# Patient Record
Sex: Female | Born: 1950 | Race: White | Hispanic: No | Marital: Married | Smoking: Never smoker
Health system: Southern US, Community
[De-identification: ages and names within clinical notes are randomized; demographics above are authoritative.]

## PROBLEM LIST (undated history)

## (undated) DIAGNOSIS — H269 Unspecified cataract: Secondary | ICD-10-CM

## (undated) DIAGNOSIS — N8110 Cystocele, unspecified: Secondary | ICD-10-CM

## (undated) DIAGNOSIS — E785 Hyperlipidemia, unspecified: Secondary | ICD-10-CM

## (undated) DIAGNOSIS — I10 Essential (primary) hypertension: Secondary | ICD-10-CM

## (undated) DIAGNOSIS — M1990 Unspecified osteoarthritis, unspecified site: Secondary | ICD-10-CM

## (undated) DIAGNOSIS — Z8489 Family history of other specified conditions: Secondary | ICD-10-CM

## (undated) DIAGNOSIS — H35039 Hypertensive retinopathy, unspecified eye: Secondary | ICD-10-CM

## (undated) DIAGNOSIS — H35341 Macular cyst, hole, or pseudohole, right eye: Secondary | ICD-10-CM

## (undated) HISTORY — DX: Macular cyst, hole, or pseudohole, right eye: H35.341

## (undated) HISTORY — DX: Hypertensive retinopathy, unspecified eye: H35.039

## (undated) HISTORY — DX: Unspecified cataract: H26.9

## (undated) HISTORY — DX: Unspecified osteoarthritis, unspecified site: M19.90

## (undated) HISTORY — PX: CATARACT EXTRACTION: SUR2

## (undated) HISTORY — PX: CHOLECYSTECTOMY: SHX55

## (undated) HISTORY — PX: TUBAL LIGATION: SHX77

## (undated) HISTORY — PX: GALLBLADDER SURGERY: SHX652

## (undated) HISTORY — DX: Cystocele, unspecified: N81.10

## (undated) HISTORY — DX: Hyperlipidemia, unspecified: E78.5

## (undated) HISTORY — DX: Essential (primary) hypertension: I10

---

## 2003-08-29 ENCOUNTER — Ambulatory Visit (HOSPITAL_COMMUNITY): Admission: RE | Admit: 2003-08-29 | Discharge: 2003-08-29 | Payer: Self-pay | Admitting: Family Medicine

## 2004-01-30 ENCOUNTER — Ambulatory Visit: Payer: Self-pay | Admitting: Family Medicine

## 2004-10-07 ENCOUNTER — Other Ambulatory Visit: Admission: RE | Admit: 2004-10-07 | Discharge: 2004-10-07 | Payer: Self-pay | Admitting: Dermatology

## 2004-11-05 ENCOUNTER — Ambulatory Visit: Payer: Self-pay | Admitting: Family Medicine

## 2004-11-05 ENCOUNTER — Other Ambulatory Visit: Admission: RE | Admit: 2004-11-05 | Discharge: 2004-11-05 | Payer: Self-pay | Admitting: Dermatology

## 2004-12-31 ENCOUNTER — Other Ambulatory Visit: Admission: RE | Admit: 2004-12-31 | Discharge: 2004-12-31 | Payer: Self-pay | Admitting: Dermatology

## 2005-03-17 ENCOUNTER — Ambulatory Visit: Payer: Self-pay | Admitting: Family Medicine

## 2005-07-22 ENCOUNTER — Ambulatory Visit: Payer: Self-pay | Admitting: Family Medicine

## 2005-12-01 ENCOUNTER — Ambulatory Visit: Payer: Self-pay | Admitting: Family Medicine

## 2005-12-17 ENCOUNTER — Ambulatory Visit (HOSPITAL_COMMUNITY): Admission: RE | Admit: 2005-12-17 | Discharge: 2005-12-17 | Payer: Self-pay | Admitting: Family Medicine

## 2006-01-06 ENCOUNTER — Ambulatory Visit (HOSPITAL_COMMUNITY): Admission: RE | Admit: 2006-01-06 | Discharge: 2006-01-06 | Payer: Self-pay | Admitting: Family Medicine

## 2006-04-05 ENCOUNTER — Ambulatory Visit: Payer: Self-pay | Admitting: Family Medicine

## 2006-04-23 ENCOUNTER — Ambulatory Visit: Payer: Self-pay | Admitting: Family Medicine

## 2007-01-20 ENCOUNTER — Ambulatory Visit (HOSPITAL_COMMUNITY): Admission: RE | Admit: 2007-01-20 | Discharge: 2007-01-20 | Payer: Self-pay | Admitting: Family Medicine

## 2009-02-04 ENCOUNTER — Encounter: Payer: Self-pay | Admitting: Family Medicine

## 2009-02-04 ENCOUNTER — Ambulatory Visit (HOSPITAL_COMMUNITY): Admission: RE | Admit: 2009-02-04 | Discharge: 2009-02-04 | Payer: Self-pay | Admitting: Family Medicine

## 2010-03-02 ENCOUNTER — Encounter: Payer: Self-pay | Admitting: Family Medicine

## 2010-12-09 ENCOUNTER — Other Ambulatory Visit (HOSPITAL_COMMUNITY): Payer: Self-pay | Admitting: Family Medicine

## 2010-12-09 DIAGNOSIS — Z139 Encounter for screening, unspecified: Secondary | ICD-10-CM

## 2010-12-16 ENCOUNTER — Ambulatory Visit (HOSPITAL_COMMUNITY)
Admission: RE | Admit: 2010-12-16 | Discharge: 2010-12-16 | Disposition: A | Discharge status: Home / Self Care | Payer: BC Managed Care – PPO | Source: Ambulatory Visit | Attending: Family Medicine | Admitting: Family Medicine

## 2010-12-16 DIAGNOSIS — Z1231 Encounter for screening mammogram for malignant neoplasm of breast: Secondary | ICD-10-CM | POA: Insufficient documentation

## 2010-12-16 DIAGNOSIS — Z139 Encounter for screening, unspecified: Secondary | ICD-10-CM

## 2012-06-09 DEATH — deceased

## 2013-08-25 ENCOUNTER — Other Ambulatory Visit (HOSPITAL_COMMUNITY): Payer: Self-pay | Admitting: Family Medicine

## 2013-08-25 DIAGNOSIS — Z1231 Encounter for screening mammogram for malignant neoplasm of breast: Secondary | ICD-10-CM

## 2013-08-29 ENCOUNTER — Other Ambulatory Visit (HOSPITAL_COMMUNITY): Payer: Self-pay | Admitting: Family Medicine

## 2013-08-29 ENCOUNTER — Ambulatory Visit (HOSPITAL_COMMUNITY): Payer: BC Managed Care – PPO

## 2013-08-29 DIAGNOSIS — Z1231 Encounter for screening mammogram for malignant neoplasm of breast: Secondary | ICD-10-CM

## 2013-08-30 ENCOUNTER — Ambulatory Visit (HOSPITAL_COMMUNITY)
Admission: RE | Admit: 2013-08-30 | Discharge: 2013-08-30 | Disposition: A | Discharge status: Home / Self Care | Payer: BC Managed Care – PPO | Source: Ambulatory Visit | Attending: Family Medicine | Admitting: Family Medicine

## 2013-08-30 DIAGNOSIS — Z1231 Encounter for screening mammogram for malignant neoplasm of breast: Secondary | ICD-10-CM | POA: Insufficient documentation

## 2014-01-23 ENCOUNTER — Ambulatory Visit (INDEPENDENT_AMBULATORY_CARE_PROVIDER_SITE_OTHER): Payer: Self-pay | Admitting: Family Medicine

## 2014-01-23 DIAGNOSIS — Z139 Encounter for screening, unspecified: Secondary | ICD-10-CM

## 2014-01-23 DIAGNOSIS — Z024 Encounter for examination for driving license: Secondary | ICD-10-CM

## 2014-01-23 LAB — POCT URINALYSIS DIPSTICK
Bilirubin, UA: NEGATIVE
Blood, UA: NEGATIVE
Glucose, UA: NEGATIVE
Ketones, UA: NEGATIVE
Leukocytes, UA: NEGATIVE
Nitrite, UA: NEGATIVE
Protein, UA: NEGATIVE
Spec Grav, UA: 1.02
Urobilinogen, UA: NEGATIVE
pH, UA: 5

## 2014-01-23 NOTE — Progress Notes (Signed)
   Subjective:    Patient ID: Catherine Carney, female    DOB: 1950-05-01, 63 y.o.   MRN: [id number]  HPI Patient is here for private DOT PE.  She has hx of hypertension  Review of Systems  Constitutional: Negative for fever.  HENT: Negative for ear pain.   Eyes: Negative for discharge.  Respiratory: Negative for cough.   Cardiovascular: Negative for chest pain.  Gastrointestinal: Negative for abdominal distention.  Endocrine: Negative for polyuria.  Genitourinary: Negative for difficulty urinating.  Musculoskeletal: Negative for gait problem and neck pain.  Skin: Negative for color change and rash.  Neurological: Negative for speech difficulty and headaches.  Psychiatric/Behavioral: Negative for agitation.       Objective:    There were no vitals taken for this visit. Physical Exam  Constitutional: She is oriented to person, place, and time. She appears well-developed and well-nourished.  HENT:  Head: Normocephalic and atraumatic.  Mouth/Throat: Oropharynx is clear and moist.  Eyes: Pupils are equal, round, and reactive to light.  Neck: Normal range of motion. Neck supple.  Cardiovascular: Normal rate and regular rhythm.   No murmur heard. Pulmonary/Chest: Effort normal and breath sounds normal.  Abdominal: Soft. Bowel sounds are normal. There is no tenderness.  Neurological: She is alert and oriented to person, place, and time.  Skin: Skin is warm and dry.  Psychiatric: She has a normal mood and affect.          Assessment & Plan:     ICD-9-CM ICD-10-CM   1. Screening V82.9 Z13.9 POCT urinalysis dipstick   3 month temporary card given until bp tx'd and bp under 140/90  No Follow-up on file.  Lysbeth Penner FNP

## 2014-04-16 ENCOUNTER — Encounter: Payer: Self-pay | Admitting: Nurse Practitioner

## 2014-04-16 ENCOUNTER — Ambulatory Visit: Payer: Self-pay | Admitting: Nurse Practitioner

## 2014-04-16 VITALS — BP 139/83 | HR 98 | Ht 66.0 in | Wt 204.0 lb

## 2014-04-16 DIAGNOSIS — Z024 Encounter for examination for driving license: Secondary | ICD-10-CM

## 2014-04-16 NOTE — Progress Notes (Signed)
DOT physical- see scanned in report   [place], FNP

## 2014-07-23 ENCOUNTER — Other Ambulatory Visit (HOSPITAL_COMMUNITY): Payer: Self-pay | Admitting: Family Medicine

## 2014-07-23 DIAGNOSIS — Z1231 Encounter for screening mammogram for malignant neoplasm of breast: Secondary | ICD-10-CM

## 2014-09-03 ENCOUNTER — Ambulatory Visit (HOSPITAL_COMMUNITY)
Admission: RE | Admit: 2014-09-03 | Discharge: 2014-09-03 | Disposition: A | Discharge status: Home / Self Care | Payer: BLUE CROSS/BLUE SHIELD | Source: Ambulatory Visit | Attending: Family Medicine | Admitting: Family Medicine

## 2014-09-03 DIAGNOSIS — Z1231 Encounter for screening mammogram for malignant neoplasm of breast: Secondary | ICD-10-CM

## 2014-09-20 ENCOUNTER — Other Ambulatory Visit (HOSPITAL_COMMUNITY): Payer: Self-pay | Admitting: Family Medicine

## 2014-09-20 DIAGNOSIS — M810 Age-related osteoporosis without current pathological fracture: Secondary | ICD-10-CM

## 2014-09-24 ENCOUNTER — Other Ambulatory Visit (HOSPITAL_COMMUNITY): Payer: BLUE CROSS/BLUE SHIELD

## 2014-10-11 ENCOUNTER — Ambulatory Visit (HOSPITAL_COMMUNITY)
Admission: RE | Admit: 2014-10-11 | Discharge: 2014-10-11 | Disposition: A | Discharge status: Home / Self Care | Payer: BLUE CROSS/BLUE SHIELD | Source: Ambulatory Visit | Attending: Family Medicine | Admitting: Family Medicine

## 2014-10-11 DIAGNOSIS — Z780 Asymptomatic menopausal state: Secondary | ICD-10-CM | POA: Diagnosis not present

## 2014-10-11 DIAGNOSIS — M8580 Other specified disorders of bone density and structure, unspecified site: Secondary | ICD-10-CM | POA: Diagnosis present

## 2014-10-11 DIAGNOSIS — M810 Age-related osteoporosis without current pathological fracture: Secondary | ICD-10-CM

## 2014-10-11 DIAGNOSIS — M8588 Other specified disorders of bone density and structure, other site: Secondary | ICD-10-CM | POA: Insufficient documentation

## 2014-11-06 ENCOUNTER — Other Ambulatory Visit (HOSPITAL_COMMUNITY): Payer: Self-pay | Admitting: Physician Assistant

## 2014-11-06 DIAGNOSIS — Z1231 Encounter for screening mammogram for malignant neoplasm of breast: Secondary | ICD-10-CM

## 2014-11-15 ENCOUNTER — Ambulatory Visit (HOSPITAL_COMMUNITY): Payer: BLUE CROSS/BLUE SHIELD

## 2015-04-09 ENCOUNTER — Encounter: Payer: Self-pay | Admitting: Family Medicine

## 2015-04-09 ENCOUNTER — Ambulatory Visit: Payer: Self-pay | Admitting: Family Medicine

## 2015-04-09 VITALS — BP 136/73 | HR 86 | Temp 97.8°F | Ht 66.0 in | Wt 193.4 lb

## 2015-04-09 DIAGNOSIS — Z139 Encounter for screening, unspecified: Secondary | ICD-10-CM

## 2015-04-09 DIAGNOSIS — Z024 Encounter for examination for driving license: Secondary | ICD-10-CM

## 2015-04-09 LAB — POCT URINALYSIS DIPSTICK
Bilirubin, UA: NEGATIVE
Blood, UA: NEGATIVE
GLUCOSE UA: NEGATIVE
KETONES UA: NEGATIVE
LEUKOCYTES UA: NEGATIVE
Nitrite, UA: NEGATIVE
PROTEIN UA: NEGATIVE
Spec Grav, UA: <=1.005
Urobilinogen, UA: NEGATIVE
pH, UA: 5

## 2015-04-09 NOTE — Progress Notes (Signed)
Subjective:  Patient ID: Catherine Carney, female    DOB: 02/01/1951  Age: 65 y.o. MRN: [id number]  CC: DOT PE   HPI Catherine Carney presents for DOT exam   History Adam has no past medical history on file.   She has no past surgical history on file.   Her family history is not on file.She reports that she has never smoked. She does not have any smokeless tobacco history on file. Her alcohol and drug histories are not on file.    ROS Review of Systems  Constitutional: Negative for fever, chills, diaphoresis, appetite change, fatigue and unexpected weight change.  HENT: Negative for congestion, ear pain, hearing loss, postnasal drip, rhinorrhea, sneezing, sore throat and trouble swallowing.   Eyes: Negative for pain.  Respiratory: Negative for cough, chest tightness and shortness of breath.   Cardiovascular: Negative for chest pain and palpitations.  Gastrointestinal: Negative for nausea, vomiting, abdominal pain, diarrhea and constipation.  Endocrine: Negative for cold intolerance, heat intolerance, polydipsia, polyphagia and polyuria.  Genitourinary: Negative for dysuria, frequency and menstrual problem.  Musculoskeletal: Negative for joint swelling and arthralgias.  Skin: Negative for rash.  Allergic/Immunologic: Negative for environmental allergies.  Neurological: Negative for dizziness, weakness, numbness and headaches.  Psychiatric/Behavioral: Negative for dysphoric mood and agitation.    Objective:  BP 136/73 mmHg  Pulse 86  Temp(Src) 97.8 F (36.6 C) (Oral)  Ht 5\' 6"  (1.676 m)  Wt 193 lb 6.4 oz (87.726 kg)  BMI 31.23 kg/m2  SpO2 99%  BP Readings from Last 3 Encounters:  04/09/15 136/73  04/16/14 139/83    Wt Readings from Last 3 Encounters:  04/09/15 193 lb 6.4 oz (87.726 kg)  04/16/14 204 lb (92.534 kg)     Physical Exam  Constitutional: She is oriented to person, place, and time. She appears well-developed and well-nourished. No distress.  HENT:    Head: Normocephalic and atraumatic.  Right Ear: External ear normal.  Left Ear: External ear normal.  Nose: Nose normal.  Mouth/Throat: Oropharynx is clear and moist.  Eyes: Conjunctivae and EOM are normal. Pupils are equal, round, and reactive to light.  Neck: Normal range of motion. Neck supple. No thyromegaly present.  Cardiovascular: Normal rate, regular rhythm and normal heart sounds.   No murmur heard. Pulmonary/Chest: Effort normal and breath sounds normal. No respiratory distress. She has no wheezes. She has no rales.  Abdominal: Soft. Bowel sounds are normal. She exhibits no distension. There is no tenderness.  Lymphadenopathy:    She has no cervical adenopathy.  Neurological: She is alert and oriented to person, place, and time. She has normal reflexes.  Skin: Skin is warm and dry.  Psychiatric: She has a normal mood and affect. Her behavior is normal. Judgment and thought content normal.     No results found for: WBC, HGB, HCT, PLT, GLUCOSE, CHOL, TRIG, HDL, LDLDIRECT, LDLCALC, ALT, AST, NA, K, CL, CREATININE, BUN, CO2, TSH, PSA, INR, GLUF, HGBA1C, MICROALBUR  Dg Bone Density  10/11/2014  EXAM: DUAL X-RAY ABSORPTIOMETRY (DXA) FOR BONE MINERAL DENSITY IMPRESSION: Ordering Physician:  Dr. Dione Housekeeper, Your patient Catherine Carney completed a BMD test on 10/11/2014 using the [place] (software version: 14.10) manufactured by [employer]. The following summarizes the results of our evaluation. PATIENT BIOGRAPHICAL: Name: Catherine Carney, Catherine Carney Patient ID: [id number] Birth Date: 1950/12/01 Height: 65.5 in. Gender: Female Exam Date: 10/11/2014 Weight: 197.0 lbs. Indications: Caucasian, Follow up Osteopenia, Height Loss, History of Fracture (Adult), Parent  Hip Fracture, Post Menopausal Fractures: Wrist Treatments: Vitamin D, Calcium DENSITOMETRY RESULTS: Site      Region    Measured Date Measured Age WHO Classification Young Adult T-score BMD         %Change vs. Previous  Significant Change (*) AP Spine L1-L2 10/11/2014 66.4 Normal -0.6 1.098 g/cm2 20.7% Yes AP Spine L1-L2 02/04/2009 60.7 Osteopenia -2.1 0.910 g/cm2 -8.6% Yes AP Spine L1-L2 08/29/2003 55.3 Osteopenia -1.4 0.996 g/cm2 - - DualFemur Neck Left 10/11/2014 66.4 Osteopenia -1.8 0.785 g/cm2 -1.6% - DualFemur Neck Left 02/04/2009 60.7 Osteopenia -1.7 0.798 g/cm2 1.1% - DualFemur Neck Left 08/29/2003 55.3 Osteopenia -1.8 0.789 g/cm2 - - ASSESSMENT: BMD as determined from Femur Neck Left is 0.785 g/cm2 with a T-Score of -1.8. This patient is considered osteopenic according to [facility]) criteria. Compared with the prior study on 02/04/2009, the BMD of the lumbar spine shows a statistically significant increase. Compared with the prior study on 02/04/2009, the BMD of the left femoral neck shows no statistically significant change. (L-3-4 was excluded due to advanced degenerative changes.) (Patient does not meet criteria for FRAX assessment.) World Health Organization [facility]) criteria for post-menopausal, Caucasian Women: Normal:       T-score at or above -1 SD Osteopenia:   T-score between -1 and -2.5 SD Osteoporosis: T-score at or below -2.5 SD RECOMMENDATIONS: [place] recommends that FDA-approved medial therapies be considered in postmenopausal women and men age 74 or older with a: 1. Hip or vertebral (clinical or morphometric) fracture. 2. T-Score of < -2.5 at the spine or hip. 3. Ten-year fracture probability by FRAX of 3% or greater for hip fracture or 20% or greater for major osteoporotic fracture. All treatment decisions require clinical judgment and consideration of individual patient factors, including patient preferences, co-morbidities, previous drug use, risk factors not captured in the FRAX model (e.g. falls, vitamin D deficiency, increased bone turnover, interval significant decline in bone density) and possible under-or over-estimation of fracture risk by FRAX. All  patients should ensure an adequate intake of dietary calcium (1200 mg/d) and vitamin D (800 IU daily) unless contraindicated. FOLLOW-UP: People with diagnosed cases of osteoporosis or osteopenia should be regularly tested for bone mineral density. For patients eligible for Medicare, routine testing is allowed once every 2 years. Testing frequency can be increased for patients who have rapidly progressing disease, or for those who are receiving medical therapy to restore bone mass. I have reviewed this report, and agree with the above findings. [facility] Electronically Signed   By: David  Martinique M.D.   On: 10/11/2014 10:38    Assessment & Plan:   Dariona was seen today for dot pe.  Diagnoses and all orders for this visit:  Encounter for commercial driver medical examination (CDME)  Screening -     POCT urinalysis dipstick      I have discontinued Ms. Kienle's alendronate and PRENAT-FECBN-FEBISG-FA-FISHOIL PO. I am also having her maintain her amLODipine, aspirin, Calcium-Magnesium-Vitamin D, lisinopril, THERA, and Pitavastatin Calcium.  No orders of the defined types were placed in this encounter.     Follow-up: No Follow-up on file.  Claretta Fraise, M.D.

## 2016-01-23 ENCOUNTER — Other Ambulatory Visit (HOSPITAL_COMMUNITY): Payer: Self-pay | Admitting: Family Medicine

## 2016-01-23 DIAGNOSIS — Z1231 Encounter for screening mammogram for malignant neoplasm of breast: Secondary | ICD-10-CM

## 2016-01-30 ENCOUNTER — Ambulatory Visit (HOSPITAL_COMMUNITY)
Admission: RE | Admit: 2016-01-30 | Discharge: 2016-01-30 | Disposition: A | Discharge status: Home / Self Care | Payer: BLUE CROSS/BLUE SHIELD | Source: Ambulatory Visit | Attending: Family Medicine | Admitting: Family Medicine

## 2016-01-30 DIAGNOSIS — Z1231 Encounter for screening mammogram for malignant neoplasm of breast: Secondary | ICD-10-CM | POA: Insufficient documentation

## 2016-04-02 ENCOUNTER — Encounter: Payer: Self-pay | Admitting: Family Medicine

## 2016-04-02 ENCOUNTER — Ambulatory Visit (INDEPENDENT_AMBULATORY_CARE_PROVIDER_SITE_OTHER): Payer: Self-pay | Admitting: Family Medicine

## 2016-04-02 VITALS — BP 137/73 | HR 96 | Ht 65.0 in | Wt 191.0 lb

## 2016-04-02 DIAGNOSIS — Z024 Encounter for examination for driving license: Secondary | ICD-10-CM

## 2016-04-02 NOTE — Progress Notes (Signed)
Subjective:  Patient ID: Catherine Carney, female    DOB: Jun 24, 1950  Age: 66 y.o. 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   She has no past surgical history on file.   Her family history is not on file.She reports that she has never smoked. She does not have any smokeless tobacco history on file. Her alcohol and drug histories are not on file.    ROS Review of Systems  Constitutional: Negative for activity change, appetite change and fever.  HENT: Negative for congestion, rhinorrhea and sore throat.   Eyes: Negative for visual disturbance.  Respiratory: Negative for cough and shortness of breath.   Cardiovascular: Negative for chest pain and palpitations.  Gastrointestinal: Negative for abdominal pain, diarrhea and nausea.  Genitourinary: Negative for dysuria.  Musculoskeletal: Negative for arthralgias and myalgias.    Objective:  BP 137/73   Pulse 96   Ht 5\' 5"  (1.651 m)   Wt 191 lb (86.6 kg)   BMI 31.78 kg/m   BP Readings from Last 3 Encounters:  04/02/16 137/73  04/09/15 136/73  04/16/14 139/83    Wt Readings from Last 3 Encounters:  04/02/16 191 lb (86.6 kg)  04/09/15 193 lb 6.4 oz (87.7 kg)  04/16/14 204 lb (92.5 kg)     Physical Exam  Constitutional: She is oriented to person, place, and time. She appears well-developed and well-nourished. No distress.  HENT:  Head: Normocephalic and atraumatic.  Eyes: Conjunctivae are normal. Pupils are equal, round, and reactive to light.  Neck: Normal range of motion. Neck supple. No thyromegaly present.  Cardiovascular: Normal rate, regular rhythm and normal heart sounds.   No murmur heard. Pulmonary/Chest: Effort normal and breath sounds normal. No respiratory distress. She has no wheezes. She has no rales.  Abdominal: Soft. Bowel sounds are normal. She exhibits no distension. There is no  tenderness.  Musculoskeletal: Normal range of motion.  Lymphadenopathy:    She has no cervical adenopathy.  Neurological: She is alert and oriented to person, place, and time.  Skin: Skin is warm and dry.  Psychiatric: She has a normal mood and affect. Her behavior is normal. Judgment and thought content normal.    Mm Screening Breast Tomo Bilateral  Result Date: 01/30/2016 CLINICAL DATA:  Screening. EXAM: 2D DIGITAL SCREENING BILATERAL MAMMOGRAM WITH CAD AND ADJUNCT TOMO COMPARISON:  Previous exam(s). ACR Breast Density Category c: The breast tissue is heterogeneously dense, which may obscure small masses. FINDINGS: There are no findings suspicious for malignancy. Images were processed with CAD. IMPRESSION: No mammographic evidence of malignancy. A result letter of this screening mammogram will be mailed directly to the patient. RECOMMENDATION: Screening mammogram in one year. (Code:SM-B-01Y) BI-RADS CATEGORY  1: Negative. Electronically Signed   By: Nolon Nations M.D.   On: 02/05/2016 08:45    Assessment & Plan:   Catherine Carney was seen today for private dot physical.  Diagnoses and all orders for this visit:  Encounter for commercial driver medical examination (CDME)      I am having Catherine Carney maintain her amLODipine, aspirin, Calcium-Magnesium-Vitamin D, lisinopril, THERA, and Pitavastatin Calcium.  Allergies as of 04/02/2016   No Known Allergies     Medication List       Accurate as of 04/02/16  6:04 PM. Always use your most recent med list.  amLODipine 5 MG tablet Commonly known as:  NORVASC TAKE 1 TABLET DAILY   aspirin 81 MG chewable tablet Chew 81 mg by mouth.   Calcium-Magnesium-Vitamin D 600-40-500 MG-MG-UNIT Tb24 Take by mouth.   lisinopril 10 MG tablet Commonly known as:  PRINIVIL,ZESTRIL Take 10 mg by mouth.   LIVALO 2 MG Tabs Generic drug:  Pitavastatin Calcium Take 1 tablet by mouth.   THERA Tabs Take 1 tablet by mouth.         Follow-up: Return in about 1 year (around 04/02/2017).  Catherine Carney, M.D.

## 2016-10-02 ENCOUNTER — Encounter: Payer: Self-pay | Admitting: *Deleted

## 2016-10-06 ENCOUNTER — Encounter (INDEPENDENT_AMBULATORY_CARE_PROVIDER_SITE_OTHER): Payer: Self-pay

## 2016-10-06 ENCOUNTER — Encounter: Payer: Self-pay | Admitting: Obstetrics & Gynecology

## 2016-10-06 ENCOUNTER — Ambulatory Visit (INDEPENDENT_AMBULATORY_CARE_PROVIDER_SITE_OTHER): Payer: Medicare HMO | Admitting: Obstetrics & Gynecology

## 2016-10-06 VITALS — BP 130/70 | HR 84 | Ht 66.0 in | Wt 192.5 lb

## 2016-10-06 DIAGNOSIS — N813 Complete uterovaginal prolapse: Secondary | ICD-10-CM

## 2016-10-06 NOTE — Progress Notes (Signed)
Chief Complaint  Patient presents with  . feels like bladder has dropped    Blood pressure 130/70, pulse 84, height 5\' 6"  (1.676 m), weight 192 lb 8 oz (87.3 kg).  66 y.o. G3P3003 No LMP recorded. Patient is postmenopausal. The current method of family planning is post menopausal status.  Outpatient Encounter Prescriptions as of 10/06/2016  Medication Sig Note  . amLODipine (NORVASC) 5 MG tablet Take 10 mg by mouth daily. TAKE 1 TABLET DAILY 01/23/2014: Received from: [facility]  . aspirin 81 MG chewable tablet Chew 81 mg by mouth daily.  01/23/2014: Received from: [place]  . CALCIUM PO Take by mouth daily.   Marland Kitchen lisinopril (PRINIVIL,ZESTRIL) 10 MG tablet Take 20 mg by mouth daily.  01/23/2014: Received from: [facility]  . Multiple Vitamin (THERA) TABS Take 1 tablet by mouth daily.  01/23/2014: Received from: [facility]  . Omega-3 Fatty Acids (FISH OIL PO) Take 1,000 mg by mouth daily.   . Pitavastatin Calcium (LIVALO) 2 MG TABS Take 1 tablet by mouth daily.  01/23/2014: Received from: [place]  . vitamin B-12 (CYANOCOBALAMIN) 100 MCG tablet Take 100 mcg by mouth daily.   . [DISCONTINUED] Calcium-Magnesium-Vitamin D 614-43-154 MG-MG-UNIT TB24 Take by mouth. 01/23/2014: Received from: [place]   No facility-administered encounter medications on file as of 10/06/2016.     Subjective Catherine Carney comes in stating feels like her bladder has dropped This is been going on for some time weeks to months but she is basically been ignoring it is best she can It certainly is worse with movement and straining She has no urinary incontinence She denies any bleeding Nothing seems to make it worse or better   Objective Blood pressure 130/70, pulse 84, height 5\' 6"  (1.676 m), weight 192 lb 8 oz (87.3 kg).  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 Omega-3 Fatty Acids (FISH OIL PO)    Sig: Take 1,000 mg by mouth daily.  Marland Kitchen CALCIUM PO    Sig: Take by mouth daily.    Labs or Scans Ordered: No orders of the defined types were placed in this encounter.   Management:: Milex ring with support #5  Follow up Return in about 9 days (around 10/15/2016) for Follow up, with Dr Elonda Husky.      All questions were answered.  Past Medical History:  Diagnosis Date  . Female bladder prolapse   . Hyperlipidemia   . Hypertension   . Osteoarthritis     Past Surgical History:  Procedure Laterality Date  . GALLBLADDER SURGERY    . TUBAL LIGATION      OB History    Gravida Para Term Preterm AB Living   3 3 3     3    SAB  TAB Ectopic Multiple Live Births           3      No Known Allergies  Social History   Social History  . Marital status: Married    Spouse name: N/A  . Number of children: N/A  . Years of education: N/A   Social History Main Topics  . Smoking status: Never Smoker  . Smokeless tobacco: Never Used  . Alcohol use No  . Drug use: No  . Sexual activity: Not Currently    Birth control/ protection: Surgical     Comment: tubal   Other Topics Concern  . None   Social History Narrative  . None    Family History  Problem Relation Age of Onset  . Heart attack Maternal Grandmother   . Cancer Maternal Grandfather   . Cancer Father         lung  . Other Mother        irregular heart beat  . Hypertension Mother   . Diabetes Brother   . Hypertension Brother   . High Cholesterol Brother   . Atrial fibrillation Brother   . Hypertension Sister   . High Cholesterol Sister   . Colon cancer Sister   . Rheum arthritis Sister   . Other Sister        irregular heart beat  . Hypertension Son   . Cancer Other        maternal side  . Stroke Other        maternal side

## 2016-10-15 ENCOUNTER — Encounter: Payer: Self-pay | Admitting: Obstetrics & Gynecology

## 2016-10-15 ENCOUNTER — Ambulatory Visit (INDEPENDENT_AMBULATORY_CARE_PROVIDER_SITE_OTHER): Payer: Medicare HMO | Admitting: Obstetrics & Gynecology

## 2016-10-15 VITALS — BP 100/60 | HR 76 | Wt 192.0 lb

## 2016-10-15 DIAGNOSIS — N813 Complete uterovaginal prolapse: Secondary | ICD-10-CM | POA: Diagnosis not present

## 2016-10-15 NOTE — Progress Notes (Signed)
Chief Complaint  Patient presents with  . Follow-up    Blood pressure 100/60, pulse 76, weight 192 lb (87.1 kg).  66 y.o. G3P3003 No LMP recorded. Patient is postmenopausal. The current method of family planning is post menopausal status.  Outpatient Encounter Prescriptions as of 10/15/2016  Medication Sig Note  . amLODipine (NORVASC) 5 MG tablet Take 10 mg by mouth daily. TAKE 1 TABLET DAILY 01/23/2014: Received from: [facility]  . aspirin 81 MG chewable tablet Chew 81 mg by mouth daily.  01/23/2014: Received from: [place]  . CALCIUM PO Take by mouth daily.   Marland Kitchen lisinopril (PRINIVIL,ZESTRIL) 10 MG tablet Take 20 mg by mouth daily.  01/23/2014: Received from: [place]  . Multiple Vitamin (THERA) TABS Take 1 tablet by mouth daily.  01/23/2014: Received from: [place]  . Omega-3 Fatty Acids (FISH OIL PO) Take 1,000 mg by mouth daily.   . Pitavastatin Calcium (LIVALO) 2 MG TABS Take 1 tablet by mouth daily.  01/23/2014: Received from: [place]  . vitamin B-12 (CYANOCOBALAMIN) 100 MCG tablet Take 100 mcg by mouth daily.    No facility-administered encounter medications on file as of 10/15/2016.     Subjective GEN is in today for her pessary Saw her last week and fit her for a Milex ring with support #5 for complete grade 4 uterine procidentia Interestingly she has good rectal support and I think her bladder is only being pulled down by her uterus primarily, if I support her uterus her bladder stays well supported Objective  complete uterine procidentia  Pertinent ROS No burning with urination, frequency or urgency No nausea, vomiting or diarrhea Nor fever chills or other constitutional symptoms   Labs or studies No new    Impression Diagnoses this Encounter::   ICD-10-CM   1. 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   Labs or  Scans Ordered: No orders of the defined types were placed in this encounter.   Management:: Milex ring support #5 placed day I will see patient back in 1 month for follow-up or as needed  Follow up Return in about 1 month (around 11/14/2016) for Follow up, with Dr Elonda Husky.        Face to face time:  10 minutes  Greater than 50% of the visit time was spent in counseling and coordination of care with the patient.  The summary and outline of the counseling and care coordination is summarized in the note above.   All questions were answered.  Past Medical History:  Diagnosis Date  . Female bladder prolapse   . Hyperlipidemia   . Hypertension   . Osteoarthritis     Past Surgical History:  Procedure Laterality Date  . GALLBLADDER SURGERY    . TUBAL LIGATION      OB History    Gravida Para Term Preterm AB Living   3 3 3     3    SAB TAB Ectopic Multiple Live Births           3      No Known Allergies  Social History   Social History  . Marital status: Married    Spouse name: N/A  . Number of children: N/A  . Years of education: N/A   Social History Main Topics  . Smoking status: Never Smoker  . Smokeless tobacco: Never Used  . Alcohol use  No  . Drug use: No  . Sexual activity: Not Currently    Birth control/ protection: Surgical     Comment: tubal   Other Topics Concern  . None   Social History Narrative  . None    Family History  Problem Relation Age of Onset  . Heart attack Maternal Grandmother   . Cancer Maternal Grandfather   . Cancer Father        lung  . Other Mother        irregular heart beat  . Hypertension Mother   . Diabetes Brother   . Hypertension Brother   . High Cholesterol Brother   . Atrial fibrillation Brother   . Hypertension Sister   . High Cholesterol Sister   . Colon cancer Sister   . Rheum arthritis Sister   . Other Sister        irregular heart beat  . Hypertension Son   . Cancer Other        maternal side  .  Stroke Other        maternal side

## 2016-11-13 ENCOUNTER — Ambulatory Visit (INDEPENDENT_AMBULATORY_CARE_PROVIDER_SITE_OTHER): Payer: Medicare HMO | Admitting: Obstetrics & Gynecology

## 2016-11-13 ENCOUNTER — Encounter: Payer: Self-pay | Admitting: Obstetrics & Gynecology

## 2016-11-13 VITALS — BP 120/72 | HR 53 | Ht 66.0 in | Wt 192.0 lb

## 2016-11-13 DIAGNOSIS — Z4689 Encounter for fitting and adjustment of other specified devices: Secondary | ICD-10-CM | POA: Diagnosis not present

## 2016-11-13 DIAGNOSIS — N813 Complete uterovaginal prolapse: Secondary | ICD-10-CM

## 2016-11-13 NOTE — Progress Notes (Signed)
Patient ID: Catherine Carney, female   DOB: [birth year], 66 y.o.   MRN: [id number]     Chief Complaint  Patient presents with  . Follow-up    pessary    Blood pressure 120/72, pulse (!) 53, height 5\' 6"  (1.676 m), weight 192 lb (87.1 kg).   Catherine Carney is in for her follow-up visit today She is a 66 year old G19P3003 who I saw originally in August 2018 and made the diagnosis of complete uterine procidentia I fit her for a Milex ring with support #5 While this is not the ideal pessary certainly preferred if it works over the [employer] Over the past month [name] states it is doing great she is doing much better and much more comfortable than she has been As I would expect occasionally she will feel it sort of moved down probably related to lifting her bowel movements or voiding and I instructed her just to push it back up and diet should be fine I think she probably could handle a 6 if this becomes a bigger problem but I would prefer to stay with the 5 at the present She has had no problems with vaginal discharge or bleeding  On exam today it is seated well in the vagina It is removed and cleaned The vaginal mucosa is intact with no evidence of undue pressure and there is no discharge or bleeding appreciated with removal After the cleaning the pessary is replaced without difficulty and the patient is given sort of a shortened service and had a when sure it stays in place  She'll be seen back in 3 months for ongoing pessary maintenance earlier if needed should problems arise  Return in about 3 months (around 02/13/2017) for Pessary maintainence, with Dr Elonda Husky.

## 2017-01-12 ENCOUNTER — Encounter: Payer: Self-pay | Admitting: Obstetrics & Gynecology

## 2017-01-12 ENCOUNTER — Ambulatory Visit: Payer: Medicare HMO | Admitting: Obstetrics & Gynecology

## 2017-01-12 ENCOUNTER — Other Ambulatory Visit: Payer: Self-pay

## 2017-01-12 VITALS — BP 142/70 | HR 77 | Ht 66.0 in | Wt 187.0 lb

## 2017-01-12 DIAGNOSIS — B9689 Other specified bacterial agents as the cause of diseases classified elsewhere: Secondary | ICD-10-CM

## 2017-01-12 DIAGNOSIS — N813 Complete uterovaginal prolapse: Secondary | ICD-10-CM | POA: Diagnosis not present

## 2017-01-12 DIAGNOSIS — N760 Acute vaginitis: Secondary | ICD-10-CM

## 2017-01-12 DIAGNOSIS — Z4689 Encounter for fitting and adjustment of other specified devices: Secondary | ICD-10-CM | POA: Diagnosis not present

## 2017-01-12 NOTE — Progress Notes (Signed)
Catherine Carney comes in today for a concern over some vaginal spotting and discharge She states the pessary is doing fine as we have stated before it does have a little extra motion and it because it is not the perfect pessary for her Additionally she will be considering whether or not to have the surgery in the future but at this point because of life issues will delay it She does not report any vaginal odor and no heavy bleeding and really no significant pain or discomfort On occasion when it wiggle she does have to sort of push it back up which I had told her would probably happen  Past Medical History:  Diagnosis Date  . Female bladder prolapse   . Hyperlipidemia   . Hypertension   . Osteoarthritis     Past Surgical History:  Procedure Laterality Date  . GALLBLADDER SURGERY    . TUBAL LIGATION      OB History    Gravida Para Term Preterm AB Living   3 3 3     3    SAB TAB Ectopic Multiple Live Births           3      No Known Allergies  Social History   Socioeconomic History  . Marital status: Married    Spouse name: None  . Number of children: None  . Years of education: None  . Highest education level: None  Social Needs  . Financial resource strain: None  . Food insecurity - worry: None  . Food insecurity - inability: None  . Transportation needs - medical: None  . Transportation needs - non-medical: None  Occupational History  . None  Tobacco Use  . Smoking status: Never Smoker  . Smokeless tobacco: Never Used  Substance and Sexual Activity  . Alcohol use: No    Alcohol/week: 0.0 oz  . Drug use: No  . Sexual activity: Not Currently    Birth control/protection: Surgical    Comment: tubal  Other Topics Concern  . None  Social History Narrative  . None    Family History  Problem Relation Age of Onset  . Heart attack Maternal Grandmother   . Cancer Maternal Grandfather   . Cancer Father        lung  . Other Mother        irregular heart beat  .  Hypertension Mother   . Diabetes Brother   . Hypertension Brother   . High Cholesterol Brother   . Atrial fibrillation Brother   . Hypertension Sister   . High Cholesterol Sister   . Colon cancer Sister   . Rheum arthritis Sister   . Other Sister        irregular heart beat  . Hypertension Son   . Cancer Other        maternal side  . Stroke Other        maternal side   ROS No burning with urination, frequency or urgency No nausea, vomiting or diarrhea Nor fever chills or other constitutional symptoms  Blood pressure (!) 142/70, pulse 77, height 5\' 6"  (1.676 m), weight 187 lb (84.8 kg).  On exam today she has normal external genitalia there is no blood evident and no evidence of mucosal abnormality The pessary is removed without difficulty and has minimal discharge on it and no blood is seen The pessary is cleaned with warm water and soap On vaginal exam there is no undue vaginal mucosal breakdown and no blood  that is evident as a result of that  My impression is this represents some vaginal mucosal irritation and probably a mild bacterial vaginosis  The pessary was replaced in the vagina without difficulty with good seating and is comfortable Imp  BV with pessary in place  Plan Patient will be treated with MetroGel vaginal cream every every other night for 5 applications over the next 10 nights Otherwise I will see her back for routine pessary maintenance in 3 months or earlier if need be

## 2017-01-13 ENCOUNTER — Telehealth: Payer: Self-pay | Admitting: *Deleted

## 2017-01-13 ENCOUNTER — Telehealth: Payer: Self-pay | Admitting: Obstetrics & Gynecology

## 2017-01-13 MED ORDER — METRONIDAZOLE 0.75 % VA GEL: VAGINAL | 0 refills | Status: DC

## 2017-01-14 NOTE — Telephone Encounter (Signed)
Meds ordered this encounter  Medications  . metroNIDAZOLE (METROGEL VAGINAL) 0.75 % vaginal gel    Sig: Nightly x 5 nights    Dispense:  70 g    Refill:  0    

## 2017-01-14 NOTE — Telephone Encounter (Signed)
Informed patient prescription was sent to pharmacy. Stated she picked up last night.

## 2017-02-11 ENCOUNTER — Other Ambulatory Visit (HOSPITAL_COMMUNITY): Payer: Self-pay | Admitting: Family Medicine

## 2017-02-11 DIAGNOSIS — Z1231 Encounter for screening mammogram for malignant neoplasm of breast: Secondary | ICD-10-CM

## 2017-02-11 DIAGNOSIS — M816 Localized osteoporosis [Lequesne]: Secondary | ICD-10-CM

## 2017-02-15 ENCOUNTER — Ambulatory Visit: Payer: Medicare HMO | Admitting: Obstetrics & Gynecology

## 2017-02-17 ENCOUNTER — Ambulatory Visit (HOSPITAL_COMMUNITY)
Admission: RE | Admit: 2017-02-17 | Discharge: 2017-02-17 | Disposition: A | Discharge status: Home / Self Care | Payer: Medicare HMO | Source: Ambulatory Visit | Attending: Family Medicine | Admitting: Family Medicine

## 2017-02-17 DIAGNOSIS — Z780 Asymptomatic menopausal state: Secondary | ICD-10-CM | POA: Insufficient documentation

## 2017-02-17 DIAGNOSIS — M816 Localized osteoporosis [Lequesne]: Secondary | ICD-10-CM

## 2017-02-17 DIAGNOSIS — Z1231 Encounter for screening mammogram for malignant neoplasm of breast: Secondary | ICD-10-CM | POA: Insufficient documentation

## 2017-02-17 DIAGNOSIS — M85852 Other specified disorders of bone density and structure, left thigh: Secondary | ICD-10-CM | POA: Insufficient documentation

## 2017-03-26 ENCOUNTER — Encounter: Payer: Self-pay | Admitting: Family Medicine

## 2017-03-26 ENCOUNTER — Ambulatory Visit: Payer: Self-pay | Admitting: Family Medicine

## 2017-03-26 VITALS — BP 125/72 | HR 90 | Ht 66.0 in | Wt 190.0 lb

## 2017-03-26 DIAGNOSIS — Z024 Encounter for examination for driving license: Secondary | ICD-10-CM

## 2017-03-26 NOTE — Progress Notes (Signed)
Subjective:  Patient ID: Catherine Carney, female    DOB: 03/01/1950  Age: 67 y.o. MRN: [id number]  CC: Private DOT Physical   HPI MERIAN WROE presents for DOT exam  Depression screen [facility] 2/9 03/26/2017 10/06/2016 04/09/2015  Decreased Interest 0 0 0  Down, Depressed, Hopeless 0 0 0  PHQ - 2 Score 0 0 0    History Evola has a past medical history of Female bladder prolapse, Hyperlipidemia, Hypertension, and Osteoarthritis.   She has a past surgical history that includes Tubal ligation and Gallbladder surgery.   Her family history includes Atrial fibrillation in her brother; Cancer in her father, maternal grandfather, and other; Colon cancer in her sister; Diabetes in her brother; Heart attack in her maternal grandmother; High Cholesterol in her brother and sister; Hypertension in her brother, mother, sister, and son; Other in her mother and sister; Rheum arthritis in her sister; Stroke in her other.She reports that  has never smoked. she has never used smokeless tobacco. She reports that she does not drink alcohol or use drugs.    ROS Review of Systems  Constitutional: Negative for activity change, appetite change and fever.  HENT: Negative for congestion, rhinorrhea and sore throat.   Eyes: Negative for visual disturbance.  Respiratory: Negative for cough and shortness of breath.   Cardiovascular: Negative for chest pain and palpitations.  Gastrointestinal: Negative for abdominal pain, diarrhea and nausea.  Genitourinary: Negative for dysuria.  Musculoskeletal: Negative for arthralgias and myalgias.    Objective:  BP 125/72   Pulse 90   Ht 5\' 6"  (1.676 m)   Wt 190 lb (86.2 kg)   BMI 30.67 kg/m   BP Readings from Last 3 Encounters:  03/26/17 125/72  01/12/17 (!) 142/70  11/13/16 120/72    Wt Readings from Last 3 Encounters:  03/26/17 190 lb (86.2 kg)  01/12/17 187 lb (84.8 kg)  11/13/16 192 lb (87.1 kg)     Physical Exam  Constitutional: She is oriented to  person, place, and time. She appears well-developed and well-nourished. No distress.  HENT:  Head: Normocephalic and atraumatic.  Right Ear: External ear normal.  Left Ear: External ear normal.  Nose: Nose normal.  Mouth/Throat: Oropharynx is clear and moist.  Eyes: Conjunctivae and EOM are normal. Pupils are equal, round, and reactive to light.  Neck: Normal range of motion. Neck supple. No thyromegaly present.  Cardiovascular: Normal rate, regular rhythm and normal heart sounds.  No murmur heard. Pulmonary/Chest: Effort normal and breath sounds normal. No respiratory distress. She has no wheezes. She has no rales.  Abdominal: Soft. Bowel sounds are normal. She exhibits no distension. There is no tenderness.  Lymphadenopathy:    She has no cervical adenopathy.  Neurological: She is alert and oriented to person, place, and time. She has normal reflexes.  Skin: Skin is warm and dry.  Psychiatric: She has a normal mood and affect. Her behavior is normal. Judgment and thought content normal.      Assessment & Plan:   Harini was seen today for private dot physical.  Diagnoses and all orders for this visit:  Encounter for commercial driver medical examination (CDME)       I have discontinued Mikey Kirschner. Roe's metroNIDAZOLE. I am also having her maintain her amLODipine, aspirin, lisinopril, THERA, Pitavastatin Calcium, vitamin B-12, Omega-3 Fatty Acids (FISH OIL PO), and CALCIUM PO.  Allergies as of 03/26/2017   No Known Allergies     Medication List  Accurate as of 03/26/17  3:02 PM. Always use your most recent med list.          amLODipine 5 MG tablet Commonly known as:  NORVASC Take 10 mg by mouth daily. TAKE 1 TABLET DAILY   aspirin 81 MG chewable tablet Chew 81 mg by mouth daily.   CALCIUM PO Take by mouth daily.   FISH OIL PO Take 1,000 mg by mouth daily.   lisinopril 10 MG tablet Commonly known as:  PRINIVIL,ZESTRIL Take 20 mg by mouth daily.     LIVALO 2 MG Tabs Generic drug:  Pitavastatin Calcium Take 1 tablet by mouth daily.   THERA Tabs Take 1 tablet by mouth daily.   vitamin B-12 100 MCG tablet Commonly known as:  CYANOCOBALAMIN Take 100 mcg by mouth daily.        Follow-up: Return in about 1 year (around 03/26/2018).  Claretta Fraise, M.D.

## 2017-04-12 ENCOUNTER — Encounter: Payer: Self-pay | Admitting: Obstetrics & Gynecology

## 2017-04-12 ENCOUNTER — Ambulatory Visit: Payer: Medicare HMO | Admitting: Obstetrics & Gynecology

## 2017-04-12 VITALS — BP 100/60 | HR 96 | Ht 66.0 in | Wt 186.0 lb

## 2017-04-12 DIAGNOSIS — N813 Complete uterovaginal prolapse: Secondary | ICD-10-CM

## 2017-04-12 DIAGNOSIS — Z4689 Encounter for fitting and adjustment of other specified devices: Secondary | ICD-10-CM

## 2017-04-12 NOTE — Progress Notes (Signed)
   Patient ID: [name], female   DOB: 1950/05/19, 67 y.o.   MRN: [id number]  Catherine Carney is in today for follow-up related to her pessary  She is fit with a Milex ring with support #5 She has complete uterine procidentia which means a Gellhorn would be the perfect pessary but I am uncomfortable using those in an active person She is doing well with a Milex ring with support #5 however without any complaints  She currently denies any bleeding or significant discharge or odor  Blood pressure 100/60, pulse 96, height 5\' 6"  (1.676 m), weight 186 lb (84.4 kg).   On exam today the pessary is well seated in the vagina and there is no mucosal breakdown or abnormalities The pessary is removed and cleaned with warm water and soap Is replaced in the vagina without difficulty and seated well  I will see [name] back in 4 months for follow-up for routine pessary maintenance or prior to that if needed  Return in about 4 months (around 08/12/2017) for Follow up, with Dr Elonda Husky.

## 2017-08-13 ENCOUNTER — Encounter: Payer: Self-pay | Admitting: Obstetrics & Gynecology

## 2017-08-13 ENCOUNTER — Ambulatory Visit: Payer: Medicare HMO | Admitting: Obstetrics & Gynecology

## 2017-08-13 ENCOUNTER — Other Ambulatory Visit: Payer: Self-pay

## 2017-08-13 VITALS — Ht 66.0 in | Wt 184.0 lb

## 2017-08-13 DIAGNOSIS — N813 Complete uterovaginal prolapse: Secondary | ICD-10-CM

## 2017-08-13 DIAGNOSIS — Z4689 Encounter for fitting and adjustment of other specified devices: Secondary | ICD-10-CM | POA: Diagnosis not present

## 2017-08-13 NOTE — Progress Notes (Signed)
Chief Complaint  Patient presents with  . Pessary cleaning    Height 5\' 6"  (1.676 m), weight 184 lb (83.5 kg).  Catherine Carney presents today for routine follow up related to her pessary.   She uses a Milex ring with support #5 She reports no vaginal discharge or vaginal bleeding.  Exam reveals no undue vaginal mucosal pressure of breakdown, no discharge and no vaginal bleeding.  The pessary is removed, cleaned and replaced without difficulty.    Catherine Carney will be sen back in 4 months for continued follow up.  Florian Buff, MD  08/13/2017 11:27 AM

## 2017-12-14 ENCOUNTER — Ambulatory Visit: Payer: Medicare HMO | Admitting: Obstetrics & Gynecology

## 2017-12-14 ENCOUNTER — Encounter: Payer: Self-pay | Admitting: Obstetrics & Gynecology

## 2017-12-14 VITALS — BP 116/68 | HR 69 | Ht 66.0 in | Wt 191.0 lb

## 2017-12-14 DIAGNOSIS — B3731 Acute candidiasis of vulva and vagina: Secondary | ICD-10-CM

## 2017-12-14 DIAGNOSIS — Z4689 Encounter for fitting and adjustment of other specified devices: Secondary | ICD-10-CM | POA: Diagnosis not present

## 2017-12-14 DIAGNOSIS — B373 Candidiasis of vulva and vagina: Secondary | ICD-10-CM

## 2017-12-14 DIAGNOSIS — R739 Hyperglycemia, unspecified: Secondary | ICD-10-CM

## 2017-12-14 MED ORDER — FLUCONAZOLE 100 MG PO TABS: 100.0000 mg | ORAL_TABLET | Freq: Every day | ORAL | 0 refills | Status: DC

## 2017-12-14 NOTE — Progress Notes (Signed)
Chief Complaint  Patient presents with  . Pessary Check      67 y.o. G3P3003 No LMP recorded. Patient is postmenopausal. The current method of family planning is tubal ligation and post menopausal status.  Outpatient Encounter Medications as of 12/14/2017  Medication Sig Note  . amLODipine (NORVASC) 5 MG tablet Take 10 mg by mouth daily. TAKE 1 TABLET DAILY 01/23/2014: Received from: [facility]  . aspirin 81 MG chewable tablet Chew 81 mg by mouth daily.  01/23/2014: Received from: [facility]  . CALCIUM PO Take by mouth daily.   Marland Kitchen lisinopril (PRINIVIL,ZESTRIL) 10 MG tablet Take 20 mg by mouth daily.  01/23/2014: Received from: [facility]  . Multiple Vitamin (THERA) TABS Take 1 tablet by mouth daily.  01/23/2014: Received from: [facility]  . Omega-3 Fatty Acids (FISH OIL PO) Take 1,000 mg by mouth daily.   . rosuvastatin (CRESTOR) 40 MG tablet Take by mouth daily.    . vitamin B-12 (CYANOCOBALAMIN) 100 MCG tablet Take 100 mcg by mouth daily.   . fluconazole (DIFLUCAN) 100 MG tablet Take 1 tablet (100 mg total) by mouth daily.    No facility-administered encounter medications on file as of 12/14/2017.     Subjective Having external itching for 1-2 months Not associated with antibiotics, worse after warm water exposure Was treated with 1 diflcuan without success 1 month ago No odor no blood seen No complaints from pessary  Chief Complaint  Patient presents with  . Pessary Check    Blood pressure 116/68, pulse 69, height 5\' 6"  (1.676 m), weight 191 lb (86.6 kg).  Catherine Carney presents today for routine follow up related to her pessary.   She uses a milex ring with support #5 She reports no vaginal discharge or vaginal bleeding.  Exam reveals no undue vaginal mucosal pressure of breakdown, no discharge and no vaginal bleeding.  The pessary is removed, cleaned and replaced without difficulty.    Catherine Carney will be sen back in 4 months for continued  follow up.      Past Medical History:  Diagnosis Date  . Female bladder prolapse   . Hyperlipidemia   . Hypertension   . Osteoarthritis     Past Surgical History:  Procedure Laterality Date  . GALLBLADDER SURGERY    . TUBAL LIGATION      OB History    Gravida  3   Para  3   Term  3   Preterm      AB      Living  3     SAB      TAB      Ectopic      Multiple      Live Births  3           No Known Allergies  Social History   Socioeconomic History  . Marital status: Married    Spouse name: Not on file  . Number of children: Not on file  . Years of education: Not on file  . Highest education level: Not on file  Occupational History  . Not on file  Social Needs  . Financial resource strain: Not on file  . Food insecurity:    Worry: Not on file    Inability: Not on file  . Transportation needs:    Medical: Not on file    Non-medical: Not on file  Tobacco Use  . Smoking status: Never Smoker  . Smokeless tobacco:  Never Used  Substance and Sexual Activity  . Alcohol use: No    Alcohol/week: 0.0 standard drinks  . Drug use: No  . Sexual activity: Not Currently    Birth control/protection: Surgical    Comment: tubal  Lifestyle  . Physical activity:    Days per week: Not on file    Minutes per session: Not on file  . Stress: Not on file  Relationships  . Social connections:    Talks on phone: Not on file    Gets together: Not on file    Attends religious service: Not on file    Active member of club or organization: Not on file    Attends meetings of clubs or organizations: Not on file    Relationship status: Not on file  Other Topics Concern  . Not on file  Social History Narrative  . Not on file    Family History  Problem Relation Age of Onset  . Heart attack Maternal Grandmother   . Cancer Maternal Grandfather   . Cancer Father        lung  . Other Mother        irregular heart beat  . Hypertension Mother   . Diabetes  Brother   . Hypertension Brother   . High Cholesterol Brother   . Atrial fibrillation Brother   . Hypertension Sister   . High Cholesterol Sister   . Colon cancer Sister   . Rheum arthritis Sister   . Other Sister        irregular heart beat  . Hypertension Son   . Cancer Other        maternal side  . Stroke Other        maternal side    Medications:       Current Outpatient Medications:  .  amLODipine (NORVASC) 5 MG tablet, Take 10 mg by mouth daily. TAKE 1 TABLET DAILY, Disp: , Rfl:  .  aspirin 81 MG chewable tablet, Chew 81 mg by mouth daily. , Disp: , Rfl:  .  CALCIUM PO, Take by mouth daily., Disp: , Rfl:  .  lisinopril (PRINIVIL,ZESTRIL) 10 MG tablet, Take 20 mg by mouth daily. , Disp: , Rfl:  .  Multiple Vitamin (THERA) TABS, Take 1 tablet by mouth daily. , Disp: , Rfl:  .  Omega-3 Fatty Acids (FISH OIL PO), Take 1,000 mg by mouth daily., Disp: , Rfl:  .  rosuvastatin (CRESTOR) 40 MG tablet, Take by mouth daily. , Disp: , Rfl:  .  vitamin B-12 (CYANOCOBALAMIN) 100 MCG tablet, Take 100 mcg by mouth daily., Disp: , Rfl:  .  fluconazole (DIFLUCAN) 100 MG tablet, Take 1 tablet (100 mg total) by mouth daily., Disp: 7 tablet, Rfl: 0  Objective Blood pressure 116/68, pulse 69, height 5\' 6"  (1.676 m), weight 191 lb (86.6 kg).  General WDWN female NAD Vulva:  Erythema pattern consistent with yeast vulvitis Vagina:  normal mucosa, no discharge Cervix:   Uterus:   Adnexa: ovaries:,     Pertinent ROS No burning with urination, frequency or urgency No nausea, vomiting or diarrhea Nor fever chills or other constitutional symptoms   Labs or studies Pattern is concerning for hyperglycemia, will check A1C    Impression Diagnoses this Encounter::   ICD-10-CM   1. Vulvovaginitis due to yeast B37.3 Hemoglobin A1C  2. Pessary maintenance Z46.89   3. Hyperglycemia R73.9 Hemoglobin A1C    Established relevant diagnosis(es):   Plan/Recommendations: Meds ordered this  encounter  Medications  . fluconazole (DIFLUCAN) 100 MG tablet    Sig: Take 1 tablet (100 mg total) by mouth daily.    Dispense:  7 tablet    Refill:  0    Labs or Scans Ordered: Orders Placed This Encounter  Procedures  . Hemoglobin A1C    Management:: >painted with gentian violet and diflucan orally for 1 week >Check Hemoglobin A1C >pessary maintenance performed  Follow up Return in about 4 months (around 04/14/2018) for Follow up, LROB.       All questions were answered.

## 2017-12-15 LAB — HEMOGLOBIN A1C
ESTIMATED AVERAGE GLUCOSE: 114 mg/dL
Hgb A1c MFr Bld: 5.6 % (ref 4.8–5.6)

## 2018-04-14 ENCOUNTER — Ambulatory Visit: Payer: Medicare HMO | Admitting: Obstetrics & Gynecology

## 2018-04-14 ENCOUNTER — Other Ambulatory Visit: Payer: Self-pay

## 2018-04-14 ENCOUNTER — Encounter: Payer: Self-pay | Admitting: Obstetrics & Gynecology

## 2018-04-14 VITALS — BP 116/66 | HR 74 | Ht 66.0 in | Wt 198.0 lb

## 2018-04-14 DIAGNOSIS — Z4689 Encounter for fitting and adjustment of other specified devices: Secondary | ICD-10-CM | POA: Diagnosis not present

## 2018-04-14 DIAGNOSIS — N813 Complete uterovaginal prolapse: Secondary | ICD-10-CM

## 2018-04-14 NOTE — Progress Notes (Signed)
Chief Complaint  Patient presents with  . pessary maintenance    Blood pressure 116/66, pulse 74, height 5\' 6"  (1.676 m), weight 198 lb (89.8 kg).  Catherine Carney presents today for routine follow up related to her pessary.   She uses a milex ring with support #5 She reports no vaginal discharge or vaginal bleeding.  Exam reveals no undue vaginal mucosal pressure of breakdown, no discharge and no vaginal bleeding.  The pessary is removed, cleaned and replaced without difficulty.    Catherine Carney will be sen back in 4 months for continued follow up.  Florian Buff, MD  04/14/2018 10:49 AM

## 2018-08-02 ENCOUNTER — Ambulatory Visit: Payer: Medicare HMO | Admitting: Obstetrics & Gynecology

## 2018-08-08 ENCOUNTER — Telehealth: Payer: Self-pay | Admitting: *Deleted

## 2018-08-08 NOTE — Telephone Encounter (Signed)

## 2018-08-09 ENCOUNTER — Other Ambulatory Visit: Payer: Self-pay

## 2018-08-09 ENCOUNTER — Ambulatory Visit: Payer: Medicare HMO | Admitting: Obstetrics & Gynecology

## 2018-08-09 ENCOUNTER — Encounter: Payer: Self-pay | Admitting: Obstetrics & Gynecology

## 2018-08-09 VITALS — BP 132/76 | HR 81 | Ht 66.0 in | Wt 199.0 lb

## 2018-08-09 DIAGNOSIS — Z4689 Encounter for fitting and adjustment of other specified devices: Secondary | ICD-10-CM | POA: Diagnosis not present

## 2018-08-09 DIAGNOSIS — N813 Complete uterovaginal prolapse: Secondary | ICD-10-CM

## 2018-08-09 NOTE — Progress Notes (Signed)
Chief Complaint  Patient presents with  . Pessary Check    Blood pressure 132/76, pulse 81, height 5\' 6"  (1.676 m), weight 199 lb (90.3 kg).  Catherine Carney presents today for routine follow up related to her pessary.   She uses a Milex ring with support #6 She reports no vaginal discharge or vaginal bleeding.  Exam reveals no undue vaginal mucosal pressure of breakdown, no discharge and no vaginal bleeding.  The pessary is removed, cleaned and replaced without difficulty.    Catherine Carney will be sen back in 4 months for continued follow up.  Florian Buff, MD  08/09/2018 10:51 AM

## 2018-08-15 ENCOUNTER — Ambulatory Visit: Payer: Medicare HMO | Admitting: Obstetrics & Gynecology

## 2018-12-09 ENCOUNTER — Ambulatory Visit: Payer: Medicare HMO | Admitting: Obstetrics & Gynecology

## 2018-12-09 ENCOUNTER — Other Ambulatory Visit: Payer: Self-pay

## 2018-12-09 ENCOUNTER — Encounter: Payer: Self-pay | Admitting: Obstetrics & Gynecology

## 2018-12-09 VITALS — BP 130/79 | HR 76 | Ht 66.0 in | Wt 197.0 lb

## 2018-12-09 DIAGNOSIS — Z4689 Encounter for fitting and adjustment of other specified devices: Secondary | ICD-10-CM | POA: Diagnosis not present

## 2018-12-09 NOTE — Progress Notes (Signed)
Chief Complaint  Patient presents with  . Pessary Check    Blood pressure 130/79, pulse 76, height 5\' 6"  (1.676 m), weight 197 lb (89.4 kg).  Catherine Carney presents today for routine follow up related to her pessary.   She uses a Milex ring with support #6 She reports no vaginal discharge or vaginal bleeding.  Exam reveals no undue vaginal mucosal pressure of breakdown, no discharge and no vaginal bleeding.  The pessary is removed, cleaned and replaced without difficulty.    Catherine Carney will be sen back in 4 months for continued follow up.  Florian Buff, MD  12/09/2018 10:51 AM

## 2019-03-31 NOTE — Progress Notes (Signed)
[facility] Note  04/03/2019     CHIEF COMPLAINT Patient presents for Retina Evaluation   HISTORY OF PRESENT ILLNESS: Catherine Carney is a 69 y.o. female who presents to the clinic today for:   HPI    Retina Evaluation    In both eyes.  This started 1 month ago.  Associated Symptoms Distortion.  Negative for Flashes, Pain, Trauma, Fever, Weight Loss, Scalp Tenderness, Redness, Floaters, Photophobia, Jaw Claudication, Fatigue, Shoulder/Hip pain, Glare and Blind Spot.  Context:  distance vision, mid-range vision and near vision.  Treatments tried include no treatments.  I, the attending physician,  performed the HPI with the patient and updated documentation appropriately.          Comments    Referral of Dr. Radford Pax for retinal eval. Patient states in the past month she has noticed her vision becoming  blurry and distorted mostly in her OD. Denies flashes, floaters and ocular pain. Denies gtt's        Last edited by Bernarda Caffey, MD on 04/03/2019 10:05 AM. (History)    pt saw Dr. Radford Pax in November and states that since then her vision has been getting worse, she states one day she was reading and covered her left eye and discovered she has a blurry spot in her right eye that she doesn't have in her left eye, pt has not had cataract sx in either eye  Referring physician: Particia Nearing, [place],  [place] [address]  HISTORICAL INFORMATION:   Selected notes from the MEDICAL RECORD NUMBER Referred by Dr Radford Pax for retina eval   CURRENT MEDICATIONS: No current outpatient medications on file. (Ophthalmic Drugs)   No current facility-administered medications for this visit. (Ophthalmic Drugs)   Current Outpatient Medications (Other)  Medication Sig  . amLODipine (NORVASC) 10 MG tablet Take 10 mg by mouth daily. TAKE 1 TABLET DAILY  . aspirin 81 MG chewable tablet Chew 81 mg by mouth daily.   Marland Kitchen CALCIUM PO Take by mouth daily.  Marland Kitchen  lisinopril (PRINIVIL,ZESTRIL) 10 MG tablet Take 20 mg by mouth daily.   . Multiple Vitamin (THERA) TABS Take 1 tablet by mouth daily.   . Omega-3 Fatty Acids (FISH OIL PO) Take 1,000 mg by mouth daily.  . rosuvastatin (CRESTOR) 40 MG tablet Take by mouth daily.   . vitamin B-12 (CYANOCOBALAMIN) 100 MCG tablet Take 100 mcg by mouth daily.   No current facility-administered medications for this visit. (Other)      REVIEW OF SYSTEMS: ROS    Positive for: Eyes   Negative for: Constitutional, Gastrointestinal, Neurological, Skin, Genitourinary, Musculoskeletal, HENT, Endocrine, Cardiovascular, Respiratory, Psychiatric, Allergic/Imm, Heme/Lymph   Last edited by Zenovia Jordan, LPN on [id number]  075-GRM AM. (History)       ALLERGIES No Known Allergies  PAST MEDICAL HISTORY Past Medical History:  Diagnosis Date  . Female bladder prolapse   . Hyperlipidemia   . Hypertension   . Osteoarthritis    Past Surgical History:  Procedure Laterality Date  . GALLBLADDER SURGERY    . TUBAL LIGATION      FAMILY HISTORY Family History  Problem Relation Age of Onset  . Heart attack Maternal Grandmother   . Cancer Maternal Grandfather   . Cancer Father        lung  . Other Mother        irregular heart beat  . Hypertension Mother   . Diabetes Brother   .  Hypertension Brother   . High Cholesterol Brother   . Atrial fibrillation Brother   . Hypertension Sister   . High Cholesterol Sister   . Colon cancer Sister   . Rheum arthritis Sister   . Other Sister        irregular heart beat  . Hypertension Son   . Cancer Other        maternal side  . Stroke Other        maternal side    SOCIAL HISTORY Social History   Tobacco Use  . Smoking status: Never Smoker  . Smokeless tobacco: Never Used  Substance Use Topics  . Alcohol use: No    Alcohol/week: 0.0 standard drinks  . Drug use: No         OPHTHALMIC EXAM:  Base Eye Exam    Visual Acuity (Snellen - Linear)      Right  Left   Dist cc 20/50 +2 20/25 +1   Dist ph cc NI NI   Correction: Glasses       Tonometry (Tonopen, 9:40 AM)      Right Left   Pressure 15 12       Pupils      Dark Light Shape React APD   Right 3 2 Round Brisk None   Left 3 2 Round Brisk None       Visual Fields (Counting fingers)      Left Right    Full Full       Extraocular Movement      Right Left    Full, Ortho Full, Ortho       Neuro/Psych    Oriented x3: Yes       Dilation    Both eyes: 1.0% Mydriacyl, 2.5% Phenylephrine @ 9:34 AM        Slit Lamp and Fundus Exam    Slit Lamp Exam      Right Left   Lids/Lashes Dermatochalasis - upper lid, Meibomian gland dysfunction Dermatochalasis - upper lid, Meibomian gland dysfunction   Conjunctiva/Sclera White and quiet White and quiet   Cornea Trace Punctate epithelial erosions, Debris in tear film Trace Punctate epithelial erosions   Anterior Chamber Deep and quiet Deep and quiet   Iris Round and dilated Round and dilated   Lens 1-2+ Nuclear sclerosis, 2+ Cortical cataract 2+ Nuclear sclerosis, 2+ Cortical cataract   Vitreous Vitreous syneresis Vitreous syneresis       Fundus Exam      Right Left   Disc Pink and Sharp, mild temporal PPA/PPP Pink and Sharp   C/D Ratio 0.5 0.2   Macula Blunted foveal reflex, small FTMH, mild cystic changes, no heme Flat, Good foveal reflex, No heme or edema   Vessels Vascular attenuation, mild Tortuousity Vascular attenuation, mild Tortuousity   Periphery Attached, no heme  Attached, no heme          IMAGING AND PROCEDURES  Imaging and Procedures for @TODAY @  OCT, Retina - OU - Both Eyes       Right Eye Quality was good. Central Foveal Thickness: 328. Progression has no prior data. Findings include normal foveal contour, macular hole, intraretinal fluid, no SRF (Partial PVD).   Left Eye Quality was good. Central Foveal Thickness: 281. Progression has no prior data. Findings include normal foveal contour, no IRF, no  SRF (VMT with trace macular cyst).   Notes *Images captured and stored on drive  Diagnosis / Impression:  OD: FTMH; +IRF OS: NFP, no IRF/SRF;  VMT with trace macular cyst  Clinical management:  See below  Abbreviations: NFP - Normal foveal profile. CME - cystoid macular edema. PED - pigment epithelial detachment. IRF - intraretinal fluid. SRF - subretinal fluid. EZ - ellipsoid zone. ERM - epiretinal membrane. ORA - outer retinal atrophy. ORT - outer retinal tubulation. SRHM - subretinal hyper-reflective material                 ASSESSMENT/PLAN:    ICD-10-CM   1. Macular hole of right eye  H35.341   2. Retinal edema  H35.81 OCT, Retina - OU - Both Eyes  3. Essential hypertension  I10   4. Hypertensive retinopathy of both eyes  H35.033   5. Combined forms of age-related cataract of both eyes  H25.813     1,2. Full thickness macular hole OD  - The natural history, anatomy, potential for loss of vision, and treatment options including vitrectomy techniques and the complications of endophthalmitis, retinal detachment, vitreous hemorrhage, cataract progression and permanent vision loss discussed with the patient.  - recommend 25g PPV/MP and gas OD under general anesthesia  - RBA of procedure discussed, questions answered  - informed consent obtained and signed  - will schedule surgery for April 20, 2019 11:30AM, [facility] OR 8  - will need pre op COVID testing and medical clearance from PCP  3,4. Hypertensive retinopathy OU  - discussed importance of tight BP control  - monitor  5. Mixed form age related cataract OU  - The symptoms of cataract, surgical options, and treatments and risks were discussed with patient.  - discussed diagnosis and progression  - specifically discussed likelihood of cataract progression post-vitrectomy  - monitor   Ophthalmic Meds Ordered this visit:  No orders of the defined types were placed in this encounter.      Return in about 18 days  (around 04/21/2019) for POV.  There are no Patient Instructions on file for this visit.   Explained the diagnoses, plan, and follow up with the patient and they expressed understanding.  Patient expressed understanding of the importance of proper follow up care.   This document serves as a record of services personally performed by Gardiner Sleeper, MD, PhD. It was created on their behalf by Leeann Must, [place], a certified ophthalmic assistant. The creation of this record is the provider's dictation and/or activities during the visit.    Electronically signed by: Leeann Must, COA @TODAY @ 10:13 PM   This document serves as a record of services personally performed by Gardiner Sleeper, MD, PhD. It was created on their behalf by Ernest Mallick, OA, an ophthalmic assistant. The creation of this record is the provider's dictation and/or activities during the visit.    Electronically signed by: Ernest Mallick, OA 02.22.2021 10:13 PM   Gardiner Sleeper, M.D., Ph.D. Diseases & Surgery of the Retina and Vitreous [facility]  I have reviewed the above documentation for accuracy and completeness, and I agree with the above. Gardiner Sleeper, M.D., Ph.D. 04/03/19 10:13 PM    Abbreviations: M myopia (nearsighted); A astigmatism; H hyperopia (farsighted); P presbyopia; Mrx spectacle prescription;  CTL contact lenses; OD right eye; OS left eye; OU both eyes  XT exotropia; ET esotropia; PEK punctate epithelial keratitis; PEE punctate epithelial erosions; DES dry eye syndrome; MGD meibomian gland dysfunction; ATs artificial tears; PFAT's preservative free artificial tears; [place] nuclear sclerotic cataract; PSC posterior subcapsular cataract; ERM epi-retinal membrane; PVD posterior vitreous detachment; RD retinal detachment;  DM diabetes mellitus; DR diabetic retinopathy; NPDR non-proliferative diabetic retinopathy; PDR proliferative diabetic retinopathy; CSME clinically significant macular  edema; DME diabetic macular edema; dbh dot blot hemorrhages; CWS cotton wool spot; POAG primary open angle glaucoma; C/D cup-to-disc ratio; HVF humphrey visual field; GVF goldmann visual field; OCT optical coherence tomography; IOP intraocular pressure; BRVO Branch retinal vein occlusion; CRVO central retinal vein occlusion; CRAO central retinal artery occlusion; BRAO branch retinal artery occlusion; RT retinal tear; SB scleral buckle; PPV pars plana vitrectomy; VH Vitreous hemorrhage; PRP panretinal laser photocoagulation; IVK intravitreal kenalog; VMT vitreomacular traction; MH Macular hole;  NVD neovascularization of the disc; NVE neovascularization elsewhere; AREDS age related eye disease study; ARMD age related macular degeneration; POAG primary open angle glaucoma; EBMD epithelial/anterior basement membrane dystrophy; ACIOL anterior chamber intraocular lens; IOL intraocular lens; PCIOL posterior chamber intraocular lens; Phaco/IOL phacoemulsification with intraocular lens placement; [place] photorefractive keratectomy; LASIK laser assisted in situ keratomileusis; HTN hypertension; DM diabetes mellitus; COPD chronic obstructive pulmonary disease

## 2019-04-03 ENCOUNTER — Ambulatory Visit (INDEPENDENT_AMBULATORY_CARE_PROVIDER_SITE_OTHER): Payer: Medicare HMO | Admitting: Ophthalmology

## 2019-04-03 ENCOUNTER — Other Ambulatory Visit: Payer: Self-pay

## 2019-04-03 ENCOUNTER — Encounter (INDEPENDENT_AMBULATORY_CARE_PROVIDER_SITE_OTHER): Payer: Self-pay | Admitting: Ophthalmology

## 2019-04-03 DIAGNOSIS — H25813 Combined forms of age-related cataract, bilateral: Secondary | ICD-10-CM

## 2019-04-03 DIAGNOSIS — H35033 Hypertensive retinopathy, bilateral: Secondary | ICD-10-CM

## 2019-04-03 DIAGNOSIS — I10 Essential (primary) hypertension: Secondary | ICD-10-CM

## 2019-04-03 DIAGNOSIS — H35341 Macular cyst, hole, or pseudohole, right eye: Secondary | ICD-10-CM

## 2019-04-03 DIAGNOSIS — H3581 Retinal edema: Secondary | ICD-10-CM

## 2019-04-06 ENCOUNTER — Encounter: Payer: Self-pay | Admitting: Obstetrics and Gynecology

## 2019-04-06 ENCOUNTER — Other Ambulatory Visit: Payer: Self-pay

## 2019-04-06 ENCOUNTER — Ambulatory Visit: Payer: Medicare HMO | Admitting: Obstetrics and Gynecology

## 2019-04-06 VITALS — BP 141/78 | HR 82 | Ht 66.0 in | Wt 197.8 lb

## 2019-04-06 DIAGNOSIS — Z4689 Encounter for fitting and adjustment of other specified devices: Secondary | ICD-10-CM

## 2019-04-06 DIAGNOSIS — B373 Candidiasis of vulva and vagina: Secondary | ICD-10-CM | POA: Diagnosis not present

## 2019-04-06 MED ORDER — FLUCONAZOLE 150 MG PO TABS: 150.0000 mg | ORAL_TABLET | ORAL | 1 refills | Status: DC

## 2019-04-06 MED ORDER — NYSTATIN-TRIAMCINOLONE 100000-0.1 UNIT/GM-% EX OINT: 1.0000 "application " | TOPICAL_OINTMENT | Freq: Two times a day (BID) | CUTANEOUS | 99 refills | Status: DC

## 2019-04-06 NOTE — Progress Notes (Signed)
Patient ID: Catherine Carney, female   DOB: [birth year], 69 y.o.   MRN: [id number]  GYNECOLOGY CLINIC PROGRESS NOTE  The patient presented today for a pessary check. She reports no vaginal bleeding or discharge. She denies pelvic discomfort and difficulty urinating or moving her bowels. ?The patient's #5 ring pessary with diaphragm  was removed, cleaned and replaced without complications. Speculum examination revealed slight vaginal irritation at introitus and yeast infection.The patient should return in 3 months for a pessary check and continue to use vaginal estrogen cream weekly as prescribed.  A: 1. Pessary check  2. Yeast infection  P:  1. Monistat cream 2. Premarin vaginal cream  By signing my name below, I, Samul Dada, attest that this documentation has been prepared under the direction and in the presence of Jonnie Kind, MD. Electronically Signed: [place]. 04/06/19. 9:45 AM.  I personally performed the services described in this documentation, which was SCRIBED in my presence. The recorded information has been reviewed and considered accurate. It has been edited as necessary during review. Jonnie Kind, MD

## 2019-04-07 ENCOUNTER — Ambulatory Visit: Payer: Medicare HMO | Admitting: Obstetrics and Gynecology

## 2019-04-17 ENCOUNTER — Other Ambulatory Visit (HOSPITAL_COMMUNITY)
Admission: RE | Admit: 2019-04-17 | Discharge: 2019-04-17 | Disposition: A | Discharge status: Home / Self Care | Payer: Medicare HMO | Source: Ambulatory Visit | Attending: Ophthalmology | Admitting: Ophthalmology

## 2019-04-17 ENCOUNTER — Other Ambulatory Visit: Payer: Self-pay

## 2019-04-17 DIAGNOSIS — Z01812 Encounter for preprocedural laboratory examination: Secondary | ICD-10-CM | POA: Diagnosis present

## 2019-04-17 DIAGNOSIS — Z20822 Contact with and (suspected) exposure to covid-19: Secondary | ICD-10-CM | POA: Insufficient documentation

## 2019-04-17 NOTE — H&P (Signed)
Catherine Carney is an 69 y.o. female.    Chief Complaint: macular hole, right eye  HPI: Pt presented with a 1 month history of decreased / distorted vision OD. On dilated exam, was found to have a macular hole OD. After discussion of the diagnosis, prognosis, and treatment options with risks, benefits and alternatives, the patient elected to proceed with surgery for repair of macular hole OD -- 25g PPV w/ MP and gas OD under general anesthesia.  Past Medical History:  Diagnosis Date  . Female bladder prolapse   . Hyperlipidemia   . Hypertension   . Macular hole of right eye   . Osteoarthritis     Past Surgical History:  Procedure Laterality Date  . GALLBLADDER SURGERY    . TUBAL LIGATION      Family History  Problem Relation Age of Onset  . Heart attack Maternal Grandmother   . Cancer Maternal Grandfather   . Cancer Father        lung  . Other Mother        irregular heart beat  . Hypertension Mother   . Diabetes Brother   . Hypertension Brother   . High Cholesterol Brother   . Atrial fibrillation Brother   . Melanoma Brother        right ear  . Hypertension Sister   . High Cholesterol Sister   . Colon cancer Sister   . Rheum arthritis Sister   . Other Sister        irregular heart beat  . Hypertension Son   . Cancer Other        maternal side  . Stroke Other        maternal side   Social History:  reports that she has never smoked. She has never used smokeless tobacco. She reports that she does not drink alcohol or use drugs.  Allergies: No Known Allergies  No medications prior to admission.    Review of systems otherwise negative  There were no vitals taken for this visit.  Physical exam: Mental status: oriented x3. Eyes: See eye exam associated with this date of surgery Ears, Nose, Throat: within normal limits Neck: Within Normal limits General: within normal limits Chest: Within normal limits Breast: deferred Heart: Within normal limits Abdomen:  Within normal limits GU: deferred Extremities: within normal limits Skin: within normal limits  Assessment/Plan 1. Macular hole OD  Plan: To [name] for 25g PPV w/ MP and gas, OD, under general anesthesia - case scheduled for 3.11.2021, 1130 am, [facility] OR 08  Gardiner Sleeper, M.D., Ph.D. Vitreoretinal Surgeon [facility]

## 2019-04-18 ENCOUNTER — Encounter (HOSPITAL_COMMUNITY): Payer: Self-pay | Admitting: Ophthalmology

## 2019-04-18 ENCOUNTER — Other Ambulatory Visit: Payer: Self-pay

## 2019-04-18 LAB — SARS CORONAVIRUS 2 (TAT 6-24 HRS): SARS Coronavirus 2: NEGATIVE

## 2019-04-18 NOTE — Progress Notes (Signed)
PRE-OP PHONE CALL COMPLETE   PCP - Dr. Skeet Simmer Cardiologist - denies  PPM/ICD - N/A Device Orders -N/A  Rep Notified - N/A  Chest x-ray - N/A EKG - will need DOS Stress Test - denies ECHO - denies Cardiac Cath - denies  Sleep Study - denies CPAP - denies  Blood Thinner Instructions: N/A Aspirin Instructions:No instructions given by surgeon to pt. Will hold ASA.  ERAS Protcol -N/A PRE-SURGERY Ensure or G2-N/A   COVID TEST- Tested negative on 04/17/19.   Anesthesia review: No  Patient denies shortness of breath, fever, cough and chest pain at PAT appointment   All instructions explained to the patient, with a verbal understanding of the material. Patient agrees to go over the instructions while at home for a better understanding. Patient also instructed to self quarantine after being tested for COVID-19. The opportunity to ask questions was provided.

## 2019-04-18 NOTE — Progress Notes (Signed)
[facility] Note  04/21/2019     CHIEF COMPLAINT Patient presents for Post-op Follow-up   HISTORY OF PRESENT ILLNESS: Catherine Carney is a 69 y.o. female who presents to the clinic today for:   HPI    Post-op Follow-up    In right eye.  Discomfort includes itching.  Vision is blurred at distance and is blurred at near.  I, the attending physician,  performed the HPI with the patient and updated documentation appropriately.          Comments    69 y/o female pt here for 1 day po s/p FTMH repair sx via PPV/MP OD 3.11.21.  Pt didn't sleep well last night.  Had some minor pain immediately after sx, which soon wore off.  OD has itched a bit since sx.  No change in [place] OS.  Denies flashes, floaters.  Has not yet started po gtts.       Last edited by Bernarda Caffey, MD on 04/21/2019 12:37 PM. (History)    pt is with her sister today   Referring physician: Chesley Noon, MD [place],  [place] [address]  HISTORICAL INFORMATION:   Selected notes from the MEDICAL RECORD NUMBER Referred by Dr Radford Pax for retina eval   CURRENT MEDICATIONS: No current outpatient medications on file. (Ophthalmic Drugs)   No current facility-administered medications for this visit. (Ophthalmic Drugs)   Current Outpatient Medications (Other)  Medication Sig  . acetaminophen (TYLENOL) 500 MG tablet Take 500 mg by mouth every 6 (six) hours as needed (for pain.).  Marland Kitchen amLODipine (NORVASC) 10 MG tablet Take 10 mg by mouth daily.   Marland Kitchen aspirin EC 81 MG tablet Take 81 mg by mouth daily.  . Calcium Carb-Cholecalciferol (CALCIUM + D3 PO) Take 1 tablet by mouth daily.  . Coenzyme Q10 (COQ10) 100 MG CAPS Take 100 mg by mouth daily.  . fluconazole (DIFLUCAN) 150 MG tablet Take 1 tablet (150 mg total) by mouth every 3 (three) days.  Marland Kitchen lisinopril (ZESTRIL) 20 MG tablet Take 20 mg by mouth daily.  . Multiple Vitamin (MULTIVITAMIN WITH MINERALS) TABS tablet Take 1 tablet by  mouth daily.  Marland Kitchen nystatin-triamcinolone ointment (MYCOLOG) Apply 1 application topically 2 (two) times daily. To affected area. (Patient not taking: Reported on 04/14/2019)  . Omega 3-6-9 Fatty Acids (TRIPLE OMEGA-3-6-9 PO) Take 1 capsule by mouth daily.  . rosuvastatin (CRESTOR) 40 MG tablet Take 20 mg by mouth at bedtime.   . vitamin B-12 (CYANOCOBALAMIN) 500 MCG tablet Take 500 mcg by mouth daily.   No current facility-administered medications for this visit. (Other)      REVIEW OF SYSTEMS: ROS    Positive for: Musculoskeletal, Eyes   Negative for: Constitutional, Gastrointestinal, Neurological, Skin, Genitourinary, HENT, Endocrine, Cardiovascular, Respiratory, Psychiatric, Allergic/Imm, Heme/Lymph   Last edited by Matthew Folks, COA on 04/21/2019  8:56 AM. (History)       ALLERGIES No Known Allergies  PAST MEDICAL HISTORY Past Medical History:  Diagnosis Date  . Cataract    Combined form OU  . Family history of adverse reaction to anesthesia    sister had PONV  . Female bladder prolapse   . Hyperlipidemia   . Hypertension   . Hypertensive retinopathy    OU  . Macular hole of right eye   . Osteoarthritis    Past Surgical History:  Procedure Laterality Date  . [place] VITRECTOMY WITH 20 GAUGE MVR PORT  FOR MACULAR HOLE Right 04/20/2019   Procedure: 54 GAUGE PARS PLANA VITRECTOMY WITH 20 GAUGE MVR PORT FOR MACULAR HOLE WITH MEMBRANE PEEL;  Surgeon: Bernarda Caffey, MD;  Location: [facility];  Service: Ophthalmology;  Laterality: Right;  . CHOLECYSTECTOMY    . EYE SURGERY Right 04/20/2019   [facility] repair - PPV/MP - Dr. Bernarda Caffey  . GALLBLADDER SURGERY    . GAS/FLUID EXCHANGE Right 04/20/2019   Procedure: Gas/Fluid Exchange;  Surgeon: Bernarda Caffey, MD;  Location: [facility];  Service: Ophthalmology;  Laterality: Right;  . PHOTOCOAGULATION WITH LASER Right 04/20/2019   Procedure: Photocoagulation With Laser;  Surgeon: Bernarda Caffey, MD;  Location: [facility];  Service:  Ophthalmology;  Laterality: Right;  . TUBAL LIGATION      FAMILY HISTORY Family History  Problem Relation Age of Onset  . Heart attack Maternal Grandmother   . Cancer Maternal Grandfather   . Cancer Father        lung  . Other Mother        irregular heart beat  . Hypertension Mother   . Diabetes Brother   . Hypertension Brother   . High Cholesterol Brother   . Atrial fibrillation Brother   . Melanoma Brother        right ear  . Hypertension Sister   . High Cholesterol Sister   . Colon cancer Sister   . Rheum arthritis Sister   . Other Sister        irregular heart beat  . Hypertension Son   . Cancer Other        maternal side  . Stroke Other        maternal side    SOCIAL HISTORY Social History   Tobacco Use  . Smoking status: Never Smoker  . Smokeless tobacco: Never Used  Substance Use Topics  . Alcohol use: No    Alcohol/week: 0.0 standard drinks  . Drug use: No         OPHTHALMIC EXAM:  Base Eye Exam    Visual Acuity (Snellen - Linear)      Right Left   Dist [place] HM    Dist ph [place] NI        Tonometry (Tonopen, 9:02 AM)      Right Left   Pressure 18        Pupils      Dark Light Shape React APD   Right 4 4 Round Minimal None   Left 3 2 Round Slow None       Visual Fields (Counting fingers)      Left Right    Full    Restrictions  Total superior temporal, inferior temporal, superior nasal, inferior nasal deficiencies       Extraocular Movement      Right Left    Full, Ortho Full, Ortho       Neuro/Psych    Oriented x3: Yes   Mood/Affect: Normal       Dilation    Both eyes: 1.0% Mydriacyl, 2.5% Phenylephrine @ 9:04 AM        Slit Lamp and Fundus Exam    External Exam      Right Left   External Periorbital edema        Slit Lamp Exam      Right Left   Lids/Lashes Dermatochalasis - upper lid, Meibomian gland dysfunction Dermatochalasis - upper lid, Meibomian gland dysfunction   Conjunctiva/Sclera Mild Chemosis inferiorly  White and quiet   Cornea 1+ Descemet's  folds, mild heme on endothelium at 0530 Trace Punctate epithelial erosions   Anterior Chamber Deep, 2+cell/pigment Deep and quiet   Iris Round and dilated Round and dilated   Lens 1-2+ Nuclear sclerosis, 2+ Cortical cataract, 2+PC feathering 2+ Nuclear sclerosis, 2+ Cortical cataract   Vitreous post vitrectomy, good gas fill Vitreous syneresis       Fundus Exam      Right Left   Disc perfused Pink and Sharp   C/D Ratio 0.5 0.2   Macula Attached under gas Flat, Good foveal reflex, No heme or edema   Vessels Vascular attenuation, mild Tortuousity Vascular attenuation, mild Tortuousity   Periphery hazy view, attached, good 360 laser changes Attached, no heme          IMAGING AND PROCEDURES  Imaging and Procedures for _0 @           ASSESSMENT/PLAN:    ICD-10-CM   1. Macular hole of right eye  H35.341   2. Retinal edema  H35.81   3. Essential hypertension  I10   4. Hypertensive retinopathy of both eyes  H35.033   5. Combined forms of age-related cataract of both eyes  H25.813     1,2. Full thickness macular hole OD  - now POD1 s/p PPV/TissueBlue stain/MP/14% C3F8 OD, 03.12.21             - doing well this morning             - retina attached and good gas bubble in place w/ mac hole closing             - IOP 18             - start PF 6x/day OD                          zymaxid QID OD                          Atropine BID OD                         PSO ung QID OD              - cont face down positioning x3 days then can decrease positioning to 50% of time; avoid laying flat on back              - eye shield when sleeping              - post op drop and positioning instructions reviewed              - tylenol/ibuprofen for pain  - f/u 1 week  3,4. Hypertensive retinopathy OU  - discussed importance of tight BP control  - monitor  5. Mixed form age related cataract OU  - The symptoms of cataract, surgical options, and  treatments and risks were discussed with patient.  - discussed diagnosis and progression  - specifically discussed likelihood of cataract progression post-vitrectomy  - monitor   Ophthalmic Meds Ordered this visit:  No orders of the defined types were placed in this encounter.      Return in about 1 week (around 04/28/2019) for f/u [place] OD.  There are no Patient Instructions on file for this visit.   Explained the diagnoses, plan, and follow up with the patient and they expressed understanding.  Patient expressed understanding of  the importance of proper follow up care.   This document serves as a record of services personally performed by Gardiner Sleeper, MD, PhD. It was created on their behalf by Ernest Mallick, OA, an ophthalmic assistant. The creation of this record is the provider's dictation and/or activities during the visit.    Electronically signed by: Ernest Mallick, OA 03.09.2021 12:40 PM   Gardiner Sleeper, M.D., Ph.D. Diseases & Surgery of the Retina and Vitreous [facility]  I have reviewed the above documentation for accuracy and completeness, and I agree with the above. Gardiner Sleeper, M.D., Ph.D. 04/21/19 12:40 PM   Abbreviations: M myopia (nearsighted); A astigmatism; H hyperopia (farsighted); P presbyopia; Mrx spectacle prescription;  CTL contact lenses; OD right eye; OS left eye; OU both eyes  XT exotropia; ET esotropia; PEK punctate epithelial keratitis; PEE punctate epithelial erosions; DES dry eye syndrome; MGD meibomian gland dysfunction; ATs artificial tears; PFAT's preservative free artificial tears; Needles nuclear sclerotic cataract; PSC posterior subcapsular cataract; ERM epi-retinal membrane; PVD posterior vitreous detachment; RD retinal detachment; DM diabetes mellitus; DR diabetic retinopathy; NPDR non-proliferative diabetic retinopathy; PDR proliferative diabetic retinopathy; CSME clinically significant macular edema; DME diabetic macular  edema; dbh dot blot hemorrhages; CWS cotton wool spot; POAG primary open angle glaucoma; C/D cup-to-disc ratio; HVF humphrey visual field; GVF goldmann visual field; OCT optical coherence tomography; IOP intraocular pressure; BRVO Branch retinal vein occlusion; CRVO central retinal vein occlusion; CRAO central retinal artery occlusion; BRAO branch retinal artery occlusion; RT retinal tear; SB scleral buckle; PPV pars plana vitrectomy; VH Vitreous hemorrhage; PRP panretinal laser photocoagulation; IVK intravitreal kenalog; VMT vitreomacular traction; MH Macular hole;  NVD neovascularization of the disc; NVE neovascularization elsewhere; AREDS age related eye disease study; ARMD age related macular degeneration; POAG primary open angle glaucoma; EBMD epithelial/anterior basement membrane dystrophy; ACIOL anterior chamber intraocular lens; IOL intraocular lens; PCIOL posterior chamber intraocular lens; Phaco/IOL phacoemulsification with intraocular lens placement; [place] photorefractive keratectomy; LASIK laser assisted in situ keratomileusis; HTN hypertension; DM diabetes mellitus; COPD chronic obstructive pulmonary disease

## 2019-04-20 ENCOUNTER — Other Ambulatory Visit: Payer: Self-pay

## 2019-04-20 ENCOUNTER — Encounter (HOSPITAL_COMMUNITY)
Admission: RE | Disposition: A | Discharge status: Home / Self Care | Payer: Self-pay | Source: Home / Self Care | Attending: Ophthalmology

## 2019-04-20 ENCOUNTER — Ambulatory Visit (HOSPITAL_COMMUNITY): Payer: Medicare HMO | Admitting: Certified Registered Nurse Anesthetist

## 2019-04-20 ENCOUNTER — Encounter (HOSPITAL_COMMUNITY): Payer: Self-pay | Admitting: Ophthalmology

## 2019-04-20 ENCOUNTER — Ambulatory Visit (HOSPITAL_COMMUNITY)
Admission: RE | Admit: 2019-04-20 | Discharge: 2019-04-20 | Disposition: A | Discharge status: Home / Self Care | Payer: Medicare HMO | Attending: Ophthalmology | Admitting: Ophthalmology

## 2019-04-20 DIAGNOSIS — I10 Essential (primary) hypertension: Secondary | ICD-10-CM | POA: Insufficient documentation

## 2019-04-20 DIAGNOSIS — H35341 Macular cyst, hole, or pseudohole, right eye: Secondary | ICD-10-CM | POA: Insufficient documentation

## 2019-04-20 DIAGNOSIS — H33311 Horseshoe tear of retina without detachment, right eye: Secondary | ICD-10-CM | POA: Insufficient documentation

## 2019-04-20 HISTORY — PX: 25 GAUGE PARS PLANA VITRECTOMY WITH 20 GAUGE MVR PORT FOR MACULAR HOLE: SHX6096

## 2019-04-20 HISTORY — PX: PHOTOCOAGULATION WITH LASER: SHX6027

## 2019-04-20 HISTORY — PX: EYE SURGERY: SHX253

## 2019-04-20 HISTORY — DX: Family history of other specified conditions: Z84.89

## 2019-04-20 HISTORY — PX: GAS/FLUID EXCHANGE: SHX5334

## 2019-04-20 LAB — BASIC METABOLIC PANEL
Anion gap: 12 (ref 5–15)
BUN: 13 mg/dL (ref 8–23)
CO2: 21 mmol/L — ABNORMAL LOW (ref 22–32)
Calcium: 9.2 mg/dL (ref 8.9–10.3)
Chloride: 108 mmol/L (ref 98–111)
Creatinine, Ser: 0.78 mg/dL (ref 0.44–1.00)
GFR calc Af Amer: >60 mL/min (ref >60)
GFR calc non Af Amer: >60 mL/min (ref >60)
Glucose, Bld: 102 mg/dL — ABNORMAL HIGH (ref 70–99)
Potassium: 4 mmol/L (ref 3.5–5.1)
Sodium: 141 mmol/L (ref 135–145)

## 2019-04-20 LAB — CBC
HCT: 43.7 % (ref 36.0–46.0)
Hemoglobin: 13.8 g/dL (ref 12.0–15.0)
MCH: 29.8 pg (ref 26.0–34.0)
MCHC: 31.6 g/dL (ref 30.0–36.0)
MCV: 94.4 fL (ref 80.0–100.0)
Platelets: 338 10*3/uL (ref 150–400)
RBC: 4.63 MIL/uL (ref 3.87–5.11)
RDW: 12.7 % (ref 11.5–15.5)
WBC: 6 10*3/uL (ref 4.0–10.5)
nRBC: 0 % (ref 0.0–0.2)

## 2019-04-20 SURGERY — 25 GAUGE PARS PLANA VITRECTOMY WITH 20 GAUGE MVR PORT FOR MACULAR HOLE
Anesthesia: General | Site: Eye | Laterality: Right

## 2019-04-20 MED ORDER — OXYCODONE HCL 5 MG PO TABS
ORAL_TABLET | ORAL | Status: AC
  Filled 2019-04-20: qty 1

## 2019-04-20 MED ORDER — SODIUM CHLORIDE 0.9 % IV SOLN: INTRAVENOUS | Status: DC

## 2019-04-20 MED ORDER — EPHEDRINE 5 MG/ML INJ
INTRAVENOUS | Status: AC
  Filled 2019-04-20: qty 10

## 2019-04-20 MED ORDER — SUGAMMADEX SODIUM 200 MG/2ML IV SOLN
INTRAVENOUS | Status: DC | PRN
  Administered 2019-04-20: 200 mg via INTRAVENOUS

## 2019-04-20 MED ORDER — PHENYLEPHRINE 40 MCG/ML (10ML) SYRINGE FOR IV PUSH (FOR BLOOD PRESSURE SUPPORT)
PREFILLED_SYRINGE | INTRAVENOUS | Status: AC
  Filled 2019-04-20: qty 10

## 2019-04-20 MED ORDER — TRIAMCINOLONE ACETONIDE 40 MG/ML IJ SUSP
INTRAMUSCULAR | Status: DC | PRN
  Administered 2019-04-20: 40 mg

## 2019-04-20 MED ORDER — DEXAMETHASONE SODIUM PHOSPHATE 10 MG/ML IJ SOLN
INTRAMUSCULAR | Status: AC
  Filled 2019-04-20: qty 1

## 2019-04-20 MED ORDER — PROPOFOL 10 MG/ML IV BOLUS
INTRAVENOUS | Status: DC | PRN
  Administered 2019-04-20: 150 mg via INTRAVENOUS

## 2019-04-20 MED ORDER — CEFTAZIDIME 1 G IJ SOLR
INTRAMUSCULAR | Status: AC
  Filled 2019-04-20: qty 1

## 2019-04-20 MED ORDER — TRIAMCINOLONE ACETONIDE 40 MG/ML IJ SUSP
INTRAMUSCULAR | Status: AC
  Filled 2019-04-20: qty 5

## 2019-04-20 MED ORDER — NA CHONDROIT SULF-NA HYALURON 40-30 MG/ML IO SOLN
INTRAOCULAR | Status: AC
  Filled 2019-04-20: qty 0.5

## 2019-04-20 MED ORDER — LIDOCAINE 2% (20 MG/ML) 5 ML SYRINGE
INTRAMUSCULAR | Status: AC
  Filled 2019-04-20: qty 5

## 2019-04-20 MED ORDER — BACITRACIN-POLYMYXIN B 500-10000 UNIT/GM OP OINT
TOPICAL_OINTMENT | OPHTHALMIC | Status: DC | PRN
  Administered 2019-04-20: 1 via OPHTHALMIC

## 2019-04-20 MED ORDER — STERILE WATER FOR INJECTION IJ SOLN
INTRAMUSCULAR | Status: DC | PRN
  Administered 2019-04-20: 20 mL

## 2019-04-20 MED ORDER — FENTANYL CITRATE (PF) 100 MCG/2ML IJ SOLN
INTRAMUSCULAR | Status: DC | PRN
  Administered 2019-04-20: 100 ug via INTRAVENOUS

## 2019-04-20 MED ORDER — POLYMYXIN B SULFATE 500000 UNITS IJ SOLR
INTRAMUSCULAR | Status: AC
  Filled 2019-04-20: qty 500000

## 2019-04-20 MED ORDER — DORZOLAMIDE HCL-TIMOLOL MAL 2-0.5 % OP SOLN
OPHTHALMIC | Status: AC
  Filled 2019-04-20: qty 10

## 2019-04-20 MED ORDER — OXYCODONE HCL 5 MG/5ML PO SOLN: 5.0000 mg | Freq: Once | ORAL | Status: AC | PRN

## 2019-04-20 MED ORDER — PREDNISOLONE ACETATE 1 % OP SUSP
OPHTHALMIC | Status: DC | PRN
  Administered 2019-04-20: 1 [drp] via OPHTHALMIC

## 2019-04-20 MED ORDER — EPINEPHRINE PF 1 MG/ML IJ SOLN
INTRAOCULAR | Status: DC | PRN
  Administered 2019-04-20: 500 mL

## 2019-04-20 MED ORDER — BRIMONIDINE TARTRATE 0.2 % OP SOLN
OPHTHALMIC | Status: DC | PRN
  Administered 2019-04-20: 1 [drp] via OPHTHALMIC

## 2019-04-20 MED ORDER — PHENYLEPHRINE HCL (PRESSORS) 10 MG/ML IV SOLN
INTRAVENOUS | Status: AC
  Filled 2019-04-20: qty 1

## 2019-04-20 MED ORDER — ATROPINE SULFATE 1 % OP SOLN
1.0000 [drp] | OPHTHALMIC | Status: AC | PRN
  Administered 2019-04-20 (×2): 1 [drp] via OPHTHALMIC

## 2019-04-20 MED ORDER — ROCURONIUM BROMIDE 10 MG/ML (PF) SYRINGE
PREFILLED_SYRINGE | INTRAVENOUS | Status: AC
  Filled 2019-04-20: qty 40

## 2019-04-20 MED ORDER — ATROPINE SULFATE 1 % OP SOLN
OPHTHALMIC | Status: AC
  Administered 2019-04-20: 1 [drp] via OPHTHALMIC
  Filled 2019-04-20: qty 5

## 2019-04-20 MED ORDER — TROPICAMIDE 1 % OP SOLN
1.0000 [drp] | OPHTHALMIC | Status: AC | PRN
  Administered 2019-04-20 (×2): 1 [drp] via OPHTHALMIC

## 2019-04-20 MED ORDER — ONDANSETRON HCL 4 MG/2ML IJ SOLN
INTRAMUSCULAR | Status: DC | PRN
  Administered 2019-04-20: 4 mg via INTRAVENOUS

## 2019-04-20 MED ORDER — GATIFLOXACIN 0.5 % OP SOLN OPTIME - NO CHARGE
OPHTHALMIC | Status: DC | PRN
  Administered 2019-04-20: 1 [drp] via OPHTHALMIC

## 2019-04-20 MED ORDER — EPINEPHRINE PF 1 MG/ML IJ SOLN
INTRAMUSCULAR | Status: AC
  Filled 2019-04-20: qty 1

## 2019-04-20 MED ORDER — ONDANSETRON HCL 4 MG/2ML IJ SOLN
INTRAMUSCULAR | Status: AC
  Filled 2019-04-20: qty 2

## 2019-04-20 MED ORDER — GATIFLOXACIN 0.5 % OP SOLN
OPHTHALMIC | Status: AC
  Filled 2019-04-20: qty 2.5

## 2019-04-20 MED ORDER — FENTANYL CITRATE (PF) 250 MCG/5ML IJ SOLN
INTRAMUSCULAR | Status: AC
  Filled 2019-04-20: qty 5

## 2019-04-20 MED ORDER — TROPICAMIDE 1 % OP SOLN
OPHTHALMIC | Status: AC
  Administered 2019-04-20: 1 [drp] via OPHTHALMIC
  Filled 2019-04-20: qty 15

## 2019-04-20 MED ORDER — PROPOFOL 10 MG/ML IV BOLUS
INTRAVENOUS | Status: AC
  Filled 2019-04-20: qty 20

## 2019-04-20 MED ORDER — PHENYLEPHRINE HCL 10 % OP SOLN
OPHTHALMIC | Status: AC
  Administered 2019-04-20: 1 [drp] via OPHTHALMIC
  Filled 2019-04-20: qty 5

## 2019-04-20 MED ORDER — BSS PLUS IO SOLN
INTRAOCULAR | Status: AC
  Filled 2019-04-20: qty 500

## 2019-04-20 MED ORDER — CARBACHOL 0.01 % IO SOLN
INTRAOCULAR | Status: AC
  Filled 2019-04-20: qty 1.5

## 2019-04-20 MED ORDER — PHENYLEPHRINE HCL (PRESSORS) 10 MG/ML IV SOLN
INTRAVENOUS | Status: DC | PRN
  Administered 2019-04-20: 80 ug via INTRAVENOUS
  Administered 2019-04-20: 120 ug via INTRAVENOUS
  Administered 2019-04-20: 80 ug via INTRAVENOUS
  Administered 2019-04-20 (×2): 120 ug via INTRAVENOUS
  Administered 2019-04-20: 40 ug via INTRAVENOUS
  Administered 2019-04-20: 80 ug via INTRAVENOUS

## 2019-04-20 MED ORDER — ROCURONIUM BROMIDE 100 MG/10ML IV SOLN
INTRAVENOUS | Status: DC | PRN
  Administered 2019-04-20: 80 mg via INTRAVENOUS

## 2019-04-20 MED ORDER — ONDANSETRON HCL 4 MG/2ML IJ SOLN: 4.0000 mg | Freq: Once | INTRAMUSCULAR | Status: DC | PRN

## 2019-04-20 MED ORDER — PROPARACAINE HCL 0.5 % OP SOLN
1.0000 [drp] | OPHTHALMIC | Status: AC | PRN
  Administered 2019-04-20 (×2): 1 [drp] via OPHTHALMIC

## 2019-04-20 MED ORDER — ATROPINE SULFATE 1 % OP SOLN
OPHTHALMIC | Status: DC | PRN
  Administered 2019-04-20: 1 [drp] via OPHTHALMIC

## 2019-04-20 MED ORDER — DEXAMETHASONE SODIUM PHOSPHATE 10 MG/ML IJ SOLN
INTRAMUSCULAR | Status: AC
  Filled 2019-04-20: qty 2

## 2019-04-20 MED ORDER — SODIUM CHLORIDE (PF) 0.9 % IJ SOLN
INTRAMUSCULAR | Status: AC
  Filled 2019-04-20: qty 10

## 2019-04-20 MED ORDER — PREDNISOLONE ACETATE 1 % OP SUSP
OPHTHALMIC | Status: AC
  Filled 2019-04-20: qty 5

## 2019-04-20 MED ORDER — NA CHONDROIT SULF-NA HYALURON 40-30 MG/ML IO SOLN
INTRAOCULAR | Status: DC | PRN
  Administered 2019-04-20: 0.5 mL via INTRAOCULAR

## 2019-04-20 MED ORDER — PROPARACAINE HCL 0.5 % OP SOLN
OPHTHALMIC | Status: AC
  Administered 2019-04-20: 1 [drp] via OPHTHALMIC
  Filled 2019-04-20: qty 15

## 2019-04-20 MED ORDER — OXYCODONE HCL 5 MG PO TABS
5.0000 mg | ORAL_TABLET | Freq: Once | ORAL | Status: AC | PRN
  Administered 2019-04-20: 5 mg via ORAL

## 2019-04-20 MED ORDER — DORZOLAMIDE HCL-TIMOLOL MAL 2-0.5 % OP SOLN
OPHTHALMIC | Status: DC | PRN
  Administered 2019-04-20: 1 [drp] via OPHTHALMIC

## 2019-04-20 MED ORDER — DEXAMETHASONE SODIUM PHOSPHATE 10 MG/ML IJ SOLN
INTRAMUSCULAR | Status: DC | PRN
  Administered 2019-04-20: 5 mg via INTRAVENOUS

## 2019-04-20 MED ORDER — LIDOCAINE HCL (CARDIAC) PF 100 MG/5ML IV SOSY
PREFILLED_SYRINGE | INTRAVENOUS | Status: DC | PRN
  Administered 2019-04-20: 40 mg via INTRAVENOUS

## 2019-04-20 MED ORDER — PHENYLEPHRINE HCL 10 % OP SOLN
1.0000 [drp] | OPHTHALMIC | Status: AC | PRN
  Administered 2019-04-20 (×2): 1 [drp] via OPHTHALMIC

## 2019-04-20 MED ORDER — BACITRACIN-POLYMYXIN B 500-10000 UNIT/GM OP OINT
TOPICAL_OINTMENT | OPHTHALMIC | Status: AC
  Filled 2019-04-20: qty 3.5

## 2019-04-20 MED ORDER — ONDANSETRON HCL 4 MG/2ML IJ SOLN
INTRAMUSCULAR | Status: AC
  Filled 2019-04-20: qty 4

## 2019-04-20 MED ORDER — FENTANYL CITRATE (PF) 100 MCG/2ML IJ SOLN: 25.0000 ug | INTRAMUSCULAR | Status: DC | PRN

## 2019-04-20 MED ORDER — BRILLIANT BLUE G 0.025 % IO SOSY
0.5000 mL | PREFILLED_SYRINGE | INTRAOCULAR | Status: AC
  Administered 2019-04-20: .5 mL via INTRAVITREAL
  Filled 2019-04-20: qty 0.5

## 2019-04-20 MED ORDER — BRIMONIDINE TARTRATE 0.2 % OP SOLN
OPHTHALMIC | Status: AC
  Filled 2019-04-20: qty 5

## 2019-04-20 SURGICAL SUPPLY — 42 items
APL SWBSTK 6 STRL LF DISP (MISCELLANEOUS) ×4
APPLICATOR COTTON TIP 6 STRL (MISCELLANEOUS) ×4 IMPLANT
APPLICATOR COTTON TIP 6IN STRL (MISCELLANEOUS) ×12
BAND WRIST GAS GREEN (MISCELLANEOUS) IMPLANT
BNDG EYE OVAL (GAUZE/BANDAGES/DRESSINGS) ×5 IMPLANT
CANNULA FLEX TIP 25G (CANNULA) ×3 IMPLANT
CLOSURE STERI-STRIP 1/2X4 (GAUZE/BANDAGES/DRESSINGS) ×1
CLSR STERI-STRIP ANTIMIC 1/2X4 (GAUZE/BANDAGES/DRESSINGS) ×2 IMPLANT
DRAPE MICROSCOPE LEICA 46X105 (MISCELLANEOUS) ×3 IMPLANT
DRAPE OPHTHALMIC 77X100 STRL (CUSTOM PROCEDURE TRAY) ×3 IMPLANT
GAS AUTO FILL CONSTEL (OPHTHALMIC) ×3
GAS AUTO FILL CONSTELLATION (OPHTHALMIC) IMPLANT
GAS WRIST BAND GREEN (MISCELLANEOUS)
GAUZE SPONGE 4X4 12PLY STRL (GAUZE/BANDAGES/DRESSINGS) ×2 IMPLANT
GLOVE BIO SURGEON STRL SZ7.5 (GLOVE) ×6 IMPLANT
GLOVE BIOGEL M 7.0 STRL (GLOVE) ×3 IMPLANT
GOWN STRL REUS W/ TWL LRG LVL3 (GOWN DISPOSABLE) ×2 IMPLANT
GOWN STRL REUS W/ TWL XL LVL3 (GOWN DISPOSABLE) ×1 IMPLANT
GOWN STRL REUS W/TWL LRG LVL3 (GOWN DISPOSABLE) ×6
GOWN STRL REUS W/TWL XL LVL3 (GOWN DISPOSABLE) ×3
KIT BASIN OR (CUSTOM PROCEDURE TRAY) ×3 IMPLANT
KIT PERFLUORON PROCEDURE 5ML (MISCELLANEOUS) IMPLANT
LENS VITRECTOMY FLAT OCLR DISP (MISCELLANEOUS) IMPLANT
LOOP FINESSE 25 GA (MISCELLANEOUS) IMPLANT
NDL 18GX1X1/2 (RX/OR ONLY) (NEEDLE) ×1 IMPLANT
NDL 25GX 5/8IN NON SAFETY (NEEDLE) ×3 IMPLANT
NEEDLE 18GX1X1/2 (RX/OR ONLY) (NEEDLE) ×3 IMPLANT
NEEDLE 25GX 5/8IN NON SAFETY (NEEDLE) ×9 IMPLANT
PACK VITRECTOMY CUSTOM (CUSTOM PROCEDURE TRAY) ×3 IMPLANT
PAD ARMBOARD 7.5X6 YLW CONV (MISCELLANEOUS) ×6 IMPLANT
PAK PIK VITRECTOMY CVS 25GA (OPHTHALMIC) ×3 IMPLANT
PROBE ENDO DIATHERMY 25G (MISCELLANEOUS) ×3 IMPLANT
PROBE LASER ILLUM FLEX CVD 25G (OPHTHALMIC) IMPLANT
SHIELD EYE LENSE ONLY DISP (GAUZE/BANDAGES/DRESSINGS) ×3 IMPLANT
SUT VICRYL 7 0 TG140 8 (SUTURE) ×3 IMPLANT
SYR 10ML LL (SYRINGE) ×3 IMPLANT
SYR 20ML LL LF (SYRINGE) ×4 IMPLANT
SYR 5ML LL (SYRINGE) IMPLANT
SYR TB 1ML LUER SLIP (SYRINGE) ×9 IMPLANT
TOWEL GREEN STERILE FF (TOWEL DISPOSABLE) ×3 IMPLANT
TUBING HIGH PRESS EXTEN 6IN (TUBING) ×3 IMPLANT
WATER STERILE IRR 1000ML POUR (IV SOLUTION) ×3 IMPLANT

## 2019-04-20 NOTE — Transfer of Care (Signed)
Immediate Anesthesia Transfer of Care Note  Patient: Catherine Carney  Procedure(s) Performed: 25 GAUGE PARS PLANA VITRECTOMY WITH 20 GAUGE MVR PORT FOR MACULAR HOLE WITH MEMBRANE PEEL (Right Eye) Gas/Fluid Exchange (Right Eye) Photocoagulation With Laser (Right Eye)  Patient Location: PACU  Anesthesia Type:General  Level of Consciousness: awake, alert  and oriented  Airway & Oxygen Therapy: Patient Spontanous Breathing and Patient connected to nasal cannula oxygen  Post-op Assessment: Report given to RN, Post -op Vital signs reviewed and stable and Patient moving all extremities  Post vital signs: Reviewed and stable  Last Vitals:  Vitals Value Taken Time  BP 108/67 04/20/19 1343  Temp    Pulse 82 04/20/19 1349  Resp 12 04/20/19 1349  SpO2 98 % 04/20/19 1349  Vitals shown include unvalidated device data.  Last Pain:  Vitals:   04/20/19 0934  PainSc: 0-No pain         Complications: No apparent anesthesia complications

## 2019-04-20 NOTE — Op Note (Signed)
Date of procedure:  04/20/2019  Surgeon: Bernarda Caffey, MD, PhD  Assistant: Ernest Mallick, Ophthalmic Assistant  Pre-operative Diagnoses: Full thickness macular hole, Right Eye   Post-operative diagnosis: Full thickness macular hole, Right Eye Retinal tears, Right Eye  Anesthesia: General  Procedure:  1. 25 gauge pars plana vitrectomy, Right Eye 2. TissueBlue stain, Right Eye 3. Internal Limiting Membrane peel, Right Eye 4. Endolaser, Right Eye 5. Injection 14% C3F8, Right Eye   Indications for procedure: The patient presented with a full thickness macular hole and complaint of central visual loss consistent with a scotoma. After discussing the risks, benefits, and alternatives to surgery, the patient electively decided to undergo surgical repair and informed consent was obtained. The surgery was an attempt to close the macular hole and potentially improve the vision within the reasonable expectations of the surgeon.  Procedure in Detail:  The patient was met in the pre-operative holding area where their identification data was verified. It was noted that there was a signed, informed consent in the chart and the Right Eye eye was verbally verified by the patient as the operative eye and was marked with a marking pen.The patient was then taken to the operating room and placed in the supine position. General endotracheal anesthesia was induced.  The eye was then prepped with 5% betadine and draped in the normal fashion for ophthalmic surgery. The microscope was draped and swung into position, and a secondary time-out was performed to identify the correct patient, eyes, procedures, and any allergies.  A 25 gauge trocar was inserted in a 30-45 degrees fashion into the inferotemporal quadrant 4 mm posterior to the limbus in this phakic patient. Correct positioning within the vitreous was verified externally with the light pipe.The infusion was then connected to the cannula  and BSS infusion was commenced. Additional ports were placed in the superonasal and superotemporal quadrants.Viscoat was placed on the cornea. The BIOM was used to visualize the posterior segment while the core vitrectomy was completed. The patient had a visible full thickness macular hole and a posterior vitreous detachment was induced using suction over the optic nerve head and lifting anteriorly. The remaining vitreous was removed. Kenalog was used to aid in this process. A thorough peripheral vitrectomy was performed. Of note, there were two retinal tears found inferiorly along the vitreous base at the 5 and 530 positions.   TissueBlue was then used to stain the internal limiting membrane. A macular contact lens was placed on the eye. End-grasping ILM forceps were used to create an opening in the ILM and the ILM was peeled fully from the macula taking care to avoid traction on the macular hole.   The wide angle viewing system was brought back into position. Scleral depression was performed and used to meticulously shave the thick and adherent vitreous base. Endolaser was used to surround the two retinal tears with three rows of laser spots. The endolaser was used to place additional laser in the periphery 360 for prophylaxis. An air fluid exchange was performed.  The superotemporal port was then removed and sutured with 7-0 vicryl, there was no leakage. 14% C3F8 gas was connected to the infusion line and gas was injected into the posterior segment while venting air through the superonasal trocar using the extrusion cannula. Once a full, 40cc of gas was vented through the eye, the infusion port and venting ports were removed and they were sutured with 7-0 vicryl. There was no leakage from the sclerotomy sites.  Subconjunctival injections of  kefzol + bacitracin + polymixin b and kenalog were then administered, and antibiotic and steroid drops as well as antibiotic ointment were placed in the  eye. The drapes were removed and the eye was patched and shielded. A green gas bracelet was placed on the patient's wrist. The patient was then taken to the post-operative area for recovery having tolerated the procedure well. She was instructed to perform face down positioning postoperatively and to follow up in clinic the following morning as scheduled.  Estimate blood lost: none Complications: None

## 2019-04-20 NOTE — Anesthesia Procedure Notes (Signed)
Procedure Name: Intubation Date/Time: 04/20/2019 11:18 AM Performed by: Mosetta Ferdinand T, CRNA Pre-anesthesia Checklist: Patient identified, Emergency Drugs available, Suction available and Patient being monitored Patient Re-evaluated:Patient Re-evaluated prior to induction Oxygen Delivery Method: Circle system utilized Preoxygenation: Pre-oxygenation with 100% oxygen Induction Type: IV induction Ventilation: Mask ventilation without difficulty Laryngoscope Size: Miller and 2 Grade View: Grade I Tube type: Oral Tube size: 7.5 mm Number of attempts: 1 Airway Equipment and Method: Stylet and Oral airway Placement Confirmation: ETT inserted through vocal cords under direct vision,  positive ETCO2 and breath sounds checked- equal and bilateral Secured at: 22 cm Tube secured with: Tape Dental Injury: Teeth and Oropharynx as per pre-operative assessment

## 2019-04-20 NOTE — Brief Op Note (Signed)
04/20/2019  1:43 PM  PATIENT:  Catherine Carney  69 y.o. female  PRE-OPERATIVE DIAGNOSIS:  Full thickness macular hole, right eye  POST-OPERATIVE DIAGNOSIS:  Full thickness macular hole, right eye  PROCEDURE:  Procedure(s): 25 GAUGE PARS PLANA VITRECTOMY WITH 20 GAUGE MVR PORT FOR MACULAR HOLE WITH MEMBRANE PEEL (Right) Gas/Fluid Exchange (Right) Photocoagulation With Laser (Right)  SURGEON:  Surgeon(s) and Role:    Bernarda Caffey, MD - Primary  ASSISTANTS: Ernest Mallick, Ophthalmic Assistant  ANESTHESIA:   general  EBL:  minimal   BLOOD ADMINISTERED:none  DRAINS: none   LOCAL MEDICATIONS USED:  NONE  SPECIMEN:  No Specimen  DISPOSITION OF SPECIMEN:  N/A  COUNTS:  YES  TOURNIQUET:  * No tourniquets in log *  DICTATION: .Note written in EPIC  PLAN OF CARE: Discharge to home after PACU  PATIENT DISPOSITION:  PACU - hemodynamically stable.   Delay start of Pharmacological VTE agent (>24hrs) due to surgical blood loss or risk of bleeding: not applicable

## 2019-04-20 NOTE — Anesthesia Preprocedure Evaluation (Signed)
Anesthesia Evaluation  Patient identified by MRN, date of birth, ID band Patient awake    Reviewed: Allergy & Precautions, NPO status , Patient's Chart, lab work & pertinent test results  Airway Mallampati: II  TM Distance: >3 FB Neck ROM: Full    Dental  (+) Edentulous Upper, Dental Advisory Given   Pulmonary    breath sounds clear to auscultation       Cardiovascular hypertension,  Rhythm:Regular Rate:Normal     Neuro/Psych    GI/Hepatic   Endo/Other    Renal/GU      Musculoskeletal   Abdominal   Peds  Hematology   Anesthesia Other Findings   Reproductive/Obstetrics                             Anesthesia Physical Anesthesia Plan  ASA: III  Anesthesia Plan: General   Post-op Pain Management:    Induction: Intravenous  PONV Risk Score and Plan: Ondansetron and Dexamethasone  Airway Management Planned: Oral ETT  Additional Equipment:   Intra-op Plan:   Post-operative Plan: Extubation in OR  Informed Consent: I have reviewed the patients History and Physical, chart, labs and discussed the procedure including the risks, benefits and alternatives for the proposed anesthesia with the patient or authorized representative who has indicated his/her understanding and acceptance.     Dental advisory given  Plan Discussed with: CRNA and Anesthesiologist  Anesthesia Plan Comments:         Anesthesia Quick Evaluation

## 2019-04-20 NOTE — Interval H&P Note (Signed)
History and Physical Interval Note:  04/20/2019 10:20 AM  Catherine Carney  has presented today for surgery, with the diagnosis of Full thickness macular hole, right eye.  The various methods of treatment have been discussed with the patient and family. After consideration of risks, benefits and other options for treatment, the patient has consented to  Procedure(s): 25 GAUGE PARS PLANA VITRECTOMY WITH 20 GAUGE MVR PORT FOR MACULAR HOLE (Right) as a surgical intervention.  The patient's history has been reviewed, patient examined, no change in status, stable for surgery.  I have reviewed the patient's chart and labs.  Questions were answered to the patient's satisfaction.     Bernarda Caffey

## 2019-04-20 NOTE — Anesthesia Postprocedure Evaluation (Signed)
Anesthesia Post Note  Patient: Catherine Carney  Procedure(s) Performed: 25 GAUGE PARS PLANA VITRECTOMY WITH 20 GAUGE MVR PORT FOR MACULAR HOLE WITH MEMBRANE PEEL (Right Eye) Gas/Fluid Exchange (Right Eye) Photocoagulation With Laser (Right Eye)     Patient location during evaluation: PACU Anesthesia Type: General Level of consciousness: awake and alert Pain management: pain level controlled Vital Signs Assessment: post-procedure vital signs reviewed and stable Respiratory status: spontaneous breathing, nonlabored ventilation, respiratory function stable and patient connected to nasal cannula oxygen Cardiovascular status: blood pressure returned to baseline and stable Postop Assessment: no apparent nausea or vomiting Anesthetic complications: no    Last Vitals:  Vitals:   04/20/19 1441 04/20/19 1442  BP:  112/69  Pulse: 84 78  Resp: 18 (!) 22  Temp:  37.2 C  SpO2: 97% 96%    Last Pain:  Vitals:   04/20/19 1343  PainSc: 0-No pain                 Ary Lavine COKER

## 2019-04-21 ENCOUNTER — Ambulatory Visit (INDEPENDENT_AMBULATORY_CARE_PROVIDER_SITE_OTHER): Payer: Medicare HMO | Admitting: Ophthalmology

## 2019-04-21 DIAGNOSIS — H3581 Retinal edema: Secondary | ICD-10-CM

## 2019-04-21 DIAGNOSIS — I10 Essential (primary) hypertension: Secondary | ICD-10-CM

## 2019-04-21 DIAGNOSIS — H35341 Macular cyst, hole, or pseudohole, right eye: Secondary | ICD-10-CM

## 2019-04-21 DIAGNOSIS — H35033 Hypertensive retinopathy, bilateral: Secondary | ICD-10-CM

## 2019-04-21 DIAGNOSIS — H25813 Combined forms of age-related cataract, bilateral: Secondary | ICD-10-CM

## 2019-04-28 ENCOUNTER — Encounter (INDEPENDENT_AMBULATORY_CARE_PROVIDER_SITE_OTHER): Payer: Self-pay | Admitting: Ophthalmology

## 2019-04-28 ENCOUNTER — Ambulatory Visit (INDEPENDENT_AMBULATORY_CARE_PROVIDER_SITE_OTHER): Payer: Medicare HMO | Admitting: Ophthalmology

## 2019-04-28 ENCOUNTER — Other Ambulatory Visit: Payer: Self-pay

## 2019-04-28 DIAGNOSIS — H35033 Hypertensive retinopathy, bilateral: Secondary | ICD-10-CM

## 2019-04-28 DIAGNOSIS — I10 Essential (primary) hypertension: Secondary | ICD-10-CM

## 2019-04-28 DIAGNOSIS — H25813 Combined forms of age-related cataract, bilateral: Secondary | ICD-10-CM

## 2019-04-28 DIAGNOSIS — H3581 Retinal edema: Secondary | ICD-10-CM

## 2019-04-28 DIAGNOSIS — H35341 Macular cyst, hole, or pseudohole, right eye: Secondary | ICD-10-CM

## 2019-04-28 NOTE — Progress Notes (Signed)
[facility] Note  04/28/2019     CHIEF COMPLAINT Patient presents for Post-op Follow-up   HISTORY OF PRESENT ILLNESS: Catherine Carney is a 69 y.o. female who presents to the clinic today for:   HPI    Post-op Follow-up    In right eye.  Discomfort includes none.  Vision is stable.  I, the attending physician,  performed the HPI with the patient and updated documentation appropriately.          Comments    Pt states vision is about the same OD.  Pt denies eye pain or discomfort and denies any new or worsening floaters or fol OU.       Last edited by Bernarda Caffey, MD on 04/28/2019 10:20 AM. (History)    pt is with her sister today, she states she is doing more than 97mns/hr of face down time   Referring physician: BChesley Noon MD [address]  [place] [id number] HISTORICAL INFORMATION:   Selected notes from the MEDICAL RECORD NUMBER Referred by Dr TRadford Paxfor retina eval   CURRENT MEDICATIONS: No current outpatient medications on file. (Ophthalmic Drugs)   No current facility-administered medications for this visit. (Ophthalmic Drugs)   Current Outpatient Medications (Other)  Medication Sig  . acetaminophen (TYLENOL) 500 MG tablet Take 500 mg by mouth every 6 (six) hours as needed (for pain.).  .Marland KitchenamLODipine (NORVASC) 10 MG tablet Take 10 mg by mouth daily.   .Marland Kitchenaspirin EC 81 MG tablet Take 81 mg by mouth daily.  . Calcium Carb-Cholecalciferol (CALCIUM + D3 PO) Take 1 tablet by mouth daily.  . Coenzyme Q10 (COQ10) 100 MG CAPS Take 100 mg by mouth daily.  . fluconazole (DIFLUCAN) 150 MG tablet Take 1 tablet (150 mg total) by mouth every 3 (three) days.  .Marland Kitchenlisinopril (ZESTRIL) 20 MG tablet Take 20 mg by mouth daily.  . Multiple Vitamin (MULTIVITAMIN WITH MINERALS) TABS tablet Take 1 tablet by mouth daily.  .Marland Kitchennystatin-triamcinolone ointment (MYCOLOG) Apply 1 application topically 2 (two) times daily. To affected area. (Patient  not taking: Reported on 04/14/2019)  . Omega 3-6-9 Fatty Acids (TRIPLE OMEGA-3-6-9 PO) Take 1 capsule by mouth daily.  . rosuvastatin (CRESTOR) 40 MG tablet Take 20 mg by mouth at bedtime.   . vitamin B-12 (CYANOCOBALAMIN) 500 MCG tablet Take 500 mcg by mouth daily.   No current facility-administered medications for this visit. (Other)      REVIEW OF SYSTEMS: ROS    Positive for: Musculoskeletal, Eyes   Negative for: Constitutional, Gastrointestinal, Neurological, Skin, Genitourinary, HENT, Endocrine, Cardiovascular, Respiratory, Psychiatric, Allergic/Imm, Heme/Lymph   Last edited by EDoneen Poissonon 04/28/2019 10:08 AM. (History)       ALLERGIES No Known Allergies  PAST MEDICAL HISTORY Past Medical History:  Diagnosis Date  . Cataract    Combined form OU  . Family history of adverse reaction to anesthesia    sister had PONV  . Female bladder prolapse   . Hyperlipidemia   . Hypertension   . Hypertensive retinopathy    OU  . Macular hole of right eye   . Osteoarthritis    Past Surgical History:  Pro[place] Date  . [place] 20 [place] FOR MACULAR HOLE Right 04/20/2019   Procedure: 25 GAUGE PARS PLANA VITRECTOMY WITH 20 GAUGE MVR PORT FOR MACULAR HOLE WITH MEMBRANE PEEL;  Surgeon: ZBernarda Caffey MD;  [facility]  Service: Ophthalmology;  Laterality: Right;  . CHOLECYSTECTOMY    . EYE SURGERY Right 04/20/2019   [facility] repair - PPV/MP - Dr. Bernarda Caffey  . GALLBLADDER SURGERY    . GAS/FLUID EXCHANGE Right 04/20/2019   Procedure: Gas/Fluid Exchange;  Surgeon: Bernarda Caffey, MD;  Location: [facility];  Service: Ophthalmology;  Laterality: Right;  . PHOTOCOAGULATION WITH LASER Right 04/20/2019   Procedure: Photocoagulation With Laser;  Surgeon: Bernarda Caffey, MD;  Location: [facility];  Service: Ophthalmology;  Laterality: Right;  . TUBAL LIGATION      FAMILY HISTORY Family History  Problem Relation Age of Onset  . Heart attack  Maternal Grandmother   . Cancer Maternal Grandfather   . Cancer Father        lung  . Other Mother        irregular heart beat  . Hypertension Mother   . Diabetes Brother   . Hypertension Brother   . High Cholesterol Brother   . Atrial fibrillation Brother   . Melanoma Brother        right ear  . Hypertension Sister   . High Cholesterol Sister   . Colon cancer Sister   . Rheum arthritis Sister   . Other Sister        irregular heart beat  . Hypertension Son   . Cancer Other        maternal side  . Stroke Other        maternal side    SOCIAL HISTORY Social History   Tobacco Use  . Smoking status: Never Smoker  . Smokeless tobacco: Never Used  Substance Use Topics  . Alcohol use: No    Alcohol/week: 0.0 standard drinks  . Drug use: No         OPHTHALMIC EXAM:  Base Eye Exam    Visual Acuity (Snellen - Linear)      Right Left   Dist [place] CF @ face 20/30 -1   Dist ph [place] NI NI       Tonometry (Tonopen, 10:07 AM)      Right Left   Pressure 13 def       Pupils      Dark Light Shape React APD   Right 6 6 Round Dilated 0   Left 3 2 Round Minimal 0       Visual Fields      Left Right    Full    Restrictions  Total superior temporal, inferior temporal, superior nasal, inferior nasal deficiencies  CF @ face OD       Extraocular Movement      Right Left    Full Full       Neuro/Psych    Oriented x3: Yes   Mood/Affect: Normal       Dilation    Right eye: 1.0% Mydriacyl, 2.5% Phenylephrine @ 10:07 AM        Slit Lamp and Fundus Exam    External Exam      Right Left   External Periorbital edema improved        Slit Lamp Exam      Right Left   Lids/Lashes Dermatochalasis - upper lid, Meibomian gland dysfunction Dermatochalasis - upper lid, Meibomian gland dysfunction   Conjunctiva/Sclera Trace Injection, Subconjunctival hemorrhage resolved, sutures intact White and quiet   Cornea  1-2+ Punctate epithelial erosions,+ointment Trace Punctate  epithelial erosions   Anterior Chamber Deep and quiet Deep and quiet   Iris Round and moderately dilated Round  and dilated   Lens 1-2+ Nuclear sclerosis, 2+ Cortical cataract, 2-3+PC feathering 2+ Nuclear sclerosis, 2+ Cortical cataract   Vitreous post vitrectomy, 95% gas fill Vitreous syneresis       Fundus Exam      Right Left   Disc perfused    C/D Ratio 0.5 0.2   Macula flat under gas, mac hole closing    Vessels Vascular attenuation, mild Tortuousity Vascular attenuation, mild Tortuousity   Periphery hazy view, attached, good 360 laser changes Attached, no heme          IMAGING AND PROCEDURES  Imaging and Procedures for _0 @           ASSESSMENT/PLAN:    ICD-10-CM   1. Macular hole of right eye  H35.341   2. Retinal edema  H35.81   3. Essential hypertension  I10   4. Hypertensive retinopathy of both eyes  H35.033   5. Combined forms of age-related cataract of both eyes  H25.813     1,2. Full thickness macular hole OD  - now POW1 s/p PPV/TissueBlue stain/MP/14% C3F8 OD, 03.12.21             - doing well this morning             - retina attached and good gas bubble in place w/ mac hole closing             - IOP 13  - 95% gas fill             - dec PF to qid OD  - cont zymaxid QID OD -- stop when bottle runs out                         Atropine BID OD -- stop when bottle runs out                         PSO ung QID OD -- bedtime/PRN             - cont face down positioning 50% of time; avoid laying flat on back              - eye shield when sleeping x1 more week             - post op drop and positioning instructions reviewed              - tylenol/ibuprofen for pain  - f/u 3 weeks -- POV, DFE/OCT  3,4. Hypertensive retinopathy OU  - discussed importance of tight BP control  - monitor  5. Mixed form age related cataract OU  - The symptoms of cataract, surgical options, and treatments and risks were discussed with patient.  - discussed diagnosis and  progression  - specifically discussed likelihood of cataract progression post-vitrectomy -- +PC feathering OD  - monitor   Ophthalmic Meds Ordered this visit:  No orders of the defined types were placed in this encounter.      Return in about 3 weeks (around 05/19/2019) for f/u [place] OD, DFE, OCT.  There are no Patient Instructions on file for this visit.   Explained the diagnoses, plan, and follow up with the patient and they expressed understanding.  Patient expressed understanding of the importance of proper follow up care.   This document serves as a record of services personally performed by Gardiner Sleeper, MD, PhD. It was created on their behalf by Estill Bamberg  Owens Shark OA, an ophthalmic assistant. The creation of this record is the provider's dictation and/or activities during the visit.   Electronically signed by: Ernest Mallick, OA 03.19.2021 12:12 PM   Gardiner Sleeper, M.D., Ph.D. Diseases & Surgery of the Retina and Vitreous [facility]  I have reviewed the above documentation for accuracy and completeness, and I agree with the above. Gardiner Sleeper, M.D., Ph.D. 04/28/19 12:12 PM   Abbreviations: M myopia (nearsighted); A astigmatism; H hyperopia (farsighted); P presbyopia; Mrx spectacle prescription;  CTL contact lenses; OD right eye; OS left eye; OU both eyes  XT exotropia; ET esotropia; PEK punctate epithelial keratitis; PEE punctate epithelial erosions; DES dry eye syndrome; MGD meibomian gland dysfunction; ATs artificial tears; PFAT's preservative free artificial tears; [place] nuclear sclerotic cataract; PSC posterior subcapsular cataract; ERM epi-retinal membrane; PVD posterior vitreous detachment; RD retinal detachment; DM diabetes mellitus; DR diabetic retinopathy; NPDR non-proliferative diabetic retinopathy; PDR proliferative diabetic retinopathy; CSME clinically significant macular edema; DME diabetic macular edema; dbh dot blot hemorrhages; CWS cotton  wool spot; POAG primary open angle glaucoma; C/D cup-to-disc ratio; HVF humphrey visual field; GVF goldmann visual field; OCT optical coherence tomography; IOP intraocular pressure; BRVO Branch retinal vein occlusion; CRVO central retinal vein occlusion; CRAO central retinal artery occlusion; BRAO branch retinal artery occlusion; RT retinal tear; SB scleral buckle; PPV pars plana vitrectomy; VH Vitreous hemorrhage; PRP panretinal laser photocoagulation; IVK intravitreal kenalog; VMT vitreomacular traction; MH Macular hole;  NVD neovascularization of the disc; NVE neovascularization elsewhere; AREDS age related eye disease study; ARMD age related macular degeneration; POAG primary open angle glaucoma; EBMD epithelial/anterior basement membrane dystrophy; ACIOL anterior chamber intraocular lens; IOL intraocular lens; PCIOL posterior chamber intraocular lens; Phaco/IOL phacoemulsification with intraocular lens placement; [place] photorefractive keratectomy; LASIK laser assisted in situ keratomileusis; HTN hypertension; DM diabetes mellitus; COPD chronic obstructive pulmonary disease

## 2019-05-15 NOTE — Progress Notes (Signed)
[facility] Note  05/18/2019     CHIEF COMPLAINT Patient presents for Post-op Follow-up   HISTORY OF PRESENT ILLNESS: Catherine Carney is a 69 y.o. female who presents to the clinic today for:   HPI    Post-op Follow-up    In right eye.  Discomfort includes floaters.  Vision is improved, is blurred at distance and is blurred at near.  I, the attending physician,  performed the HPI with the patient and updated documentation appropriately.          Comments    69 y/o female pt here for 3 wk POV.  S/p PPV for [facility] repair OD 3.11.21.  VA OD gradually improving, though still blurred; looks like shes "looking through water."  No change in [place] OS.  Denies pain, FOL.  Has floaters OU.  Pred QID OD.  PSO ung QHS and prn OD.       Last edited by Bernarda Caffey, MD on 05/18/2019  9:01 AM. (History)    pt is with her sister today, pt states her vision is improving and the gas bubble is shrinking, she is using PF QID and PSO Ung at night and as needed during the day   Referring physician: Chesley Noon, MD [place],  [place] [address]  HISTORICAL INFORMATION:   Selected notes from the MEDICAL RECORD NUMBER Referred by Dr Radford Pax for retina eval   CURRENT MEDICATIONS: Current Outpatient Medications (Ophthalmic Drugs)  Medication Sig  . prednisoLONE acetate (PRED FORTE) 1 % ophthalmic suspension Place 1 drop into the right eye 3 (three) times daily.   No current facility-administered medications for this visit. (Ophthalmic Drugs)   Current Outpatient Medications (Other)  Medication Sig  . acetaminophen (TYLENOL) 500 MG tablet Take 500 mg by mouth every 6 (six) hours as needed (for pain.).  Marland Kitchen amLODipine (NORVASC) 10 MG tablet Take 10 mg by mouth daily.   Marland Kitchen aspirin EC 81 MG tablet Take 81 mg by mouth daily.  . Calcium Carb-Cholecalciferol (CALCIUM + D3 PO) Take 1 tablet by mouth daily.  . Coenzyme Q10 (COQ10) 100 MG CAPS Take 100 mg by mouth  daily.  . fluconazole (DIFLUCAN) 150 MG tablet Take 1 tablet (150 mg total) by mouth every 3 (three) days.  Marland Kitchen lisinopril (ZESTRIL) 20 MG tablet Take 20 mg by mouth daily.  . Multiple Vitamin (MULTIVITAMIN WITH MINERALS) TABS tablet Take 1 tablet by mouth daily.  Marland Kitchen nystatin-triamcinolone ointment (MYCOLOG) Apply 1 application topically 2 (two) times daily. To affected area. (Patient not taking: Reported on 04/14/2019)  . Omega 3-6-9 Fatty Acids (TRIPLE OMEGA-3-6-9 PO) Take 1 capsule by mouth daily.  . rosuvastatin (CRESTOR) 40 MG tablet Take 20 mg by mouth at bedtime.   . vitamin B-12 (CYANOCOBALAMIN) 500 MCG tablet Take 500 mcg by mouth daily.   No current facility-administered medications for this visit. (Other)      REVIEW OF SYSTEMS: ROS    Positive for: Musculoskeletal, Eyes   Negative for: Constitutional, Gastrointestinal, Neurological, Skin, Genitourinary, HENT, Endocrine, Cardiovascular, Respiratory, Psychiatric, Allergic/Imm, Heme/Lymph   Last edited by Matthew Folks, COA on 05/18/2019  8:37 AM. (History)       ALLERGIES No Known Allergies  PAST MEDICAL HISTORY Past Medical History:  Diagnosis Date  . Cataract    Combined form OU  . Family history of adverse reaction to anesthesia    sister had PONV  . Female bladder prolapse   .  Hyperlipidemia   . Hypertension   . Hypertensive retinopathy    OU  . Macular hole of right eye   . Osteoarthritis    Past Surgical History:  Procedure Laterality Date  . [place] VITRECTOMY WITH 20 GAUGE MVR PORT FOR MACULAR HOLE Right 04/20/2019   Procedure: 25 GAUGE PARS PLANA VITRECTOMY WITH 20 GAUGE MVR PORT FOR MACULAR HOLE WITH MEMBRANE PEEL;  Surgeon: Bernarda Caffey, MD;  Location: [facility];  Service: Ophthalmology;  Laterality: Right;  . CHOLECYSTECTOMY    . EYE SURGERY Right 04/20/2019   [facility] repair - PPV/MP - Dr. Bernarda Caffey  . GALLBLADDER SURGERY    . GAS/FLUID EXCHANGE Right 04/20/2019   Procedure: Gas/Fluid  Exchange;  Surgeon: Bernarda Caffey, MD;  Location: [facility];  Service: Ophthalmology;  Laterality: Right;  . PHOTOCOAGULATION WITH LASER Right 04/20/2019   Procedure: Photocoagulation With Laser;  Surgeon: Bernarda Caffey, MD;  Location: [facility];  Service: Ophthalmology;  Laterality: Right;  . TUBAL LIGATION      FAMILY HISTORY Family History  Problem Relation Age of Onset  . Heart attack Maternal Grandmother   . Cancer Maternal Grandfather   . Cancer Father        lung  . Other Mother        irregular heart beat  . Hypertension Mother   . Diabetes Brother   . Hypertension Brother   . High Cholesterol Brother   . Atrial fibrillation Brother   . Melanoma Brother        right ear  . Hypertension Sister   . High Cholesterol Sister   . Colon cancer Sister   . Rheum arthritis Sister   . Other Sister        irregular heart beat  . Hypertension Son   . Cancer Other        maternal side  . Stroke Other        maternal side    SOCIAL HISTORY Social History   Tobacco Use  . Smoking status: Never Smoker  . Smokeless tobacco: Never Used  Substance Use Topics  . Alcohol use: No    Alcohol/week: 0.0 standard drinks  . Drug use: No         OPHTHALMIC EXAM:  Base Eye Exam    Visual Acuity (Snellen - Linear)      Right Left   Dist [place] 20/150 - 20/40 +2   Dist ph [place] NI NI       Tonometry (Tonopen, 8:40 AM)      Right Left   Pressure 11 12       Pupils      Dark Light Shape React APD   Right 5 5 Round Minimal None   Left 3 2 Round Minimal None       Visual Fields (Counting fingers)      Left Right    Full Full       Extraocular Movement      Right Left    Full, Ortho Full, Ortho       Neuro/Psych    Oriented x3: Yes   Mood/Affect: Normal       Dilation    Right eye: 1.0% Mydriacyl, 2.5% Phenylephrine @ 8:40 AM        Slit Lamp and Fundus Exam    External Exam      Right Left   External Periorbital edema improved        Slit Lamp Exam  Right Left    Lids/Lashes Dermatochalasis - upper lid, Meibomian gland dysfunction Dermatochalasis - upper lid, Meibomian gland dysfunction   Conjunctiva/Sclera 1-2+Injection, sutures disolving White and quiet   Cornea 2+ Punctate epithelial erosions,+ointment, irregular tear film, Debris in tear film Trace Punctate epithelial erosions   Anterior Chamber Deep and quiet Deep and quiet   Iris Round and moderately dilated Round and dilated   Lens 2+ Nuclear sclerosis with early brunescence, 2+ Cortical cataract, 1-2+ Posterior subcapsular cataract 2+ Nuclear sclerosis, 2+ Cortical cataract   Vitreous post vitrectomy, 45% gas fill Vitreous syneresis       Fundus Exam      Right Left   Disc hazy view, but perfused    C/D Ratio 0.5 0.2   Macula Hazy view; flat; mac hole closed    Vessels Vascular attenuation, mild Tortuousity Vascular attenuation, mild Tortuousity   Periphery hazy view, attached, good 360 laser changes Attached, no heme          IMAGING AND PROCEDURES  Imaging and Procedures for _0 @  OCT, Retina - OU - Both Eyes       Right Eye Quality was borderline. Central Foveal Thickness: 325. Progression has improved. Findings include no SRF, abnormal foveal contour, no IRF (Mac hole closed).   Left Eye Quality was good. Central Foveal Thickness: 281. Progression has worsened. Findings include no SRF, abnormal foveal contour, vitreous traction, intraretinal fluid (Trace progression of VMT with persistent cystic changes).   Notes *Images captured and stored on drive  Diagnosis / Impression:  OD: Mac hole closed; abnormal foveal profile; no IRF/SRF OS: Trace progression of VMT with persistent cystic changes  Clinical management:  See below  Abbreviations: NFP - Normal foveal profile. CME - cystoid macular edema. PED - pigment epithelial detachment. IRF - intraretinal fluid. SRF - subretinal fluid. EZ - ellipsoid zone. ERM - epiretinal membrane. ORA - outer retinal atrophy. ORT -  outer retinal tubulation. SRHM - subretinal hyper-reflective material                 ASSESSMENT/PLAN:    ICD-10-CM   1. Macular hole of right eye  H35.341   2. Retinal edema  H35.81 OCT, Retina - OU - Both Eyes  3. Essential hypertension  I10   4. Hypertensive retinopathy of both eyes  H35.033   5. Combined forms of age-related cataract of both eyes  H25.813     1,2. Full thickness macular hole OD  - POW4 s/p PPV/TissueBlue stain/MP/14% C3F8 OD, 03.11.21             - doing well             - mac hole closed             - IOP 11  - 45% gas fill             - dec PF to TID OD             - PSO ung PRN  - add AT's QID OD             - avoid laying flat on back              - post op drop and positioning instructions reviewed   - f/u 4 weeks -- POV, DFE/OCT  3,4. Hypertensive retinopathy OU  - discussed importance of tight BP control  - monitor  5. Mixed form age related cataract OU  - The symptoms of cataract,  surgical options, and treatments and risks were discussed with patient.  - discussed diagnosis and progression  - specifically discussed likelihood of cataract progression post-vitrectomy -- +PSC OD  - monitor   Ophthalmic Meds Ordered this visit:  Meds ordered this encounter  Medications  . prednisoLONE acetate (PRED FORTE) 1 % ophthalmic suspension    Sig: Place 1 drop into the right eye 3 (three) times daily.    Dispense:  15 mL    Refill:  0       Return in about 4 weeks (around 06/15/2019) for f/u [place] OD, DFE, OCT.  There are no Patient Instructions on file for this visit.   Explained the diagnoses, plan, and follow up with the patient and they expressed understanding.  Patient expressed understanding of the importance of proper follow up care.   This document serves as a record of services personally performed by Gardiner Sleeper, MD, PhD. It was created on their behalf by Leeann Must, [place], a certified ophthalmic assistant. The creation of  this record is the provider's dictation and/or activities during the visit.    Electronically signed by: Leeann Must, COA _0 @ 10:12 AM   This document serves as a record of services personally performed by Gardiner Sleeper, MD, PhD. It was created on their behalf by Ernest Mallick, OA, an ophthalmic assistant. The creation of this record is the provider's dictation and/or activities during the visit.    Electronically signed by: Ernest Mallick, OA 04.08.2021 10:12 AM   Gardiner Sleeper, M.D., Ph.D. Diseases & Surgery of the Retina and Vitreous [facility] [place]  I have reviewed the above documentation for accuracy and completeness, and I agree with the above. Gardiner Sleeper, M.D., Ph.D. 05/18/19 10:12 AM    Abbreviations: M myopia (nearsighted); A astigmatism; H hyperopia (farsighted); P presbyopia; Mrx spectacle prescription;  CTL contact lenses; OD right eye; OS left eye; OU both eyes  XT exotropia; ET esotropia; PEK punctate epithelial keratitis; PEE punctate epithelial erosions; DES dry eye syndrome; MGD meibomian gland dysfunction; ATs artificial tears; PFAT's preservative free artificial tears; [place] nuclear sclerotic cataract; PSC posterior subcapsular cataract; ERM epi-retinal membrane; PVD posterior vitreous detachment; RD retinal detachment; DM diabetes mellitus; DR diabetic retinopathy; NPDR non-proliferative diabetic retinopathy; PDR proliferative diabetic retinopathy; CSME clinically significant macular edema; DME diabetic macular edema; dbh dot blot hemorrhages; CWS cotton wool spot; POAG primary open angle glaucoma; C/D cup-to-disc ratio; HVF humphrey visual field; GVF goldmann visual field; OCT optical coherence tomography; IOP intraocular pressure; BRVO Branch retinal vein occlusion; CRVO central retinal vein occlusion; CRAO central retinal artery occlusion; BRAO branch retinal artery occlusion; RT retinal tear; SB scleral buckle; PPV pars plana vitrectomy; VH  Vitreous hemorrhage; PRP panretinal laser photocoagulation; IVK intravitreal kenalog; VMT vitreomacular traction; MH Macular hole;  NVD neovascularization of the disc; NVE neovascularization elsewhere; AREDS age related eye disease study; ARMD age related macular degeneration; POAG primary open angle glaucoma; EBMD epithelial/anterior basement membrane dystrophy; ACIOL anterior chamber intraocular lens; IOL intraocular lens; PCIOL posterior chamber intraocular lens; Phaco/IOL phacoemulsification with intraocular lens placement; [place] photorefractive keratectomy; LASIK laser assisted in situ keratomileusis; HTN hypertension; DM diabetes mellitus; COPD chronic obstructive pulmonary disease

## 2019-05-18 ENCOUNTER — Encounter (INDEPENDENT_AMBULATORY_CARE_PROVIDER_SITE_OTHER): Payer: Self-pay | Admitting: Ophthalmology

## 2019-05-18 ENCOUNTER — Ambulatory Visit (INDEPENDENT_AMBULATORY_CARE_PROVIDER_SITE_OTHER): Payer: Medicare HMO | Admitting: Ophthalmology

## 2019-05-18 ENCOUNTER — Other Ambulatory Visit: Payer: Self-pay

## 2019-05-18 DIAGNOSIS — H3581 Retinal edema: Secondary | ICD-10-CM

## 2019-05-18 DIAGNOSIS — H35033 Hypertensive retinopathy, bilateral: Secondary | ICD-10-CM

## 2019-05-18 DIAGNOSIS — I10 Essential (primary) hypertension: Secondary | ICD-10-CM

## 2019-05-18 DIAGNOSIS — H35341 Macular cyst, hole, or pseudohole, right eye: Secondary | ICD-10-CM

## 2019-05-18 DIAGNOSIS — H25813 Combined forms of age-related cataract, bilateral: Secondary | ICD-10-CM

## 2019-05-18 MED ORDER — PREDNISOLONE ACETATE 1 % OP SUSP: 1.0000 [drp] | Freq: Three times a day (TID) | OPHTHALMIC | 0 refills | Status: DC

## 2019-05-19 ENCOUNTER — Encounter (INDEPENDENT_AMBULATORY_CARE_PROVIDER_SITE_OTHER): Payer: Medicare HMO | Admitting: Ophthalmology

## 2019-06-12 NOTE — Progress Notes (Signed)
[facility] Note  06/15/2019     CHIEF COMPLAINT Patient presents for Post-op Follow-up   HISTORY OF PRESENT ILLNESS: Catherine Carney is a 69 y.o. female who presents to the clinic today for:   HPI    Post-op Follow-up    In right eye.  Vision is stable, is blurred at distance and is blurred at near.  I, the attending physician,  performed the HPI with the patient and updated documentation appropriately.          Comments    69 y/o female pt here for 4 wk pov.  S/p PPV for mac hole repair OD 3.11.21.  No change in [place] OU.  Denies pain, FOL, floaters.  PF TID OD PSO ung prn OD AT QID OD       Last edited by Bernarda Caffey, MD on 06/15/2019  8:34 AM. (History)    pt states she is doing well, she can still see the gas bubble   Referring physician: Chesley Noon, MD [place],  [place] [address]  HISTORICAL INFORMATION:   Selected notes from the MEDICAL RECORD NUMBER Referred by Dr Radford Pax for retina eval   CURRENT MEDICATIONS: Current Outpatient Medications (Ophthalmic Drugs)  Medication Sig  . prednisoLONE acetate (PRED FORTE) 1 % ophthalmic suspension Place 1 drop into the right eye 3 (three) times daily.   No current facility-administered medications for this visit. (Ophthalmic Drugs)   Current Outpatient Medications (Other)  Medication Sig  . acetaminophen (TYLENOL) 500 MG tablet Take 500 mg by mouth every 6 (six) hours as needed (for pain.).  Marland Kitchen amLODipine (NORVASC) 10 MG tablet Take 10 mg by mouth daily.   Marland Kitchen aspirin EC 81 MG tablet Take 81 mg by mouth daily.  . Calcium Carb-Cholecalciferol (CALCIUM + D3 PO) Take 1 tablet by mouth daily.  . Coenzyme Q10 (COQ10) 100 MG CAPS Take 100 mg by mouth daily.  . fluconazole (DIFLUCAN) 150 MG tablet Take 1 tablet (150 mg total) by mouth every 3 (three) days.  Marland Kitchen lisinopril (ZESTRIL) 20 MG tablet Take 20 mg by mouth daily.  . Multiple Vitamin (MULTIVITAMIN WITH MINERALS) TABS tablet  Take 1 tablet by mouth daily.  Marland Kitchen nystatin-triamcinolone ointment (MYCOLOG) Apply 1 application topically 2 (two) times daily. To affected area. (Patient not taking: Reported on 04/14/2019)  . Omega 3-6-9 Fatty Acids (TRIPLE OMEGA-3-6-9 PO) Take 1 capsule by mouth daily.  . rosuvastatin (CRESTOR) 40 MG tablet Take 20 mg by mouth at bedtime.   . vitamin B-12 (CYANOCOBALAMIN) 500 MCG tablet Take 500 mcg by mouth daily.   No current facility-administered medications for this visit. (Other)      REVIEW OF SYSTEMS: ROS    Positive for: Eyes   Negative for: Constitutional, Gastrointestinal, Neurological, Skin, Genitourinary, Musculoskeletal, HENT, Endocrine, Cardiovascular, Respiratory, Psychiatric, Allergic/Imm, Heme/Lymph   Last edited by Matthew Folks, COA on 06/15/2019  8:03 AM. (History)       ALLERGIES No Known Allergies  PAST MEDICAL HISTORY Past Medical History:  Diagnosis Date  . Cataract    Combined form OU  . Family history of adverse reaction to anesthesia    sister had PONV  . Female bladder prolapse   . Hyperlipidemia   . Hypertension   . Hypertensive retinopathy    OU  . Macular hole of right eye   . Osteoarthritis    Past Surgical History:  Procedure Laterality Date  . [place]  VITRECTOMY WITH 20 GAUGE MVR PORT FOR MACULAR HOLE Right 04/20/2019   Procedure: 25 GAUGE PARS PLANA VITRECTOMY WITH 20 GAUGE MVR PORT FOR MACULAR HOLE WITH MEMBRANE PEEL;  Surgeon: Bernarda Caffey, MD;  Location: [facility];  Service: Ophthalmology;  Laterality: Right;  . CHOLECYSTECTOMY    . EYE SURGERY Right 04/20/2019   [facility] repair - PPV/MP - Dr. Bernarda Caffey  . GALLBLADDER SURGERY    . GAS/FLUID EXCHANGE Right 04/20/2019   Procedure: Gas/Fluid Exchange;  Surgeon: Bernarda Caffey, MD;  Location: [facility];  Service: Ophthalmology;  Laterality: Right;  . PHOTOCOAGULATION WITH LASER Right 04/20/2019   Procedure: Photocoagulation With Laser;  Surgeon: Bernarda Caffey, MD;  Location: [facility];   Service: Ophthalmology;  Laterality: Right;  . TUBAL LIGATION      FAMILY HISTORY Family History  Problem Relation Age of Onset  . Heart attack Maternal Grandmother   . Cancer Maternal Grandfather   . Cancer Father        lung  . Other Mother        irregular heart beat  . Hypertension Mother   . Diabetes Brother   . Hypertension Brother   . High Cholesterol Brother   . Atrial fibrillation Brother   . Melanoma Brother        right ear  . Hypertension Sister   . High Cholesterol Sister   . Colon cancer Sister   . Rheum arthritis Sister   . Other Sister        irregular heart beat  . Hypertension Son   . Cancer Other        maternal side  . Stroke Other        maternal side    SOCIAL HISTORY Social History   Tobacco Use  . Smoking status: Never Smoker  . Smokeless tobacco: Never Used  Substance Use Topics  . Alcohol use: No    Alcohol/week: 0.0 standard drinks  . Drug use: No         OPHTHALMIC EXAM:  Base Eye Exam    Visual Acuity (Snellen - Linear)      Right Left   Dist [place] 20/150 -2 20/40   Dist ph [place] NI 20/30 -2       Tonometry (Tonopen, 8:05 AM)      Right Left   Pressure 10 11       Pupils      Dark Light Shape React APD   Right 3 2 Round Brisk None   Left 3 2 Round Brisk None       Visual Fields (Counting fingers)      Left Right     Full       Extraocular Movement      Right Left    Full, Ortho Full, Ortho       Neuro/Psych    Oriented x3: Yes   Mood/Affect: Normal       Dilation    Both eyes: 1.0% Mydriacyl, 2.5% Phenylephrine @ 8:05 AM        Slit Lamp and Fundus Exam    External Exam      Right Left   External Normal        Slit Lamp Exam      Right Left   Lids/Lashes Dermatochalasis - upper lid, Meibomian gland dysfunction Dermatochalasis - upper lid, Meibomian gland dysfunction   Conjunctiva/Sclera White and quiet White and quiet   Cornea 1+ Punctate epithelial erosions, irregular tear film, Debris in tear  film Trace Punctate epithelial erosions   Anterior Chamber Deep and quiet Deep and quiet   Iris Round and moderately dilated Round and dilated   Lens 2-3+ Nuclear sclerosis with early brunescence, 2+ Cortical cataract, 2-3+ Posterior subcapsular cataract 2+ Nuclear sclerosis, 2+ Cortical cataract   Vitreous post vitrectomy, <5% gas fill Vitreous syneresis       Fundus Exam      Right Left   Disc hazy view, mild Pallor, Sharp rim Pink and Sharp   C/D Ratio 0.5 0.2   Macula Hazy view; flat; mac hole closed, Blunted foveal reflex, No heme or edema Flat, Good foveal reflex, No heme or edema   Vessels Vascular attenuation, mild Tortuousity Vascular attenuation, mild Tortuousity   Periphery hazy view, attached, good 360 laser changes Attached, no heme          IMAGING AND PROCEDURES  Imaging and Procedures for _0 @  OCT, Retina - OU - Both Eyes       Right Eye Quality was borderline. Central Foveal Thickness: 322. Progression has been stable. Findings include no SRF, abnormal foveal contour, no IRF (Mac hole closed; hyper reflective ellipsoid signal centrally).   Left Eye Quality was good. Central Foveal Thickness: 288. Progression has been stable. Findings include no SRF, abnormal foveal contour, vitreous traction, intraretinal fluid (Stable VMT with persistent cystic changes).   Notes *Images captured and stored on drive  Diagnosis / Impression:  OD: Mac hole closed; abnormal foveal profile; no IRF/SRF; hyper reflective ellipsoid signal centrally OS: stable VMT with persistent cystic changes  Clinical management:  See below  Abbreviations: NFP - Normal foveal profile. CME - cystoid macular edema. PED - pigment epithelial detachment. IRF - intraretinal fluid. SRF - subretinal fluid. EZ - ellipsoid zone. ERM - epiretinal membrane. ORA - outer retinal atrophy. ORT - outer retinal tubulation. SRHM - subretinal hyper-reflective material                  ASSESSMENT/PLAN:    ICD-10-CM   1. Macular hole of right eye  H35.341   2. Retinal edema  H35.81 OCT, Retina - OU - Both Eyes  3. Essential hypertension  I10   4. Hypertensive retinopathy of both eyes  H35.033   5. Combined forms of age-related cataract of both eyes  H25.813     1,2. Full thickness macular hole OD  - POW4 s/p PPV/TissueBlue stain/MP/14% C3F8 OD, 03.11.21             - doing well             - mac hole closed             - IOP 10  - <5% gas bubble             - dec PF decrease to BID x2 weeks, then Qdaily x2 weeks, then stop             - PSO ung PRN  - cont AT's QID OD             - avoid laying flat on back              - post op drop and positioning instructions reviewed   - f/u 4-6 weeks -- DFE/OCT  3,4. Hypertensive retinopathy OU  - discussed importance of tight BP control  - monitor  5. 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  - discussed diagnosis and progression  - discussed cataract progression post-vitrectomy -- +PSC OD  - clear from a retina standpoint to proceed with cataract eval in ~2 wks once gas bubble is gone  - pt requests referral to Dr. Midge Aver for cataract eval   Ophthalmic Meds Ordered this visit:  No orders of the defined types were placed in this encounter.      Return for f/u 4-6 weeks, FTMH OD, DFE, OCT.  There are no Patient Instructions on file for this visit.   Explained the diagnoses, plan, and follow up with the patient and they expressed understanding.  Patient expressed understanding of the importance of proper follow up care.   This document serves as a record of services personally performed by Gardiner Sleeper, MD, PhD. It was created on their behalf by Leeann Must, [place], a certified ophthalmic assistant. The creation of this record is the provider's dictation and/or activities during the visit.    Electronically  signed by: Leeann Must, COA _0 @ 8:38 AM   This document serves as a record of services personally performed by Gardiner Sleeper, MD, PhD. It was created on their behalf by Ernest Mallick, OA, an ophthalmic assistant. The creation of this record is the provider's dictation and/or activities during the visit.    Electronically signed by: Ernest Mallick, OA 05.06.2021 8:38 AM .  Gardiner Sleeper, M.D., Ph.D. Diseases & Surgery of the Retina and Vitreous [facility]  I have reviewed the above documentation for accuracy and completeness, and I agree with the above. Gardiner Sleeper, M.D., Ph.D. 06/15/19 8:38 AM   Abbreviations: M myopia (nearsighted); A astigmatism; H hyperopia (farsighted); P presbyopia; Mrx spectacle prescription;  CTL contact lenses; OD right eye; OS left eye; OU both eyes  XT exotropia; ET esotropia; PEK punctate epithelial keratitis; PEE punctate epithelial erosions; DES dry eye syndrome; MGD meibomian gland dysfunction; ATs artificial tears; PFAT's preservative free artificial tears; [place] nuclear sclerotic cataract; PSC posterior subcapsular cataract; ERM epi-retinal membrane; PVD posterior vitreous detachment; RD retinal detachment; DM diabetes mellitus; DR diabetic retinopathy; NPDR non-proliferative diabetic retinopathy; PDR proliferative diabetic retinopathy; CSME clinically significant macular edema; DME diabetic macular edema; dbh dot blot hemorrhages; CWS cotton wool spot; POAG primary open angle glaucoma; C/D cup-to-disc ratio; HVF humphrey visual field; GVF goldmann visual field; OCT optical coherence tomography; IOP intraocular pressure; BRVO Branch retinal vein occlusion; CRVO central retinal vein occlusion; CRAO central retinal artery occlusion; BRAO branch retinal artery occlusion; RT retinal tear; SB scleral buckle; PPV pars plana vitrectomy; VH Vitreous hemorrhage; PRP panretinal laser photocoagulation; IVK intravitreal kenalog; VMT vitreomacular  traction; MH Macular hole;  NVD neovascularization of the disc; NVE neovascularization elsewhere; AREDS age related eye disease study; ARMD age related macular degeneration; POAG primary open angle glaucoma; EBMD epithelial/anterior basement membrane dystrophy; ACIOL anterior chamber intraocular lens; IOL intraocular lens; PCIOL posterior chamber intraocular lens; Phaco/IOL phacoemulsification with intraocular lens placement; [place] photorefractive keratectomy; LASIK laser assisted in situ keratomileusis; HTN hypertension; DM diabetes mellitus; COPD chronic obstructive pulmonary disease

## 2019-06-15 ENCOUNTER — Other Ambulatory Visit: Payer: Self-pay

## 2019-06-15 ENCOUNTER — Ambulatory Visit (INDEPENDENT_AMBULATORY_CARE_PROVIDER_SITE_OTHER): Payer: Medicare HMO | Admitting: Ophthalmology

## 2019-06-15 ENCOUNTER — Encounter (INDEPENDENT_AMBULATORY_CARE_PROVIDER_SITE_OTHER): Payer: Self-pay | Admitting: Ophthalmology

## 2019-06-15 DIAGNOSIS — H35341 Macular cyst, hole, or pseudohole, right eye: Secondary | ICD-10-CM

## 2019-06-15 DIAGNOSIS — H3581 Retinal edema: Secondary | ICD-10-CM | POA: Diagnosis not present

## 2019-06-15 DIAGNOSIS — H25813 Combined forms of age-related cataract, bilateral: Secondary | ICD-10-CM

## 2019-06-15 DIAGNOSIS — I10 Essential (primary) hypertension: Secondary | ICD-10-CM

## 2019-06-15 DIAGNOSIS — H35033 Hypertensive retinopathy, bilateral: Secondary | ICD-10-CM

## 2019-06-29 ENCOUNTER — Other Ambulatory Visit (HOSPITAL_COMMUNITY): Payer: Self-pay | Admitting: Family Medicine

## 2019-06-29 DIAGNOSIS — Z1231 Encounter for screening mammogram for malignant neoplasm of breast: Secondary | ICD-10-CM

## 2019-07-04 ENCOUNTER — Encounter: Payer: Self-pay | Admitting: Obstetrics & Gynecology

## 2019-07-04 ENCOUNTER — Ambulatory Visit: Payer: Medicare HMO | Admitting: Obstetrics & Gynecology

## 2019-07-04 VITALS — Ht 66.0 in | Wt 204.0 lb

## 2019-07-04 DIAGNOSIS — Z466 Encounter for fitting and adjustment of urinary device: Secondary | ICD-10-CM | POA: Diagnosis not present

## 2019-07-04 DIAGNOSIS — Z4689 Encounter for fitting and adjustment of other specified devices: Secondary | ICD-10-CM

## 2019-07-04 DIAGNOSIS — N813 Complete uterovaginal prolapse: Secondary | ICD-10-CM

## 2019-07-04 DIAGNOSIS — B3731 Acute candidiasis of vulva and vagina: Secondary | ICD-10-CM

## 2019-07-04 MED ORDER — NYSTATIN-TRIAMCINOLONE 100000-0.1 UNIT/GM-% EX OINT: 1.0000 "application " | TOPICAL_OINTMENT | Freq: Two times a day (BID) | CUTANEOUS | 11 refills | Status: DC

## 2019-07-04 NOTE — Progress Notes (Signed)
Chief Complaint  Patient presents with  . Pessary Check    Height 5\' 6"  (1.676 m), weight 204 lb (92.5 kg).  Catherine Carney presents today for routine follow up related to her pessary.   She uses a Milex ring with support #6 She reports no vaginal discharge or vaginal bleeding.  Exam reveals no undue vaginal mucosal pressure of breakdown, no discharge and no vaginal bleeding.  The pessary is removed, cleaned and replaced without difficulty.   Had some vulvar yeast treated with gnetian violet   Catherine Carney will be sen back in 4 months for continued follow up.  Florian Buff, MD  07/04/2019 2:50 PM

## 2019-07-05 ENCOUNTER — Encounter: Payer: Self-pay | Admitting: *Deleted

## 2019-07-07 ENCOUNTER — Telehealth: Payer: Self-pay | Admitting: Obstetrics & Gynecology

## 2019-07-07 MED ORDER — NYSTATIN 100000 UNIT/GM EX CREA: 1.0000 "application " | TOPICAL_CREAM | Freq: Two times a day (BID) | CUTANEOUS | 11 refills | Status: DC

## 2019-07-07 NOTE — Telephone Encounter (Signed)
Patient states that the pharmacy contacted her to notify her that the cream that was sent in for her yeast infection is not covered by her insurance and would like to see if an alternative could be sent to pharmacy

## 2019-07-07 NOTE — Telephone Encounter (Signed)
Pt wanting to see if there is anything else Dr. Elonda Husky can send in. Insurance doesn't cover the cream that was sent in.

## 2019-07-12 NOTE — Progress Notes (Signed)
[facility] Note  07/17/2019     CHIEF COMPLAINT Patient presents for Retina Follow Up   HISTORY OF PRESENT ILLNESS: Catherine Carney is a 69 y.o. female who presents to the clinic today for:   HPI    Retina Follow Up    Patient presents with  Other (Macular hole).  In right eye.  Severity is moderate.  Duration of 5 weeks.  Since onset it is stable.  I, the attending physician,  performed the HPI with the patient and updated documentation appropriately.          Comments    Patient states vision the same OD. Off Pred Forte drops for the past 4 days OD--tapered down after surgery.        Last edited by Bernarda Caffey, MD on 07/17/2019  8:14 AM. (History)    pt states cataract surgery scheduled on July 8th w/ Dr. Shirleen Schirmer   Referring physician: Chesley Noon, [place],  [place] [address]  HISTORICAL INFORMATION:   Selected notes from the MEDICAL RECORD NUMBER Referred by Dr Radford Pax for retina eval   CURRENT MEDICATIONS: Current Outpatient Medications (Ophthalmic Drugs)  Medication Sig  . prednisoLONE acetate (PRED FORTE) 1 % ophthalmic suspension Place 1 drop into the right eye 3 (three) times daily. (Patient not taking: Reported on 07/17/2019)   No current facility-administered medications for this visit. (Ophthalmic Drugs)   Current Outpatient Medications (Other)  Medication Sig  . acetaminophen (TYLENOL) 500 MG tablet Take 500 mg by mouth every 6 (six) hours as needed (for pain.).  Marland Kitchen amLODipine (NORVASC) 10 MG tablet Take 10 mg by mouth daily.   Marland Kitchen aspirin EC 81 MG tablet Take 81 mg by mouth daily.  . Calcium Carb-Cholecalciferol (CALCIUM + D3 PO) Take 1 tablet by mouth daily.  . Coenzyme Q10 (COQ10) 100 MG CAPS Take 100 mg by mouth daily.  . fluconazole (DIFLUCAN) 150 MG tablet Take 1 tablet (150 mg total) by mouth every 3 (three) days.  Marland Kitchen lisinopril (ZESTRIL) 20 MG tablet Take 20 mg by mouth daily.  . Multiple Vitamin  (MULTIVITAMIN WITH MINERALS) TABS tablet Take 1 tablet by mouth daily.  Marland Kitchen nystatin cream (MYCOSTATIN) Apply 1 application topically 2 (two) times daily.  Marland Kitchen nystatin-triamcinolone ointment (MYCOLOG) Apply 1 application topically 2 (two) times daily.  Ernestine Conrad 3-6-9 Fatty Acids (TRIPLE OMEGA-3-6-9 PO) Take 1 capsule by mouth daily.  . rosuvastatin (CRESTOR) 40 MG tablet Take 20 mg by mouth at bedtime.   . vitamin B-12 (CYANOCOBALAMIN) 500 MCG tablet Take 500 mcg by mouth daily.   No current facility-administered medications for this visit. (Other)      REVIEW OF SYSTEMS: ROS    Positive for: Musculoskeletal, Eyes   Negative for: Constitutional, Gastrointestinal, Neurological, Skin, Genitourinary, HENT, Endocrine, Cardiovascular, Respiratory, Psychiatric, Allergic/Imm, Heme/Lymph   Last edited by Roselee Nova D, COT on 07/17/2019  7:49 AM. (History)       ALLERGIES No Known Allergies  PAST MEDICAL HISTORY Past Medical History:  Diagnosis Date  . Cataract    Combined form OU  . Family history of adverse reaction to anesthesia    sister had PONV  . Female bladder prolapse   . Hyperlipidemia   . Hypertension   . Hypertensive retinopathy    OU  . Macular hole of right eye   . Osteoarthritis    Past Surgical History:  Procedure Laterality Date  . [place]  PLANA VITRECTOMY WITH 20 GAUGE MVR PORT FOR MACULAR HOLE Right 04/20/2019   Procedure: 25 GAUGE PARS PLANA VITRECTOMY WITH 20 GAUGE MVR PORT FOR MACULAR HOLE WITH MEMBRANE PEEL;  Surgeon: Bernarda Caffey, MD;  Location: [facility];  Service: Ophthalmology;  Laterality: Right;  . CHOLECYSTECTOMY    . EYE SURGERY Right 04/20/2019   [facility] repair - PPV/MP - Dr. Bernarda Caffey  . GALLBLADDER SURGERY    . GAS/FLUID EXCHANGE Right 04/20/2019   Procedure: Gas/Fluid Exchange;  Surgeon: Bernarda Caffey, MD;  Location: [facility];  Service: Ophthalmology;  Laterality: Right;  . PHOTOCOAGULATION WITH LASER Right 04/20/2019   Procedure: Photocoagulation  With Laser;  Surgeon: Bernarda Caffey, MD;  Location: [facility];  Service: Ophthalmology;  Laterality: Right;  . TUBAL LIGATION      FAMILY HISTORY Family History  Problem Relation Age of Onset  . Heart attack Maternal Grandmother   . Cancer Maternal Grandfather   . Cancer Father        lung  . Other Mother        irregular heart beat  . Hypertension Mother   . Diabetes Brother   . Hypertension Brother   . High Cholesterol Brother   . Atrial fibrillation Brother   . Melanoma Brother        right ear  . Hypertension Sister   . High Cholesterol Sister   . Colon cancer Sister   . Rheum arthritis Sister   . Other Sister        irregular heart beat  . Hypertension Son   . Cancer Other        maternal side  . Stroke Other        maternal side    SOCIAL HISTORY Social History   Tobacco Use  . Smoking status: Never Smoker  . Smokeless tobacco: Never Used  Substance Use Topics  . Alcohol use: No    Alcohol/week: 0.0 standard drinks  . Drug use: No         OPHTHALMIC EXAM:  Base Eye Exam    Visual Acuity (Snellen - Linear)      Right Left   Dist [place] 20/150 20/40   Dist ph [place] NI 20/30 +2       Tonometry (Tonopen, 7:59 AM)      Right Left   Pressure 14 15       Neuro/Psych    Oriented x3: Yes   Mood/Affect: Normal       Dilation    Both eyes: 1.0% Mydriacyl, 2.5% Phenylephrine @ 7:59 AM        Slit Lamp and Fundus Exam    External Exam      Right Left   External Normal        Slit Lamp Exam      Right Left   Lids/Lashes Dermatochalasis - upper lid, Meibomian gland dysfunction Dermatochalasis - upper lid, Meibomian gland dysfunction   Conjunctiva/Sclera White and quiet White and quiet   Cornea 2-3+ Punctate epithelial erosions inferiorly, irregular tear film, Debris in tear film Trace Punctate epithelial erosions   Anterior Chamber Deep and quiet Deep and quiet   Iris Round and moderately dilated Round and dilated   Lens 2-3+ Nuclear sclerosis with  brunescence, 2+ Cortical cataract, 2-3+ Posterior subcapsular cataract 2+ Nuclear sclerosis, 2+ Cortical cataract   Vitreous post vitrectomy, gas bubble gone Vitreous syneresis       Fundus Exam      Right Left   Disc hazy  view, mild Pallor, Sharp rim Pink and Sharp   C/D Ratio 0.5 0.2   Macula Hazy view; flat; mac hole closed, Blunted foveal reflex, No heme or edema Flat, Good foveal reflex, No heme or edema   Vessels Vascular attenuation, mild Tortuousity Vascular attenuation, mild Tortuousity   Periphery attached, good 360 laser changes Attached, no heme        Refraction    Manifest Refraction      Sphere Cylinder Axis Dist [place]   Right -0.75 Sphere  NI   Left +0.50 +1.00 180 20/25+2          IMAGING AND PROCEDURES  Imaging and Procedures for _0 @           ASSESSMENT/PLAN:    ICD-10-CM   1. Macular hole of right eye  H35.341   2. Retinal edema  H35.81 OCT, Retina - OU - Both Eyes  3. Essential hypertension  I10   4. Hypertensive retinopathy of both eyes  H35.033   5. Combined forms of age-related cataract of both eyes  H25.813     1,2. Full thickness macular hole OD  - s/p PPV/TissueBlue stain/MP/14% C3F8 OD, 03.11.21             - doing well             - mac hole closed             - IOP 10  - gas bubble gone             - PF taper completed             - PSO ung PRN  - cont AT's QID OD  - f/u 8-10 weeks -- DFE/OCT  3,4. Hypertensive retinopathy OU  - discussed importance of tight BP control  - monitor  5. Mixed form age related cataract OU  - The symptoms of cataract, surgical options, and treatments and risks were discussed with patient.  - discussed diagnosis and progression  - discussed cataract progression post-vitrectomy -- +PSC OD  - clear from a retina standpoint to proceed with cataract surgery when pt and surgeon are ready  - cataract surgery scheduled on July 8th w/ Dr. Shirleen Schirmer   Ophthalmic Meds Ordered this visit:  No orders of  the defined types were placed in this encounter.      Return for 8-10 wks, Dilated Exam, OCT.  There are no Patient Instructions on file for this visit.   Explained the diagnoses, plan, and follow up with the patient and they expressed understanding.  Patient expressed understanding of the importance of proper follow up care.   This document serves as a record of services personally performed by Gardiner Sleeper, MD, PhD. It was created on their behalf by Ernest Mallick, OA, an ophthalmic assistant. The creation of this record is the provider's dictation and/or activities during the visit.    Electronically signed by: Ernest Mallick, OA 06.07.2021 8:28 AM  Gardiner Sleeper, M.D., Ph.D. Diseases & Surgery of the Retina and [place] 07/17/2019   I have reviewed the above documentation for accuracy and completeness, and I agree with the above. Gardiner Sleeper, M.D., Ph.D. 07/17/19 8:28 AM   Abbreviations: M myopia (nearsighted); A astigmatism; H hyperopia (farsighted); P presbyopia; Mrx spectacle prescription;  CTL contact lenses; OD right eye; OS left eye; OU both eyes  XT exotropia; ET esotropia; PEK punctate epithelial keratitis; PEE punctate epithelial erosions; DES  dry eye syndrome; MGD meibomian gland dysfunction; ATs artificial tears; PFAT's preservative free artificial tears; [place] nuclear sclerotic cataract; PSC posterior subcapsular cataract; ERM epi-retinal membrane; PVD posterior vitreous detachment; RD retinal detachment; DM diabetes mellitus; DR diabetic retinopathy; NPDR non-proliferative diabetic retinopathy; PDR proliferative diabetic retinopathy; CSME clinically significant macular edema; DME diabetic macular edema; dbh dot blot hemorrhages; CWS cotton wool spot; POAG primary open angle glaucoma; C/D cup-to-disc ratio; HVF humphrey visual field; GVF goldmann visual field; OCT optical coherence tomography; IOP intraocular pressure; BRVO Branch retinal  vein occlusion; CRVO central retinal vein occlusion; CRAO central retinal artery occlusion; BRAO branch retinal artery occlusion; RT retinal tear; SB scleral buckle; PPV pars plana vitrectomy; VH Vitreous hemorrhage; PRP panretinal laser photocoagulation; IVK intravitreal kenalog; VMT vitreomacular traction; MH Macular hole;  NVD neovascularization of the disc; NVE neovascularization elsewhere; AREDS age related eye disease study; ARMD age related macular degeneration; POAG primary open angle glaucoma; EBMD epithelial/anterior basement membrane dystrophy; ACIOL anterior chamber intraocular lens; IOL intraocular lens; PCIOL posterior chamber intraocular lens; Phaco/IOL phacoemulsification with intraocular lens placement; [place] photorefractive keratectomy; LASIK laser assisted in situ keratomileusis; HTN hypertension; DM diabetes mellitus; COPD chronic obstructive pulmonary disease

## 2019-07-17 ENCOUNTER — Encounter (INDEPENDENT_AMBULATORY_CARE_PROVIDER_SITE_OTHER): Payer: Self-pay | Admitting: Ophthalmology

## 2019-07-17 ENCOUNTER — Other Ambulatory Visit: Payer: Self-pay

## 2019-07-17 ENCOUNTER — Ambulatory Visit (INDEPENDENT_AMBULATORY_CARE_PROVIDER_SITE_OTHER): Payer: Medicare HMO | Admitting: Ophthalmology

## 2019-07-17 DIAGNOSIS — H3581 Retinal edema: Secondary | ICD-10-CM

## 2019-07-17 DIAGNOSIS — H35341 Macular cyst, hole, or pseudohole, right eye: Secondary | ICD-10-CM

## 2019-07-17 DIAGNOSIS — H35033 Hypertensive retinopathy, bilateral: Secondary | ICD-10-CM

## 2019-07-17 DIAGNOSIS — H25813 Combined forms of age-related cataract, bilateral: Secondary | ICD-10-CM

## 2019-07-17 DIAGNOSIS — I10 Essential (primary) hypertension: Secondary | ICD-10-CM

## 2019-08-02 ENCOUNTER — Ambulatory Visit (HOSPITAL_COMMUNITY)
Admission: RE | Admit: 2019-08-02 | Discharge: 2019-08-02 | Disposition: A | Discharge status: Home / Self Care | Payer: Medicare HMO | Source: Ambulatory Visit | Attending: Family Medicine | Admitting: Family Medicine

## 2019-08-02 ENCOUNTER — Other Ambulatory Visit: Payer: Self-pay

## 2019-08-02 DIAGNOSIS — Z1231 Encounter for screening mammogram for malignant neoplasm of breast: Secondary | ICD-10-CM | POA: Insufficient documentation

## 2019-09-05 NOTE — Progress Notes (Signed)
[facility] Note  09/11/2019     CHIEF COMPLAINT Patient presents for Retina Follow Up   HISTORY OF PRESENT ILLNESS: Catherine Carney is a 69 y.o. female who presents to the clinic today for:   HPI    Retina Follow Up    Patient presents with  Other.  In right eye.  This started 8 weeks ago.  Severity is moderate.  I, the attending physician,  performed the HPI with the patient and updated documentation appropriately.          Comments    Patient here for 8 weeks retina follow up for mac hole OD. Patient states vision better since cat surgery OD. Sees Dr Katy Fitch next week to have laser done for film. No eye pain.       Last edited by Bernarda Caffey, MD on 09/11/2019  8:08 AM. (History)    pt had cataract sx OD on July 8 with Dr. Shirleen Schirmer, she goes next week to have YAG procedure done, she states she still see distortion OD as well, they have no plans for OS sx as of now   Referring physician: Chesley Noon, MD [place],  [place] [address]  HISTORICAL INFORMATION:   Selected notes from the MEDICAL RECORD NUMBER Referred by Dr Radford Pax for retina eval   CURRENT MEDICATIONS: Current Outpatient Medications (Ophthalmic Drugs)  Medication Sig  . prednisoLONE acetate (PRED FORTE) 1 % ophthalmic suspension Place 1 drop into the right eye 3 (three) times daily. (Patient not taking: Reported on 07/17/2019)   No current facility-administered medications for this visit. (Ophthalmic Drugs)   Current Outpatient Medications (Other)  Medication Sig  . acetaminophen (TYLENOL) 500 MG tablet Take 500 mg by mouth every 6 (six) hours as needed (for pain.).  Marland Kitchen amLODipine (NORVASC) 10 MG tablet Take 10 mg by mouth daily.   Marland Kitchen aspirin EC 81 MG tablet Take 81 mg by mouth daily.  . Calcium Carb-Cholecalciferol (CALCIUM + D3 PO) Take 1 tablet by mouth daily.  . Coenzyme Q10 (COQ10) 100 MG CAPS Take 100 mg by mouth daily.  . fluconazole (DIFLUCAN) 150 MG tablet  Take 1 tablet (150 mg total) by mouth every 3 (three) days.  Marland Kitchen lisinopril (ZESTRIL) 20 MG tablet Take 20 mg by mouth daily.  . Multiple Vitamin (MULTIVITAMIN WITH MINERALS) TABS tablet Take 1 tablet by mouth daily.  Marland Kitchen nystatin cream (MYCOSTATIN) Apply 1 application topically 2 (two) times daily.  Marland Kitchen nystatin-triamcinolone ointment (MYCOLOG) Apply 1 application topically 2 (two) times daily.  Ernestine Conrad 3-6-9 Fatty Acids (TRIPLE OMEGA-3-6-9 PO) Take 1 capsule by mouth daily.  . rosuvastatin (CRESTOR) 40 MG tablet Take 20 mg by mouth at bedtime.   . vitamin B-12 (CYANOCOBALAMIN) 500 MCG tablet Take 500 mcg by mouth daily.   No current facility-administered medications for this visit. (Other)      REVIEW OF SYSTEMS: ROS    Positive for: Musculoskeletal, Eyes   Negative for: Constitutional, Gastrointestinal, Neurological, Skin, Genitourinary, HENT, Endocrine, Cardiovascular, Respiratory, Psychiatric, Allergic/Imm, Heme/Lymph   Last edited by Theodore Demark, COA on 09/11/2019  7:57 AM. (History)       ALLERGIES No Known Allergies  PAST MEDICAL HISTORY Past Medical History:  Diagnosis Date  . Cataract    Combined form OU  . Family history of adverse reaction to anesthesia    sister had PONV  . Female bladder prolapse   . Hyperlipidemia   . Hypertension   .  Hypertensive retinopathy    OU  . Macular hole of right eye   . Osteoarthritis    Past Surgical History:  Procedure Laterality Date  . [place] VITRECTOMY WITH 20 GAUGE MVR PORT FOR MACULAR HOLE Right 04/20/2019   Procedure: 25 GAUGE PARS PLANA VITRECTOMY WITH 20 GAUGE MVR PORT FOR MACULAR HOLE WITH MEMBRANE PEEL;  Surgeon: Bernarda Caffey, MD;  Location: [facility];  Service: Ophthalmology;  Laterality: Right;  . CHOLECYSTECTOMY    . EYE SURGERY Right 04/20/2019   [facility] repair - PPV/MP - Dr. Bernarda Caffey  . GALLBLADDER SURGERY    . GAS/FLUID EXCHANGE Right 04/20/2019   Procedure: Gas/Fluid Exchange;  Surgeon: Bernarda Caffey, MD;  Location: [facility];  Service: Ophthalmology;  Laterality: Right;  . PHOTOCOAGULATION WITH LASER Right 04/20/2019   Procedure: Photocoagulation With Laser;  Surgeon: Bernarda Caffey, MD;  Location: [facility];  Service: Ophthalmology;  Laterality: Right;  . TUBAL LIGATION      FAMILY HISTORY Family History  Problem Relation Age of Onset  . Heart attack Maternal Grandmother   . Cancer Maternal Grandfather   . Cancer Father        lung  . Other Mother        irregular heart beat  . Hypertension Mother   . Diabetes Brother   . Hypertension Brother   . High Cholesterol Brother   . Atrial fibrillation Brother   . Melanoma Brother        right ear  . Hypertension Sister   . High Cholesterol Sister   . Colon cancer Sister   . Rheum arthritis Sister   . Other Sister        irregular heart beat  . Hypertension Son   . Cancer Other        maternal side  . Stroke Other        maternal side    SOCIAL HISTORY Social History   Tobacco Use  . Smoking status: Never Smoker  . Smokeless tobacco: Never Used  Vaping Use  . Vaping Use: Never used  Substance Use Topics  . Alcohol use: No    Alcohol/week: 0.0 standard drinks  . Drug use: No         OPHTHALMIC EXAM:  Base Eye Exam    Visual Acuity (Snellen - Linear)      Right Left   Dist [place] 20/25 +1 20/50 -2   Dist ph [place] NI 20/30 +1       Tonometry (Tonopen, 7:54 AM)      Right Left   Pressure 17 16       Pupils      Dark Light Shape React APD   Right 3 2 Round Brisk None   Left 3 2 Round Brisk None       Visual Fields (Counting fingers)      Left Right    Full Full       Extraocular Movement      Right Left    Full, Ortho Full, Ortho       Neuro/Psych    Oriented x3: Yes   Mood/Affect: Normal       Dilation    Both eyes: 1.0% Mydriacyl, 2.5% Phenylephrine @ 7:54 AM        Slit Lamp and Fundus Exam    External Exam      Right Left   External Normal        Slit Lamp Exam  Right Left    Lids/Lashes Dermatochalasis - upper lid, Meibomian gland dysfunction Dermatochalasis - upper lid, Meibomian gland dysfunction   Conjunctiva/Sclera White and quiet White and quiet   Cornea 1-2+ inferior Punctate epithelial erosions inferiorly 2+Punctate epithelial erosions, Debris in tear film   Anterior Chamber Deep and quiet Deep and quiet   Iris Round and moderately dilated Round and dilated   Lens PC IOL in good position, 1+ Posterior capsular opacification 2+ Nuclear sclerosis, 2+ Cortical cataract   Vitreous post vitrectomy Vitreous syneresis       Fundus Exam      Right Left   Disc mild Pallor, Sharp rim, mild PPA Pink and Sharp, mild PPA   C/D Ratio 0.5 0.2   Macula flat; mac hole closed, Blunted foveal reflex, mild RPE mottling, No heme or edema Flat, blunted foveal reflex, mild RPE mottling, No heme or edema   Vessels Vascular attenuation, mild Tortuousity Vascular attenuation, mild Tortuousity   Periphery attached, good 360 laser changes Attached, no heme          IMAGING AND PROCEDURES  Imaging and Procedures for _0 @  OCT, Retina - OU - Both Eyes       Right Eye Quality was good. Central Foveal Thickness: 320. Progression has been stable. Findings include no SRF, abnormal foveal contour, no IRF (Mac hole closed).   Left Eye Quality was good. Central Foveal Thickness: 291. Progression has been stable. Findings include no SRF, abnormal foveal contour, vitreous traction, intraretinal fluid (Stable VMT with persistent cystic changes).   Notes *Images captured and stored on drive  Diagnosis / Impression:  OD: Mac hole closed; abnormal foveal profile; no IRF/SRF OS: stable VMT with persistent cystic changes  Clinical management:  See below  Abbreviations: NFP - Normal foveal profile. CME - cystoid macular edema. PED - pigment epithelial detachment. IRF - intraretinal fluid. SRF - subretinal fluid. EZ - ellipsoid zone. ERM - epiretinal membrane. ORA - outer retinal  atrophy. ORT - outer retinal tubulation. SRHM - subretinal hyper-reflective material                 ASSESSMENT/PLAN:    ICD-10-CM   1. Macular hole of right eye  H35.341   2. Retinal edema  H35.81 OCT, Retina - OU - Both Eyes  3. Essential hypertension  I10   4. Hypertensive retinopathy of both eyes  H35.033   5. Combined forms of age-related cataract of left eye  H25.812   6. Pseudophakia  Z96.1     1,2. Full thickness macular hole OD  - s/p PPV/TissueBlue stain/MP/14% C3F8 OD, 03.11.21             - doing well             - mac hole closed  - BCVA 20/25 OD post cataract surgery  - f/u 6 mos  3,4. Hypertensive retinopathy OU  - discussed importance of tight BP control  - monitor  5. 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  - discussed diagnosis and progression  - under the expert management of Dr. Shirleen Schirmer   6. Pseudophakia OD  - s/p CE/IOL OD (Dr. Shirleen Schirmer, 07.08.21)  - IOL in excellent position, doing well  - scheduled for YAG next week  - monitor    Ophthalmic Meds Ordered this visit:  No orders of the defined types were placed in this encounter.  Return in about 6 months (around 03/13/2020) for f/u mac hole OD - Dilated Exam, OCT.  There are no Patient Instructions on file for this visit.   Explained the diagnoses, plan, and follow up with the patient and they expressed understanding.  Patient expressed understanding of the importance of proper follow up care.   This document serves as a record of services personally performed by Gardiner Sleeper, MD, PhD. It was created on their behalf by Leonie Douglas, an ophthalmic technician. The creation of this record is the provider's dictation and/or activities during the visit.    Electronically signed by: Leonie Douglas COA, 09/11/19  9:05 AM   This document serves as a record of services personally performed by  Gardiner Sleeper, MD, PhD. It was created on their behalf by San Jetty. Owens Shark, OA an ophthalmic technician. The creation of this record is the provider's dictation and/or activities during the visit.    Electronically signed by: San Jetty. Owens Shark, [place] 08.02.2021 9:05 AM   Gardiner Sleeper, M.D., Ph.D. Diseases & Surgery of the Retina and Vitreous [facility]  I have reviewed the above documentation for accuracy and completeness, and I agree with the above. Gardiner Sleeper, M.D., Ph.D. 09/11/19 9:05 AM   Abbreviations: M myopia (nearsighted); A astigmatism; H hyperopia (farsighted); P presbyopia; Mrx spectacle prescription;  CTL contact lenses; OD right eye; OS left eye; OU both eyes  XT exotropia; ET esotropia; PEK punctate epithelial keratitis; PEE punctate epithelial erosions; DES dry eye syndrome; MGD meibomian gland dysfunction; ATs artificial tears; PFAT's preservative free artificial tears; [place] nuclear sclerotic cataract; PSC posterior subcapsular cataract; ERM epi-retinal membrane; PVD posterior vitreous detachment; RD retinal detachment; DM diabetes mellitus; DR diabetic retinopathy; NPDR non-proliferative diabetic retinopathy; PDR proliferative diabetic retinopathy; CSME clinically significant macular edema; DME diabetic macular edema; dbh dot blot hemorrhages; CWS cotton wool spot; POAG primary open angle glaucoma; C/D cup-to-disc ratio; HVF humphrey visual field; GVF goldmann visual field; OCT optical coherence tomography; IOP intraocular pressure; BRVO Branch retinal vein occlusion; CRVO central retinal vein occlusion; CRAO central retinal artery occlusion; BRAO branch retinal artery occlusion; RT retinal tear; SB scleral buckle; PPV pars plana vitrectomy; VH Vitreous hemorrhage; PRP panretinal laser photocoagulation; IVK intravitreal kenalog; VMT vitreomacular traction; MH Macular hole;  NVD neovascularization of the disc; NVE neovascularization elsewhere; AREDS age related  eye disease study; ARMD age related macular degeneration; POAG primary open angle glaucoma; EBMD epithelial/anterior basement membrane dystrophy; ACIOL anterior chamber intraocular lens; IOL intraocular lens; PCIOL posterior chamber intraocular lens; Phaco/IOL phacoemulsification with intraocular lens placement; [place] photorefractive keratectomy; LASIK laser assisted in situ keratomileusis; HTN hypertension; DM diabetes mellitus; COPD chronic obstructive pulmonary disease

## 2019-09-11 ENCOUNTER — Encounter (INDEPENDENT_AMBULATORY_CARE_PROVIDER_SITE_OTHER): Payer: Self-pay | Admitting: Ophthalmology

## 2019-09-11 ENCOUNTER — Ambulatory Visit (INDEPENDENT_AMBULATORY_CARE_PROVIDER_SITE_OTHER): Payer: Medicare HMO | Admitting: Ophthalmology

## 2019-09-11 ENCOUNTER — Other Ambulatory Visit: Payer: Self-pay

## 2019-09-11 DIAGNOSIS — H35033 Hypertensive retinopathy, bilateral: Secondary | ICD-10-CM | POA: Diagnosis not present

## 2019-09-11 DIAGNOSIS — H3581 Retinal edema: Secondary | ICD-10-CM

## 2019-09-11 DIAGNOSIS — H35341 Macular cyst, hole, or pseudohole, right eye: Secondary | ICD-10-CM | POA: Diagnosis not present

## 2019-09-11 DIAGNOSIS — Z961 Presence of intraocular lens: Secondary | ICD-10-CM

## 2019-09-11 DIAGNOSIS — H25812 Combined forms of age-related cataract, left eye: Secondary | ICD-10-CM

## 2019-09-11 DIAGNOSIS — I10 Essential (primary) hypertension: Secondary | ICD-10-CM | POA: Diagnosis not present

## 2019-09-13 ENCOUNTER — Encounter (INDEPENDENT_AMBULATORY_CARE_PROVIDER_SITE_OTHER): Payer: Medicare HMO | Admitting: Ophthalmology

## 2019-09-19 ENCOUNTER — Ambulatory Visit: Payer: Medicare HMO

## 2019-10-24 ENCOUNTER — Other Ambulatory Visit: Payer: Self-pay

## 2019-10-24 ENCOUNTER — Ambulatory Visit (INDEPENDENT_AMBULATORY_CARE_PROVIDER_SITE_OTHER): Payer: Self-pay | Admitting: *Deleted

## 2019-10-24 DIAGNOSIS — Z1211 Encounter for screening for malignant neoplasm of colon: Secondary | ICD-10-CM

## 2019-10-24 MED ORDER — NA SULFATE-K SULFATE-MG SULF 17.5-3.13-1.6 GM/177ML PO SOLN: 1.0000 | Freq: Once | ORAL | 0 refills | Status: AC

## 2019-10-24 NOTE — Patient Instructions (Signed)
Catherine Carney  Jun 06, 1950 MRN: [id number]     Procedure Date: 11/17/2019 Time to register: 9:45 am Place to register: [facility] Procedure Time: 11:15 am Scheduled provider: Dr. Abbey Chatters    PREPARATION FOR COLONOSCOPY WITH SUPREP BOWEL PREP KIT  Note: Suprep Bowel Prep Kit is a split-dose (2day) regimen. Consumption of BOTH 6-ounce bottles is required for a complete prep.  Please notify us immediately if you are diabetic, take iron supplements, or if you are on Coumadin or any other blood thinners.  Please hold the following medications: n/a                                                                                                                                                  2 DAYS BEFORE PROCEDURE:  DATE: 11/15/2019   DAY: Wednesday Begin clear liquid diet AFTER your lunch meal. NO SOLID FOODS after this point.  1 DAY BEFORE PROCEDURE:  DATE: 11/16/2019   DAY: Thursday Continue clear liquids the entire day - NO SOLID FOOD.   Diabetic medications adjustments for today: n/a  At 6:00pm: Complete steps 1 through 4 below, using ONE (1) 6-ounce bottle, before going to bed. Step 1:  Pour ONE (1) 6-ounce bottle of SUPREP liquid into the mixing container.  Step 2:  Add cool drinking water to the 16 ounce line on the container and mix.  Note: Dilute the solution concentrate as directed prior to use. Step 3:  DRINK ALL the liquid in the container. Step 4:  You MUST drink an additional two (2) or more 16 ounce containers of water over the next one (1) hour.   Continue clear liquids.  DAY OF PROCEDURE:   DATE: 11/17/2019   DAY: Friday If you take medications for your heart, blood pressure, or breathing, you may take these medications.  Diabetic medications adjustments for today: n/a  5 hours before your procedure at 6:15 am : Step 1:  Pour ONE (1) 6-ounce bottle of SUPREP liquid into the mixing container.  Step 2:  Add cool drinking water to the 16 ounce line on the  container and mix.  Note: Dilute the solution concentrate as directed prior to use. Step 3:  DRINK ALL the liquid in the container. Step 4:  You MUST drink an additional two (2) or more 16 ounce containers of water over the next one (1) hour. You MUST complete the final glass of water at least 3 hours before your colonoscopy. Nothing by mouth past 8:15 am.   You may take your morning medications with sip of water unless we have instructed otherwise.    Please see below for Dietary Information.  CLEAR LIQUIDS INCLUDE:  Water Jello (NOT red in color)   Ice Popsicles (NOT red in color)   Tea (sugar ok, no milk/cream) Powdered fruit flavored drinks  Coffee (sugar  ok, no milk/cream) Gatorade/ Lemonade/ Kool-Aid  (NOT red in color)   Juice: apple, white grape, white cranberry Soft drinks  Clear bullion, consomme, broth (fat free beef/chicken/vegetable)  Carbonated beverages (any kind)  Strained chicken noodle soup Hard Candy   Remember: Clear liquids are liquids that will allow you to see your fingers on the other side of a clear glass. Be sure liquids are NOT red in color, and not cloudy, but CLEAR.  DO NOT EAT OR DRINK ANY OF THE FOLLOWING:  Dairy products of any kind   Cranberry juice Tomato juice / V8 juice   Grapefruit juice Orange juice     Red grape juice  Do not eat any solid foods, including such foods as: cereal, oatmeal, yogurt, fruits, vegetables, creamed soups, eggs, bread, crackers, pureed foods in a blender, etc.   HELPFUL HINTS FOR DRINKING PREP SOLUTION:   Make sure prep is extremely cold. Mix and refrigerate the the morning of the prep. You may also put in the freezer.   You may try mixing some Crystal Light or Country Time Lemonade if you prefer. Mix in small amounts; add more if necessary.  Try drinking through a straw  Rinse mouth with water or a mouthwash between glasses, to remove after-taste.  Try sipping on a cold beverage /ice/ popsicles between glasses of  prep.  Place a piece of sugar-free hard candy in mouth between glasses.  If you become nauseated, try consuming smaller amounts, or stretch out the time between glasses. Stop for 30-60 minutes, then slowly start back drinking.     OTHER INSTRUCTIONS  You will need a responsible adult at least 71 years of age to accompany you and drive you home. This person must remain in the waiting room during your procedure. The hospital will cancel your procedure if you do not have a responsible adult with you.   1. Wear loose fitting clothing that is easily removed. 2. Leave jewelry and other valuables at home.  3. Remove all body piercing jewelry and leave at home. 4. Total time from sign-in until discharge is approximately 2-3 hours. 5. You should go home directly after your procedure and rest. You can resume normal activities the day after your procedure. 6. The day of your procedure you should not:  Drive  Make legal decisions  Operate machinery  Drink alcohol  Return to work   You may call the office (Dept: [phone]) before 5:00pm, or page the doctor on call [phone]) after 5:00pm, for further instructions, if necessary.   Insurance Information YOU WILL NEED TO CHECK WITH YOUR INSURANCE COMPANY FOR THE BENEFITS OF COVERAGE YOU HAVE FOR THIS PROCEDURE.  UNFORTUNATELY, NOT ALL INSURANCE COMPANIES HAVE BENEFITS TO COVER ALL OR PART OF THESE TYPES OF PROCEDURES.  IT IS YOUR RESPONSIBILITY TO CHECK YOUR BENEFITS, HOWEVER, WE WILL BE GLAD TO ASSIST YOU WITH ANY CODES YOUR INSURANCE COMPANY MAY NEED.    PLEASE NOTE THAT MOST INSURANCE COMPANIES WILL NOT COVER A SCREENING COLONOSCOPY FOR PEOPLE UNDER THE AGE OF 50  IF YOU HAVE BCBS INSURANCE, YOU MAY HAVE BENEFITS FOR A SCREENING COLONOSCOPY BUT IF POLYPS ARE FOUND THE DIAGNOSIS WILL CHANGE AND THEN YOU MAY HAVE A DEDUCTIBLE THAT WILL NEED TO BE MET. SO PLEASE MAKE SURE YOU CHECK YOUR BENEFITS FOR A SCREENING COLONOSCOPY AS WELL AS A  DIAGNOSTIC COLONOSCOPY.

## 2019-10-24 NOTE — Progress Notes (Addendum)
Gastroenterology Pre-Procedure Review  Request Date: 10/24/2019 Requesting Physician: Dr. Anastasia Pall, Last TCS done 10/10/2013 by Dr. Anthony Sar, no polyps, family hx of colon cancer (sister, aunts, uncle)  PATIENT REVIEW QUESTIONS: The patient responded to the following health history questions as indicated:    1. Diabetes Melitis: no 2. Joint replacements in the past 12 months: no 3. Major health problems in the past 3 months: yes, Covid last month 4. Has an artificial valve or MVP: no 5. Has a defibrillator: no 6. Has been advised in past to take antibiotics in advance of a procedure like teeth cleaning: no 7. Family history of colon cancer: yes, sister: age 68, 2 aunts: age 18's, uncle age: 8's 8. Alcohol Use: no 9. Illicit drug Use: no 10. History of sleep apnea:  no 11. History of coronary artery or other vascular stents placed within the last 12 months: no 12. History of any prior anesthesia complications: no 13. There is no height or weight on file to calculate BMI. ht: 5'6 wt: 200 lbs    MEDICATIONS & ALLERGIES:    Patient reports the following regarding taking any blood thinners:   Plavix? no Aspirin? yes Coumadin? no Brilinta? no Xarelto? no Eliquis? no Pradaxa? no Savaysa? no Effient? no  Patient confirms/reports the following medications:  Current Outpatient Medications  Medication Sig Dispense Refill  . acetaminophen (TYLENOL) 500 MG tablet Take 500 mg by mouth as needed (for pain.).     Marland Kitchen amLODipine (NORVASC) 10 MG tablet Take 10 mg by mouth daily.     Marland Kitchen aspirin EC 81 MG tablet Take 81 mg by mouth daily.    . Calcium Carb-Cholecalciferol (CALCIUM + D3 PO) Take 1 tablet by mouth daily.    . Coenzyme Q10 (COQ10) 100 MG CAPS Take 100 mg by mouth daily.    Marland Kitchen lisinopril (ZESTRIL) 20 MG tablet Take 20 mg by mouth daily.    . Multiple Vitamin (MULTIVITAMIN WITH MINERALS) TABS tablet Take 1 tablet by mouth daily.    Marland Kitchen nystatin cream (MYCOSTATIN) Apply 1 application  topically 2 (two) times daily. 30 g 11  . Omega 3-6-9 Fatty Acids (TRIPLE OMEGA-3-6-9 PO) Take 1 capsule by mouth daily.    . rosuvastatin (CRESTOR) 40 MG tablet Take 20 mg by mouth at bedtime.     . vitamin B-12 (CYANOCOBALAMIN) 500 MCG tablet Take 500 mcg by mouth daily.    . fluconazole (DIFLUCAN) 150 MG tablet Take 1 tablet (150 mg total) by mouth every 3 (three) days. (Patient not taking: Reported on 10/24/2019) 2 tablet 1  . nystatin-triamcinolone ointment (MYCOLOG) Apply 1 application topically 2 (two) times daily. (Patient not taking: Reported on 10/24/2019) 30 g 11  . prednisoLONE acetate (PRED FORTE) 1 % ophthalmic suspension Place 1 drop into the right eye 3 (three) times daily. (Patient not taking: Reported on 07/17/2019) 15 mL 0   No current facility-administered medications for this visit.    Patient confirms/reports the following allergies:  No Known Allergies  No orders of the defined types were placed in this encounter.   AUTHORIZATION INFORMATION Primary Insurance: [employer] Medicare,  ID #:[id number] ,  Group #: [id number] Pre-Cert / Josem Kaufmann required: No, not required  SCHEDULE INFORMATION: Procedure has been scheduled as follows:  Date: 11/17/2019, Time: 11:15 Location: [facility] with Dr. Abbey Chatters  This Gastroenterology Pre-Precedure Review Form is being routed to the following provider(s): Walden Field, NP

## 2019-11-03 ENCOUNTER — Ambulatory Visit: Payer: Medicare HMO | Admitting: Obstetrics & Gynecology

## 2019-11-03 NOTE — Progress Notes (Signed)
Ok to schedule.  ASA II 

## 2019-11-07 ENCOUNTER — Encounter: Payer: Self-pay | Admitting: Obstetrics & Gynecology

## 2019-11-07 ENCOUNTER — Other Ambulatory Visit: Payer: Self-pay

## 2019-11-07 ENCOUNTER — Ambulatory Visit: Payer: Medicare HMO | Admitting: Obstetrics & Gynecology

## 2019-11-07 VITALS — BP 139/79 | HR 73 | Ht 66.0 in | Wt 201.0 lb

## 2019-11-07 DIAGNOSIS — Z4689 Encounter for fitting and adjustment of other specified devices: Secondary | ICD-10-CM | POA: Diagnosis not present

## 2019-11-07 DIAGNOSIS — N813 Complete uterovaginal prolapse: Secondary | ICD-10-CM

## 2019-11-07 NOTE — Progress Notes (Signed)
Chief Complaint  Patient presents with  . Pessary Check    Blood pressure 139/79, pulse 73, height 5\' 6"  (1.676 m), weight 201 lb (91.2 kg).  Catherine Carney presents today for routine follow up related to her pessary.   She uses a Milex ring with support #6 She reports no vaginal discharge or vaginal bleeding.  Exam reveals no undue vaginal mucosal pressure of breakdown, no discharge and no vaginal bleeding.  The pessary is removed, cleaned and replaced without difficulty.    BYRON TIPPING will be sen back in 4 months for continued follow up.  Florian Buff, MD  11/07/2019 11:33 AM

## 2019-11-09 ENCOUNTER — Telehealth: Payer: Self-pay | Admitting: *Deleted

## 2019-11-09 NOTE — Telephone Encounter (Signed)
Spoke with pt and informed her that they were mailed out on 11/07/2019 so that's probably why she hadn't received them yet.  Offered to mail out a second set but pt wanted to wait a few more days.  She will call me if she hasn't received them in a couple of days.

## 2019-11-09 NOTE — Telephone Encounter (Signed)
Pt is scheduled procedure next Friday and hasn't received her prep instructions. I couldn't find them in epic. Can you check behind me and mail her a copy? I verified her address and it's correct.

## 2019-11-15 ENCOUNTER — Other Ambulatory Visit: Payer: Self-pay | Admitting: *Deleted

## 2019-11-15 ENCOUNTER — Other Ambulatory Visit: Payer: Self-pay

## 2019-11-15 ENCOUNTER — Other Ambulatory Visit (HOSPITAL_COMMUNITY)
Admission: RE | Admit: 2019-11-15 | Discharge: 2019-11-15 | Disposition: A | Discharge status: Home / Self Care | Payer: Medicare HMO | Source: Ambulatory Visit | Attending: Family Medicine | Admitting: Family Medicine

## 2019-11-15 ENCOUNTER — Ambulatory Visit: Payer: Medicare HMO | Admitting: Internal Medicine

## 2019-11-15 ENCOUNTER — Encounter: Payer: Self-pay | Admitting: Internal Medicine

## 2019-11-15 DIAGNOSIS — Z800 Family history of malignant neoplasm of digestive organs: Secondary | ICD-10-CM | POA: Diagnosis not present

## 2019-11-15 DIAGNOSIS — K30 Functional dyspepsia: Secondary | ICD-10-CM

## 2019-11-15 DIAGNOSIS — R1319 Other dysphagia: Secondary | ICD-10-CM

## 2019-11-15 DIAGNOSIS — Z01818 Encounter for other preprocedural examination: Secondary | ICD-10-CM | POA: Diagnosis present

## 2019-11-15 DIAGNOSIS — U071 COVID-19: Secondary | ICD-10-CM | POA: Diagnosis not present

## 2019-11-15 DIAGNOSIS — R1310 Dysphagia, unspecified: Secondary | ICD-10-CM | POA: Insufficient documentation

## 2019-11-15 LAB — SARS CORONAVIRUS 2 (TAT 6-24 HRS): SARS Coronavirus 2: POSITIVE — AB

## 2019-11-15 NOTE — Progress Notes (Signed)
Primary Care Physician:  Chesley Noon, MD Primary Gastroenterologist:  Dr. Abbey Chatters  Chief Complaint  Patient presents with  . Dysphagia    difficulty swallowing solid foods. went to pcp yesterday he recommended egd  has colonoscopy on 11/17/19    HPI:   Catherine Carney is a 69 y.o. female who presents to the clinic today by referral from her PCP Dr. Melford Aase for evaluation.  She says she is scheduled to undergo colonoscopy due to family history of colon cancer in a few days.  She states that she is interested in having an EGD performed at the same time.  Notes progressively worsening dysphagia over the last year.  States that more recently over the last few weeks she has noted that foods will "get stuck" in her substernal region.  States is mainly with solids, no issues with liquids.  No chronic NSAID use.  No history of PUD.  No melena hematochezia.  No previous upper endoscopy.  Does note occasional reflux as well which she describes as indigestion.  Last colonoscopy was in 2015 largely unremarkable but she has more than 3 relatives with history of colon cancer.  Past Medical History:  Diagnosis Date  . Cataract    Combined form OU  . Family history of adverse reaction to anesthesia    sister had PONV  . Female bladder prolapse   . Hyperlipidemia   . Hypertension   . Hypertensive retinopathy    OU  . Macular hole of right eye   . Osteoarthritis     Past Surgical History:  Procedure Laterality Date  . [place] VITRECTOMY WITH 20 GAUGE MVR PORT FOR MACULAR HOLE Right 04/20/2019   Procedure: 25 GAUGE PARS PLANA VITRECTOMY WITH 20 GAUGE MVR PORT FOR MACULAR HOLE WITH MEMBRANE PEEL;  Surgeon: Bernarda Caffey, MD;  Location: [place];  Service: Ophthalmology;  Laterality: Right;  . CHOLECYSTECTOMY    . EYE SURGERY Right 04/20/2019   [facility] repair - PPV/MP - Dr. Bernarda Caffey  . GALLBLADDER SURGERY    . GAS/FLUID EXCHANGE Right 04/20/2019   Procedure: Gas/Fluid Exchange;   Surgeon: Bernarda Caffey, MD;  Location: [place];  Service: Ophthalmology;  Laterality: Right;  . PHOTOCOAGULATION WITH LASER Right 04/20/2019   Procedure: Photocoagulation With Laser;  Surgeon: Bernarda Caffey, MD;  Location: [place];  Service: Ophthalmology;  Laterality: Right;  . TUBAL LIGATION      Current Outpatient Medications  Medication Sig Dispense Refill  . acetaminophen (TYLENOL) 500 MG tablet Take 500 mg by mouth every 6 (six) hours as needed (for pain.).     Marland Kitchen aspirin EC 81 MG tablet Take 81 mg by mouth daily.    . Calcium Carb-Cholecalciferol (CALCIUM 600+D) 600-800 MG-UNIT TABS Take 1 tablet by mouth daily.    . Coenzyme Q10 (COQ10) 100 MG CAPS Take 100 mg by mouth daily.    Marland Kitchen lisinopril (ZESTRIL) 20 MG tablet Take 20 mg by mouth daily.    . metoprolol succinate (TOPROL-XL) 25 MG 24 hr tablet Take 25 mg by mouth daily.     . Multiple Vitamin (MULTIVITAMIN WITH MINERALS) TABS tablet Take 1 tablet by mouth daily. Women's Multivitamin 50+    . Omega 3-6-9 Fatty Acids (TRIPLE OMEGA-3-6-9 PO) Take 1 capsule by mouth daily.    . rosuvastatin (CRESTOR) 20 MG tablet Take 20 mg by mouth at bedtime.    . vitamin B-12 (CYANOCOBALAMIN) 500 MCG tablet Take 500 mcg by mouth daily.    Marland Kitchen  fluconazole (DIFLUCAN) 150 MG tablet Take 1 tablet (150 mg total) by mouth every 3 (three) days. (Patient not taking: Reported on 10/24/2019) 2 tablet 1  . nystatin cream (MYCOSTATIN) Apply 1 application topically 2 (two) times daily. (Patient not taking: Reported on 11/08/2019) 30 g 11  . nystatin-triamcinolone ointment (MYCOLOG) Apply 1 application topically 2 (two) times daily. (Patient not taking: Reported on 10/24/2019) 30 g 11  . predniSONE (DELTASONE) 20 MG tablet Take 5-20 mg by mouth as directed.     Manus Gunning BOWEL PREP KIT 17.5-3.13-1.6 GM/177ML SOLN Take 354 mLs by mouth as directed.     No current facility-administered medications for this visit.    Allergies as of 11/15/2019  . (No Known Allergies)     Family History  Problem Relation Age of Onset  . Heart attack Maternal Grandmother   . Cancer Maternal Grandfather   . Cancer Father        lung  . Other Mother        irregular heart beat  . Hypertension Mother   . Diabetes Brother   . Hypertension Brother   . High Cholesterol Brother   . Atrial fibrillation Brother   . Melanoma Brother        right ear  . Hypertension Sister   . High Cholesterol Sister   . Colon cancer Sister   . Rheum arthritis Sister   . Other Sister        irregular heart beat  . Hypertension Son   . Cancer Other        maternal side  . Stroke Other        maternal side    Social History   Socioeconomic History  . Marital status: Married    Spouse name: Not on file  . Number of children: Not on file  . Years of education: Not on file  . Highest education level: Not on file  Occupational History  . Not on file  Tobacco Use  . Smoking status: Never Smoker  . Smokeless tobacco: Never Used  Vaping Use  . Vaping Use: Never used  Substance and Sexual Activity  . Alcohol use: No    Alcohol/week: 0.0 standard drinks  . Drug use: No  . Sexual activity: Not Currently    Birth control/protection: Surgical    Comment: tubal  Other Topics Concern  . Not on file  Social History Narrative  . Not on file   Social Determinants of Health   Financial Resource Strain:   . Difficulty of Paying Living Expenses: Not on file  Food Insecurity:   . Worried About Charity fundraiser in the Last Year: Not on file  . Ran Out of Food in the Last Year: Not on file  Transportation Needs:   . Lack of Transportation (Medical): Not on file  . Lack of Transportation (Non-Medical): Not on file  Physical Activity:   . Days of Exercise per Week: Not on file  . Minutes of Exercise per Session: Not on file  Stress:   . Feeling of Stress : Not on file  Social Connections:   . Frequency of Communication with Friends and Family: Not on file  . Frequency of  Social Gatherings with Friends and Family: Not on file  . Attends Religious Services: Not on file  . Active Member of Clubs or Organizations: Not on file  . Attends Archivist Meetings: Not on file  . Marital Status: Not on file  Intimate  Partner Violence:   . Fear of Current or Ex-Partner: Not on file  . Emotionally Abused: Not on file  . Physically Abused: Not on file  . Sexually Abused: Not on file    Subjective: Review of Systems  Constitutional: Negative for chills and fever.  HENT: Negative for congestion and hearing loss.   Eyes: Negative for blurred vision and double vision.  Respiratory: Negative for cough and shortness of breath.   Cardiovascular: Negative for chest pain and palpitations.  Gastrointestinal: Positive for heartburn. Negative for abdominal pain, blood in stool, constipation, diarrhea, melena and vomiting.       Dysphagia  Genitourinary: Negative for dysuria and urgency.  Musculoskeletal: Negative for joint pain and myalgias.  Skin: Negative for itching and rash.  Neurological: Negative for dizziness and headaches.  Psychiatric/Behavioral: Negative for depression. The patient is not nervous/anxious.        Objective: BP (!) 143/84   Pulse 84   Temp (!) 97.3 F (36.3 C) (Temporal)   Ht 5' 6" (1.676 m)   Wt 201 lb (91.2 kg)   BMI 32.44 kg/m  Physical Exam Constitutional:      Appearance: Normal appearance.  HENT:     Head: Normocephalic and atraumatic.  Eyes:     Extraocular Movements: Extraocular movements intact.     Conjunctiva/sclera: Conjunctivae normal.  Cardiovascular:     Rate and Rhythm: Normal rate and regular rhythm.  Pulmonary:     Effort: Pulmonary effort is normal.     Breath sounds: Normal breath sounds.  Abdominal:     General: Bowel sounds are normal.     Palpations: Abdomen is soft.  Musculoskeletal:        General: No swelling. Normal range of motion.     Cervical back: Normal range of motion and neck supple.   Skin:    General: Skin is warm and dry.     Coloration: Skin is not jaundiced.  Neurological:     General: No focal deficit present.     Mental Status: She is alert and oriented to person, place, and time.  Psychiatric:        Mood and Affect: Mood normal.        Behavior: Behavior normal.      Assessment: *Dysphagia-progressively worsening *Acid reflux *Family history of colorectal cancer  Plan: Will schedule for EGD to evaluate for peptic ulcer disease, esophagitis, gastritis, H. Pylori, duodenitis, or other. Will also evaluate for esophageal stricture, Schatzki's ring, esophageal web or other. The risks including infection, bleed, or perforation as well as benefits, limitations, alternatives and imponderables have been reviewed with the patient. Potential for esophageal dilation, biopsy, etc. have also been reviewed.  Questions have been answered. All parties agreeable.  Discussed that we may need to start patient on PPI therapy pending her endoscopic results.  She understands.  At the same time as her EGD we will perform colonoscopy due to family history of colon cancer.  Further recommendations to follow.  Thank you Dr. Melford Aase for the kind referral.  11/15/2019 1:09 PM   Disclaimer: This note was dictated with voice recognition software. Similar sounding words can inadvertently be transcribed and may not be corrected upon review.

## 2019-11-15 NOTE — Patient Instructions (Signed)
We will schedule an EGD to be performed at the same time as your colonoscopy with possible balloon dilation.  We may need to start you on medication pending EGD findings.  Further recommendations to follow.  At [facility] we value your feedback. You may receive a survey about your visit today. Please share your experience as we strive to create trusting relationships with our patients to provide genuine, compassionate, quality care.  We appreciate your understanding and patience as we review any laboratory studies, imaging, and other diagnostic tests that are ordered as we care for you. Our office policy is 5 business days for review of these results, and any emergent or urgent results are addressed in a timely manner for your best interest. If you do not hear from our office in 1 week, please contact us.   We also encourage the use of MyChart, which contains your medical information for your review as well. If you are not enrolled in this feature, an access code is on this after visit summary for your convenience. Thank you for allowing [place] to be involved in your care.  It was great to see you today!  I hope you have a great rest of your fall!!      Danuta Huseman K. Abbey Chatters, D.O. Gastroenterology and Hepatology [facility]

## 2019-11-15 NOTE — H&P (View-Only) (Signed)
  Primary Care Physician:  Badger, Michael C, MD Primary Gastroenterologist:  Dr. Janne Faulk  Chief Complaint  Patient presents with  . Dysphagia    difficulty swallowing solid foods. went to pcp yesterday he recommended egd  has colonoscopy on 11/17/19    HPI:   Catherine Carney is a 69 y.o. female who presents to the clinic today by referral from her PCP Dr. Badger for evaluation.  She says she is scheduled to undergo colonoscopy due to family history of colon cancer in a few days.  She states that she is interested in having an EGD performed at the same time.  Notes progressively worsening dysphagia over the last year.  States that more recently over the last few weeks she has noted that foods will "get stuck" in her substernal region.  States is mainly with solids, no issues with liquids.  No chronic NSAID use.  No history of PUD.  No melena hematochezia.  No previous upper endoscopy.  Does note occasional reflux as well which she describes as indigestion.  Last colonoscopy was in 2015 largely unremarkable but she has more than 3 relatives with history of colon cancer.  Past Medical History:  Diagnosis Date  . Cataract    Combined form OU  . Family history of adverse reaction to anesthesia    sister had PONV  . Female bladder prolapse   . Hyperlipidemia   . Hypertension   . Hypertensive retinopathy    OU  . Macular hole of right eye   . Osteoarthritis     Past Surgical History:  Procedure Laterality Date  . 25 [address] PLANA VITRECTOMY WITH 20 GAUGE MVR PORT FOR MACULAR HOLE Right 04/20/2019   Procedure: 25 GAUGE PARS PLANA VITRECTOMY WITH 20 GAUGE MVR PORT FOR MACULAR HOLE WITH MEMBRANE PEEL;  Surgeon: Zamora, Brian, MD;  Location[facility];  Service: Ophthalmology;  Laterality: Right;  . CHOLECYSTECTOMY    . EYE SURGERY Right 04/20/2019   [facility] repair - PPV/MP - Dr. Brian Zamora  . GALLBLADDER SURGERY    . GAS/FLUID EXCHANGE Right 04/20/2019   Procedure: Gas/Fluid Exchange;   Surgeon: Zamora, Brian, MD;  Location: [facility];  Service: Ophthalmology;  Laterality: Right;  . PHOTOCOAGULATION WITH LASER Right 04/20/2019   Procedure: Photocoagulation With Laser;  Surgeon: Zamora, Brian, MD;  Location: [facility];  Service: Ophthalmology;  Laterality: Right;  . TUBAL LIGATION      Current Outpatient Medications  Medication Sig Dispense Refill  . acetaminophen (TYLENOL) 500 MG tablet Take 500 mg by mouth every 6 (six) hours as needed (for pain.).     . aspirin EC 81 MG tablet Take 81 mg by mouth daily.    . Calcium Carb-Cholecalciferol (CALCIUM 600+D) 600-800 MG-UNIT TABS Take 1 tablet by mouth daily.    . Coenzyme Q10 (COQ10) 100 MG CAPS Take 100 mg by mouth daily.    . lisinopril (ZESTRIL) 20 MG tablet Take 20 mg by mouth daily.    . metoprolol succinate (TOPROL-XL) 25 MG 24 hr tablet Take 25 mg by mouth daily.     . Multiple Vitamin (MULTIVITAMIN WITH MINERALS) TABS tablet Take 1 tablet by mouth daily. Women's Multivitamin 50+    . Omega 3-6-9 Fatty Acids (TRIPLE OMEGA-3-6-9 PO) Take 1 capsule by mouth daily.    . rosuvastatin (CRESTOR) 20 MG tablet Take 20 mg by mouth at bedtime.    . vitamin B-12 (CYANOCOBALAMIN) 500 MCG tablet Take 500 mcg by mouth daily.    .   fluconazole (DIFLUCAN) 150 MG tablet Take 1 tablet (150 mg total) by mouth every 3 (three) days. (Patient not taking: Reported on 10/24/2019) 2 tablet 1  . nystatin cream (MYCOSTATIN) Apply 1 application topically 2 (two) times daily. (Patient not taking: Reported on 11/08/2019) 30 g 11  . nystatin-triamcinolone ointment (MYCOLOG) Apply 1 application topically 2 (two) times daily. (Patient not taking: Reported on 10/24/2019) 30 g 11  . predniSONE (DELTASONE) 20 MG tablet Take 5-20 mg by mouth as directed.     Manus Gunning BOWEL PREP KIT 17.5-3.13-1.6 GM/177ML SOLN Take 354 mLs by mouth as directed.     No current facility-administered medications for this visit.    Allergies as of 11/15/2019  . (No Known Allergies)     Family History  Problem Relation Age of Onset  . Heart attack Maternal Grandmother   . Cancer Maternal Grandfather   . Cancer Father        lung  . Other Mother        irregular heart beat  . Hypertension Mother   . Diabetes Brother   . Hypertension Brother   . High Cholesterol Brother   . Atrial fibrillation Brother   . Melanoma Brother        right ear  . Hypertension Sister   . High Cholesterol Sister   . Colon cancer Sister   . Rheum arthritis Sister   . Other Sister        irregular heart beat  . Hypertension Son   . Cancer Other        maternal side  . Stroke Other        maternal side    Social History   Socioeconomic History  . Marital status: Married    Spouse name: Not on file  . Number of children: Not on file  . Years of education: Not on file  . Highest education level: Not on file  Occupational History  . Not on file  Tobacco Use  . Smoking status: Never Smoker  . Smokeless tobacco: Never Used  Vaping Use  . Vaping Use: Never used  Substance and Sexual Activity  . Alcohol use: No    Alcohol/week: 0.0 standard drinks  . Drug use: No  . Sexual activity: Not Currently    Birth control/protection: Surgical    Comment: tubal  Other Topics Concern  . Not on file  Social History Narrative  . Not on file   Social Determinants of Health   Financial Resource Strain:   . Difficulty of Paying Living Expenses: Not on file  Food Insecurity:   . Worried About Charity fundraiser in the Last Year: Not on file  . Ran Out of Food in the Last Year: Not on file  Transportation Needs:   . Lack of Transportation (Medical): Not on file  . Lack of Transportation (Non-Medical): Not on file  Physical Activity:   . Days of Exercise per Week: Not on file  . Minutes of Exercise per Session: Not on file  Stress:   . Feeling of Stress : Not on file  Social Connections:   . Frequency of Communication with Friends and Family: Not on file  . Frequency of  Social Gatherings with Friends and Family: Not on file  . Attends Religious Services: Not on file  . Active Member of Clubs or Organizations: Not on file  . Attends Archivist Meetings: Not on file  . Marital Status: Not on file  Intimate  Partner Violence:   . Fear of Current or Ex-Partner: Not on file  . Emotionally Abused: Not on file  . Physically Abused: Not on file  . Sexually Abused: Not on file    Subjective: Review of Systems  Constitutional: Negative for chills and fever.  HENT: Negative for congestion and hearing loss.   Eyes: Negative for blurred vision and double vision.  Respiratory: Negative for cough and shortness of breath.   Cardiovascular: Negative for chest pain and palpitations.  Gastrointestinal: Positive for heartburn. Negative for abdominal pain, blood in stool, constipation, diarrhea, melena and vomiting.       Dysphagia  Genitourinary: Negative for dysuria and urgency.  Musculoskeletal: Negative for joint pain and myalgias.  Skin: Negative for itching and rash.  Neurological: Negative for dizziness and headaches.  Psychiatric/Behavioral: Negative for depression. The patient is not nervous/anxious.        Objective: BP (!) 143/84   Pulse 84   Temp (!) 97.3 F (36.3 C) (Temporal)   Ht 5' 6" (1.676 m)   Wt 201 lb (91.2 kg)   BMI 32.44 kg/m  Physical Exam Constitutional:      Appearance: Normal appearance.  HENT:     Head: Normocephalic and atraumatic.  Eyes:     Extraocular Movements: Extraocular movements intact.     Conjunctiva/sclera: Conjunctivae normal.  Cardiovascular:     Rate and Rhythm: Normal rate and regular rhythm.  Pulmonary:     Effort: Pulmonary effort is normal.     Breath sounds: Normal breath sounds.  Abdominal:     General: Bowel sounds are normal.     Palpations: Abdomen is soft.  Musculoskeletal:        General: No swelling. Normal range of motion.     Cervical back: Normal range of motion and neck supple.   Skin:    General: Skin is warm and dry.     Coloration: Skin is not jaundiced.  Neurological:     General: No focal deficit present.     Mental Status: She is alert and oriented to person, place, and time.  Psychiatric:        Mood and Affect: Mood normal.        Behavior: Behavior normal.      Assessment: *Dysphagia-progressively worsening *Acid reflux *Family history of colorectal cancer  Plan: Will schedule for EGD to evaluate for peptic ulcer disease, esophagitis, gastritis, H. Pylori, duodenitis, or other. Will also evaluate for esophageal stricture, Schatzki's ring, esophageal web or other. The risks including infection, bleed, or perforation as well as benefits, limitations, alternatives and imponderables have been reviewed with the patient. Potential for esophageal dilation, biopsy, etc. have also been reviewed.  Questions have been answered. All parties agreeable.  Discussed that we may need to start patient on PPI therapy pending her endoscopic results.  She understands.  At the same time as her EGD we will perform colonoscopy due to family history of colon cancer.  Further recommendations to follow.  Thank you Dr. Melford Aase for the kind referral.  11/15/2019 1:09 PM   Disclaimer: This note was dictated with voice recognition software. Similar sounding words can inadvertently be transcribed and may not be corrected upon review.

## 2019-11-16 ENCOUNTER — Telehealth: Payer: Self-pay | Admitting: *Deleted

## 2019-11-16 NOTE — Telephone Encounter (Signed)
Fax received. Sent to endo. Lab result sent to be scanned

## 2019-11-16 NOTE — Telephone Encounter (Signed)
Received call from endo patient is covid +.  Procedure has to be rescheduled 21 days out. Will not have to be retested for COVID.  Called pt. She states she tested positive 8/16 in [place]. Spoke w/ melanie. If pt can get the results to [place] then she can have procedure done. Will fax to melanie once received.

## 2019-11-17 ENCOUNTER — Ambulatory Visit (HOSPITAL_COMMUNITY): Admission: RE | Admit: 2019-11-17 | Payer: Medicare HMO | Source: Home / Self Care

## 2019-11-17 ENCOUNTER — Ambulatory Visit (HOSPITAL_COMMUNITY): Payer: Medicare HMO

## 2019-11-17 ENCOUNTER — Other Ambulatory Visit: Payer: Self-pay

## 2019-11-17 ENCOUNTER — Ambulatory Visit (HOSPITAL_COMMUNITY)
Admission: RE | Admit: 2019-11-17 | Discharge: 2019-11-17 | Disposition: A | Discharge status: Home / Self Care | Payer: Medicare HMO | Attending: Internal Medicine | Admitting: Internal Medicine

## 2019-11-17 ENCOUNTER — Encounter (HOSPITAL_COMMUNITY)
Admission: RE | Disposition: A | Discharge status: Home / Self Care | Payer: Self-pay | Source: Home / Self Care | Attending: Internal Medicine

## 2019-11-17 ENCOUNTER — Ambulatory Visit (HOSPITAL_COMMUNITY): Payer: Medicare HMO | Admitting: Anesthesiology

## 2019-11-17 ENCOUNTER — Encounter (HOSPITAL_COMMUNITY): Payer: Self-pay

## 2019-11-17 ENCOUNTER — Encounter (HOSPITAL_COMMUNITY): Admission: RE | Payer: Self-pay | Source: Home / Self Care

## 2019-11-17 DIAGNOSIS — R1310 Dysphagia, unspecified: Secondary | ICD-10-CM | POA: Diagnosis not present

## 2019-11-17 DIAGNOSIS — Z800 Family history of malignant neoplasm of digestive organs: Secondary | ICD-10-CM | POA: Insufficient documentation

## 2019-11-17 DIAGNOSIS — K2970 Gastritis, unspecified, without bleeding: Secondary | ICD-10-CM | POA: Diagnosis not present

## 2019-11-17 DIAGNOSIS — Q438 Other specified congenital malformations of intestine: Secondary | ICD-10-CM | POA: Insufficient documentation

## 2019-11-17 DIAGNOSIS — I10 Essential (primary) hypertension: Secondary | ICD-10-CM | POA: Diagnosis not present

## 2019-11-17 DIAGNOSIS — H35039 Hypertensive retinopathy, unspecified eye: Secondary | ICD-10-CM | POA: Diagnosis not present

## 2019-11-17 DIAGNOSIS — K635 Polyp of colon: Secondary | ICD-10-CM

## 2019-11-17 DIAGNOSIS — E785 Hyperlipidemia, unspecified: Secondary | ICD-10-CM | POA: Insufficient documentation

## 2019-11-17 DIAGNOSIS — Z79899 Other long term (current) drug therapy: Secondary | ICD-10-CM | POA: Diagnosis not present

## 2019-11-17 DIAGNOSIS — K648 Other hemorrhoids: Secondary | ICD-10-CM | POA: Insufficient documentation

## 2019-11-17 DIAGNOSIS — M1990 Unspecified osteoarthritis, unspecified site: Secondary | ICD-10-CM | POA: Insufficient documentation

## 2019-11-17 DIAGNOSIS — Z1211 Encounter for screening for malignant neoplasm of colon: Secondary | ICD-10-CM | POA: Diagnosis present

## 2019-11-17 DIAGNOSIS — K319 Disease of stomach and duodenum, unspecified: Secondary | ICD-10-CM | POA: Insufficient documentation

## 2019-11-17 DIAGNOSIS — K449 Diaphragmatic hernia without obstruction or gangrene: Secondary | ICD-10-CM | POA: Insufficient documentation

## 2019-11-17 DIAGNOSIS — Z7982 Long term (current) use of aspirin: Secondary | ICD-10-CM | POA: Diagnosis not present

## 2019-11-17 DIAGNOSIS — K2100 Gastro-esophageal reflux disease with esophagitis, without bleeding: Secondary | ICD-10-CM | POA: Insufficient documentation

## 2019-11-17 DIAGNOSIS — D120 Benign neoplasm of cecum: Secondary | ICD-10-CM | POA: Diagnosis not present

## 2019-11-17 HISTORY — PX: COLONOSCOPY WITH PROPOFOL: SHX5780

## 2019-11-17 HISTORY — PX: ESOPHAGOGASTRODUODENOSCOPY (EGD) WITH PROPOFOL: SHX5813

## 2019-11-17 HISTORY — PX: POLYPECTOMY: SHX5525

## 2019-11-17 HISTORY — PX: BIOPSY: SHX5522

## 2019-11-17 HISTORY — PX: HEMOSTASIS CLIP PLACEMENT: SHX6857

## 2019-11-17 SURGERY — COLONOSCOPY WITH PROPOFOL
Anesthesia: Monitor Anesthesia Care

## 2019-11-17 SURGERY — COLONOSCOPY WITH PROPOFOL
Anesthesia: General

## 2019-11-17 MED ORDER — LACTATED RINGERS IV SOLN: INTRAVENOUS | Status: DC

## 2019-11-17 MED ORDER — OMEPRAZOLE 40 MG PO CPDR: 40.0000 mg | DELAYED_RELEASE_CAPSULE | Freq: Two times a day (BID) | ORAL | 5 refills | Status: DC

## 2019-11-17 MED ORDER — GLYCOPYRROLATE 0.2 MG/ML IJ SOLN
INTRAMUSCULAR | Status: AC
  Administered 2019-11-17: 0.2 mg via INTRAVENOUS
  Filled 2019-11-17: qty 1

## 2019-11-17 MED ORDER — CHLORHEXIDINE GLUCONATE CLOTH 2 % EX PADS: 6.0000 | MEDICATED_PAD | Freq: Once | CUTANEOUS | Status: DC

## 2019-11-17 MED ORDER — GLYCOPYRROLATE 0.2 MG/ML IJ SOLN: 0.2000 mg | Freq: Once | INTRAMUSCULAR | Status: AC

## 2019-11-17 MED ORDER — LIDOCAINE VISCOUS HCL 2 % MT SOLN: 15.0000 mL | Freq: Once | OROMUCOSAL | Status: AC

## 2019-11-17 MED ORDER — LIDOCAINE HCL (CARDIAC) PF 50 MG/5ML IV SOSY
PREFILLED_SYRINGE | INTRAVENOUS | Status: DC | PRN
  Administered 2019-11-17: 100 mg via INTRAVENOUS

## 2019-11-17 MED ORDER — STERILE WATER FOR IRRIGATION IR SOLN
Status: DC | PRN
  Administered 2019-11-17: 100 mL

## 2019-11-17 MED ORDER — PROPOFOL 500 MG/50ML IV EMUL
INTRAVENOUS | Status: DC | PRN
  Administered 2019-11-17: 75 ug/kg/min via INTRAVENOUS

## 2019-11-17 MED ORDER — PROPOFOL 10 MG/ML IV BOLUS
INTRAVENOUS | Status: DC | PRN
  Administered 2019-11-17: 10 mg via INTRAVENOUS
  Administered 2019-11-17: 20 mg via INTRAVENOUS
  Administered 2019-11-17: 120 mg via INTRAVENOUS

## 2019-11-17 MED ORDER — LIDOCAINE VISCOUS HCL 2 % MT SOLN
OROMUCOSAL | Status: AC
  Administered 2019-11-17: 15 mL via OROMUCOSAL
  Filled 2019-11-17: qty 15

## 2019-11-17 NOTE — Progress Notes (Signed)
To Radiology via wheelchair.

## 2019-11-17 NOTE — Op Note (Signed)
[facility] Patient Name: Catherine Carney Procedure Date: 11/17/2019 10:33 AM MRN: [id number] Date of Birth: 04-Feb-1951 Attending MD: Elon Alas. Abbey Chatters DO CSN: [id number] Age: 69 Admit Type: Outpatient Procedure:                Colonoscopy Indications:              Screening in patient at increased risk: Family                            history of 1st-degree relative with colorectal                            cancer Providers:                Elon Alas. Abbey Chatters, DO, Crystal Page, Randa Spike, Technician Referring MD:              Medicines:                See the Anesthesia note for documentation of the                            administered medications Complications:            Possible colon perforation Estimated Blood Loss:     Estimated blood loss was minimal. Procedure:                Pre-Anesthesia Assessment:                           - The anesthesia plan was to use monitored                            anesthesia care (MAC).                           After obtaining informed consent, the colonoscope                            was passed under direct vision. Throughout the                            procedure, the patient's blood pressure, pulse, and                            oxygen saturations were monitored continuously. The                            PCF-H190DL ([id number] scope was introduced through                            the anus and advanced to the the cecum, identified                            by appendiceal orifice and ileocecal valve. The  colonoscopy was technically difficult and complex                            due to a redundant colon and significant looping.                            The patient tolerated the procedure well. The                            quality of the bowel preparation was evaluated                            using the BBPS [facility] Bowel Preparation Scale)                             with scores of: Right Colon = 2 (minor amount of                            residual staining, small fragments of stool and/or                            opaque liquid, but mucosa seen well), Transverse                            Colon = 3 (entire mucosa seen well with no residual                            staining, small fragments of stool or opaque                            liquid) and Left Colon = 3 (entire mucosa seen well                            with no residual staining, small fragments of stool                            or opaque liquid). The total BBPS score equals 8.                            The quality of the bowel preparation was good. Scope In: 10:35:50 AM Scope Out: 11:03:35 AM Scope Withdrawal Time: 0 hours 23 minutes 23 seconds  Total Procedure Duration: 0 hours 27 minutes 45 seconds  Findings:      The perianal and digital rectal examinations were normal.      Non-bleeding internal hemorrhoids were found during endoscopy.      A 13 mm polyp was found in the cecum. The polyp was sessile. The polyp       was removed with a hot snare. Resection and retrieval were complete. To       prevent bleeding after the polypectomy, one hemostatic clip was       successfully placed (MR conditional). There was no bleeding at the end       of the procedure.  A 11 mm polyp was found in the ileocecal valve. The polyp was flat.       Preparations were made for mucosal resection. Orise gel was injected       with adequate lift of the lesion from the muscularis propria. Margins       were well demarcated. Snare mucosal resection was performed. An 8 mm       area was resected. I then used cold forceps to retrieve remaining polyp       tissue. After biopsy there appeared to be small amound of yellow tissue       protruding through defect, possible lipomatous tissue from fatty IC       valve versus bowel perforation. The defect was closed using two       hemostatic clips.  Procedure was then aborted. There was no bleeding at       the end of the procedure.      - Of note, patient numerous other large polyps in her ascending colon       which were left behind. Impression:               - Non-bleeding internal hemorrhoids.                           - One 13 mm polyp in the cecum, removed with a hot                            snare. Resected and retrieved. Clip (MR                            conditional) was placed.                           - One 11 mm polyp at the ileocecal valve, removed                            with mucosal resection. Resected and retrieved.                           - Mucosal resection was performed. Resection and                            retrieval were complete. Moderate Sedation:      Per Anesthesia Care Recommendation:           - I discussed case in depth with surgery. Urgent CT                            imaging shows no evidence of bowel perforation.                            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                           - Multiple large ascending unresectable colon                            polyps left behind.. She  will likely need right                            hemicolectomy in the near future. I will refer to                            surgery to further discuss.                           - Patient has a contact number available for                            emergencies. The signs and symptoms of potential                            delayed complications were discussed with the                            patient. Return to normal activities tomorrow.                            Written discharge instructions were provided to the                            patient.                           - Resume previous diet.                           - Continue present medications.                           - Await pathology results.                           - Repeat colonoscopy in 1 year for surveillance. Procedure  Code(s):        --- Professional ---                           [id number], Colonoscopy, flexible; with endoscopic                            mucosal resection                           45385, 90, Colonoscopy, flexible; with removal of                            tumor(s), polyp(s), or other lesion(s) by snare                            technique Diagnosis Code(s):        --- Professional ---                           Z80.0, Family history  of malignant neoplasm of                            digestive organs                           K63.5, Polyp of colon                           K64.8, Other hemorrhoids CPT copyright 2019 American Medical Association. All rights reserved. The codes documented in this report are preliminary and upon coder review may  be revised to meet current compliance requirements. Elon Alas. Abbey Chatters, DO [place] Abbey Chatters, DO 11/17/2019 1:24:18 PM This report has been signed electronically. Number of Addenda: 0

## 2019-11-17 NOTE — Progress Notes (Signed)
Sister Bjorn Pippin in with patient in postoperative room #3.

## 2019-11-17 NOTE — Anesthesia Postprocedure Evaluation (Signed)
Anesthesia Post Note  Patient: Catherine Carney  Procedure(s) Performed: COLONOSCOPY WITH PROPOFOL (N/A ) ESOPHAGOGASTRODUODENOSCOPY (EGD) WITH PROPOFOL (N/A ) BIOPSY POLYPECTOMY HEMOSTASIS CLIP PLACEMENT  Patient location during evaluation: Endoscopy Anesthesia Type: General Level of consciousness: awake and alert and patient cooperative Pain management: satisfactory to patient Vital Signs Assessment: post-procedure vital signs reviewed and stable Respiratory status: spontaneous breathing Cardiovascular status: stable Postop Assessment: no apparent nausea or vomiting Anesthetic complications: no   No complications documented.   Last Vitals:  Vitals:   11/17/19 1109 11/17/19 1119  BP: 125/63 127/68  Pulse: 76 78  Resp: 18 18  Temp: 36.7 C   SpO2: 98% 98%    Last Pain:  Vitals:   11/17/19 1119  TempSrc:   PainSc: 0-No pain                 Edeline Greening

## 2019-11-17 NOTE — Transfer of Care (Signed)
Immediate Anesthesia Transfer of Care Note  Patient: Catherine Carney  Procedure(s) Performed: COLONOSCOPY WITH PROPOFOL (N/A ) ESOPHAGOGASTRODUODENOSCOPY (EGD) WITH PROPOFOL (N/A ) BIOPSY POLYPECTOMY HEMOSTASIS CLIP PLACEMENT  Patient Location: Endoscopy Unit  Anesthesia Type:General  Level of Consciousness: awake and patient cooperative  Airway & Oxygen Therapy: Patient Spontanous Breathing  Post-op Assessment: Report given to RN and Post -op Vital signs reviewed and stable  Post vital signs: Reviewed and stable  Last Vitals:  Vitals Value Taken Time  BP    Temp 98   Pulse    Resp    SpO2      Last Pain:  Vitals:   11/17/19 1024  TempSrc:   PainSc: 0-No pain      Patients Stated Pain Goal: 9 (35/33/17 4099)  Complications: No complications documented.

## 2019-11-17 NOTE — Progress Notes (Signed)
Dr. Abbey Chatters in to speak to patient and her sister regarding CT results.

## 2019-11-17 NOTE — Anesthesia Procedure Notes (Addendum)
Date/Time: 11/17/2019 10:23 AM Performed by: Vista Deck, CRNA Pre-anesthesia Checklist: Patient identified, Emergency Drugs available, Suction available, Timeout performed and Patient being monitored Patient Re-evaluated:Patient Re-evaluated prior to induction Oxygen Delivery Method: Nasal Cannula

## 2019-11-17 NOTE — Anesthesia Preprocedure Evaluation (Signed)
Anesthesia Evaluation  Patient identified by MRN, date of birth, ID band Patient awake    Reviewed: Allergy & Precautions, H&P , NPO status , Patient's Chart, lab work & pertinent test results, reviewed documented beta blocker date and time   Airway Mallampati: II  TM Distance: >3 FB Neck ROM: full    Dental no notable dental hx. (+) Teeth Intact   Pulmonary neg pulmonary ROS,    Pulmonary exam normal breath sounds clear to auscultation       Cardiovascular Exercise Tolerance: Good hypertension, negative cardio ROS   Rhythm:regular Rate:Normal     Neuro/Psych negative neurological ROS  negative psych ROS   GI/Hepatic negative GI ROS, Neg liver ROS,   Endo/Other  negative endocrine ROS  Renal/GU negative Renal ROS  negative genitourinary   Musculoskeletal   Abdominal   Peds  Hematology negative hematology ROS (+)   Anesthesia Other Findings   Reproductive/Obstetrics negative OB ROS                             Anesthesia Physical Anesthesia Plan  ASA: II  Anesthesia Plan: General   Post-op Pain Management:    Induction:   PONV Risk Score and Plan: Propofol infusion  Airway Management Planned:   Additional Equipment:   Intra-op Plan:   Post-operative Plan:   Informed Consent: I have reviewed the patients History and Physical, chart, labs and discussed the procedure including the risks, benefits and alternatives for the proposed anesthesia with the patient or authorized representative who has indicated his/her understanding and acceptance.     Dental Advisory Given  Plan Discussed with: CRNA  Anesthesia Plan Comments:         Anesthesia Quick Evaluation  

## 2019-11-17 NOTE — Discharge Instructions (Addendum)
EGD Discharge instructions Please read the instructions outlined below and refer to this sheet in the next few weeks. These discharge instructions provide you with general information on caring for yourself after you leave the hospital. Your doctor may also give you specific instructions. While your treatment has been planned according to the most current medical practices available, unavoidable complications occasionally occur. If you have any problems or questions after discharge, please call your doctor. ACTIVITY  You may resume your regular activity but move at a slower pace for the next 24 hours.   Take frequent rest periods for the next 24 hours.   Walking will help expel (get rid of) the air and reduce the bloated feeling in your abdomen.   No driving for 24 hours (because of the anesthesia (medicine) used during the test).   You may shower.   Do not sign any important legal documents or operate any machinery for 24 hours (because of the anesthesia used during the test).  NUTRITION  Drink plenty of fluids.   You may resume your normal diet.   Begin with a light meal and progress to your normal diet.   Avoid alcoholic beverages for 24 hours or as instructed by your caregiver.  MEDICATIONS  You may resume your normal medications unless your caregiver tells you otherwise.  WHAT YOU CAN EXPECT TODAY  You may experience abdominal discomfort such as a feeling of fullness or "gas" pains.  FOLLOW-UP  Your doctor will discuss the results of your test with you.  SEEK IMMEDIATE MEDICAL ATTENTION IF ANY OF THE FOLLOWING OCCUR:  Excessive nausea (feeling sick to your stomach) and/or vomiting.   Severe abdominal pain and distention (swelling).   Trouble swallowing.   Temperature over 101 F (37.8 C).   Rectal bleeding or vomiting of blood.     Colonoscopy Discharge Instructions  Read the instructions outlined below and refer to this sheet in the next few weeks. These  discharge instructions provide you with general information on caring for yourself after you leave the hospital. Your doctor may also give you specific instructions. While your treatment has been planned according to the most current medical practices available, unavoidable complications occasionally occur.   ACTIVITY  You may resume your regular activity, but move at a slower pace for the next 24 hours.   Take frequent rest periods for the next 24 hours.   Walking will help get rid of the air and reduce the bloated feeling in your belly (abdomen).   No driving for 24 hours (because of the medicine (anesthesia) used during the test).    Do not sign any important legal documents or operate any machinery for 24 hours (because of the anesthesia used during the test).  NUTRITION  Drink plenty of fluids.   You may resume your normal diet as instructed by your doctor.   Begin with a light meal and progress to your normal diet. Heavy or fried foods are harder to digest and may make you feel sick to your stomach (nauseated).   Avoid alcoholic beverages for 24 hours or as instructed.  MEDICATIONS  You may resume your normal medications unless your doctor tells you otherwise.  WHAT YOU CAN EXPECT TODAY  Some feelings of bloating in the abdomen.   Passage of more gas than usual.   Spotting of blood in your stool or on the toilet paper.  IF YOU HAD POLYPS REMOVED DURING THE COLONOSCOPY:  No aspirin products for 7 days or as instructed.  No alcohol for 7 days or as instructed.   Eat a soft diet for the next 24 hours.  FINDING OUT THE RESULTS OF YOUR TEST Not all test results are available during your visit. If your test results are not back during the visit, make an appointment with your caregiver to find out the results. Do not assume everything is normal if you have not heard from your caregiver or the medical facility. It is important for you to follow up on all of your test results.    SEEK IMMEDIATE MEDICAL ATTENTION IF:  You have more than a spotting of blood in your stool.   Your belly is swollen (abdominal distention).   You are nauseated or vomiting.   You have a temperature over 101.   You have abdominal pain or discomfort that is severe or gets worse throughout the day.   Your EGD showed a small hiatal hernia as well as inflammation in your esophagus.  I would like for you to take omeprazole 40 mg twice daily for the next 8 weeks and monitor for improvement.  If you are still having dysphagia at that time, we will consider repeat EGD with balloon dilation.    Your colonoscopy showed numerous large unresectable polyps in the right side of your colon.  As we discussed postoperatively, I would recommend surgical evaluation for possible right hemicolectomy.  My office will send in this referral next week.  Please call our office in 1 week if you have not heard from surgical office.  We will plan on surveillance colonoscopy in approximately 1 year.  Follow-up with GI in 6 to 8 weeks or as previously scheduled.  I hope you have a great rest of your week!  Elon Alas. Abbey Chatters, D.O. Gastroenterology and Hepatology [facility]     Monitored Anesthesia Care, Care After These instructions provide you with information about caring for yourself after your procedure. Your health care provider may also give you more specific instructions. Your treatment has been planned according to current medical practices, but problems sometimes occur. Call your health care provider if you have any problems or questions after your procedure. What can I expect after the procedure? After your procedure, you may:  Feel sleepy for several hours.  Feel clumsy and have poor balance for several hours.  Feel forgetful about what happened after the procedure.  Have poor judgment for several hours.  Feel nauseous or vomit.  Have a sore throat if you had a  breathing tube during the procedure. Follow these instructions at home: For at least 24 hours after the procedure:      Have a responsible adult stay with you. It is important to have someone help care for you until you are awake and alert.  Rest as needed.  Do not: ? Participate in activities in which you could fall or become injured. ? Drive. ? Use heavy machinery. ? Drink alcohol. ? Take sleeping pills or medicines that cause drowsiness. ? Make important decisions or sign legal documents. ? Take care of children on your own. Eating and drinking  Follow the diet that is recommended by your health care provider.  If you vomit, drink water, juice, or soup when you can drink without vomiting.  Make sure you have little or no nausea before eating solid foods. General instructions  Take over-the-counter and prescription medicines only as told by your health care provider.  If you have sleep apnea, surgery and certain medicines can increase your  risk for breathing problems. Follow instructions from your health care provider about wearing your sleep device: ? Anytime you are sleeping, including during daytime naps. ? While taking prescription pain medicines, sleeping medicines, or medicines that make you drowsy.  If you smoke, do not smoke without supervision.  Keep all follow-up visits as told by your health care provider. This is important. Contact a health care provider if:  You keep feeling nauseous or you keep vomiting.  You feel light-headed.  You develop a rash.  You have a fever. Get help right away if:  You have trouble breathing. Summary  For several hours after your procedure, you may feel sleepy and have poor judgment.  Have a responsible adult stay with you for at least 24 hours or until you are awake and alert. This information is not intended to replace advice given to you by your health care provider. Make sure you discuss any questions you have with  your health care provider. Document Revised: 04/26/2017 Document Reviewed: 05/19/2015 Elsevier Patient Education  [place].

## 2019-11-17 NOTE — Interval H&P Note (Signed)
History and Physical Interval Note:  11/17/2019 10:15 AM  Catherine Carney  has presented today for surgery, with the diagnosis of FH CRC, dysphagia.  The various methods of treatment have been discussed with the patient and family. After consideration of risks, benefits and other options for treatment, the patient has consented to  Procedure(s) with comments: COLONOSCOPY WITH PROPOFOL (N/A) - 11:15am ESOPHAGOGASTRODUODENOSCOPY (EGD) WITH PROPOFOL (N/A) BALLOON DILATION (N/A) as a surgical intervention.  The patient's history has been reviewed, patient examined, no change in status, stable for surgery.  I have reviewed the patient's chart and labs.  Questions were answered to the patient's satisfaction.     [name]

## 2019-11-17 NOTE — Op Note (Addendum)
[facility] Patient Name: Catherine Carney Procedure Date: 11/17/2019 10:17 AM MRN: [id number] Date of Birth: [birth year] Attending MD: Elon Alas. Abbey Chatters DO CSN: [id number] Age: 69 Admit Type: Outpatient Procedure:                Upper GI endoscopy Indications:              Dysphagia Providers:                Elon Alas. Abbey Chatters, DO, Crystal Page, Randa Spike, Technician Referring MD:              Medicines:                See the Anesthesia note for documentation of the                            administered medications Complications:            No immediate complications. Estimated Blood Loss:     Estimated blood loss was minimal. Procedure:                Pre-Anesthesia Assessment:                           - The anesthesia plan was to use monitored                            anesthesia care (MAC).                           After obtaining informed consent, the endoscope was                            passed under direct vision. Throughout the                            procedure, the patient's blood pressure, pulse, and                            oxygen saturations were monitored continuously. The                            [id number]-H190 ([id number]) scope was introduced through the                            mouth, and advanced to the second part of duodenum.                            The upper GI endoscopy was accomplished without                            difficulty. The patient tolerated the procedure                            well. Scope In: 10:28:47 AM Scope Out: 10:31:34  AM Total Procedure Duration: 0 hours 2 minutes 47 seconds  Findings:      A small hiatal hernia was present.      LA Grade A (one or more mucosal breaks less than 5 mm, not extending       between tops of 2 mucosal folds) esophagitis with no bleeding was found       in the lower third of the esophagus.      Diffuse moderate inflammation characterized by erosions and erythema  was       found in the entire examined stomach. Biopsies were taken with a cold       forceps for Helicobacter pylori testing.      The duodenal bulb, first portion of the duodenum and second portion of       the duodenum were normal. Impression:               - Small hiatal hernia.                           - LA Grade A reflux esophagitis with no bleeding.                           - Gastritis. Biopsied.                           - Normal duodenal bulb, first portion of the                            duodenum and second portion of the duodenum. Moderate Sedation:      Per Anesthesia Care Recommendation:           - Patient has a contact number available for                            emergencies. The signs and symptoms of potential                            delayed complications were discussed with the                            patient. Return to normal activities tomorrow.                            Written discharge instructions were provided to the                            patient.                           - Resume previous diet.                           - Continue present medications.                           - Await pathology results.                           -  Repeat upper endoscopy PRN for dilation if not                            improved on PPI therapy.                           - Return to GI clinic as previously scheduled.                           - Use Protonix (pantoprazole) 40 mg PO BID for 8                            weeks. Procedure Code(s):        --- Professional ---                           [phone], Esophagogastroduodenoscopy, flexible,                            transoral; with biopsy, single or multiple Diagnosis Code(s):        --- Professional ---                           K44.9, Diaphragmatic hernia without obstruction or                            gangrene                           K21.00, Gastro-esophageal reflux disease with                             esophagitis, without bleeding                           K29.70, Gastritis, unspecified, without bleeding                           R13.10, Dysphagia, unspecified CPT copyright 2019 American Medical Association. All rights reserved. The codes documented in this report are preliminary and upon coder review may  be revised to meet current compliance requirements. Elon Alas. Abbey Chatters, DO [place] Abbey Chatters, DO 11/17/2019 10:34:29 AM This report has been signed electronically. Number of Addenda: 0

## 2019-11-17 NOTE — Progress Notes (Signed)
Dr. Arnoldo Morale and Dr. Abbey Chatters in to speak to patient.

## 2019-11-20 ENCOUNTER — Other Ambulatory Visit: Payer: Self-pay

## 2019-11-20 LAB — SURGICAL PATHOLOGY

## 2019-11-24 ENCOUNTER — Telehealth: Payer: Self-pay | Admitting: *Deleted

## 2019-11-24 DIAGNOSIS — Z86010 Personal history of colonic polyps: Secondary | ICD-10-CM

## 2019-11-24 NOTE — Telephone Encounter (Signed)
Patient reports we were referring her to a surgeon. I do not see any referrals for patient to a surgeon. Please advise Dr. Abbey Chatters. thanks

## 2019-11-25 NOTE — Telephone Encounter (Signed)
Please refer patient to Dr. Arnoldo Morale who she has already met to discuss possible R hemi for multiple large ascending colon polyps. Thank you

## 2019-11-28 NOTE — Telephone Encounter (Signed)
Referral sent 

## 2019-11-28 NOTE — Addendum Note (Signed)
Addended by: Cheron Every on: 11/28/2019 07:47 AM   Modules accepted: Orders

## 2019-12-08 ENCOUNTER — Encounter (HOSPITAL_COMMUNITY): Payer: Self-pay | Admitting: Internal Medicine

## 2019-12-12 ENCOUNTER — Ambulatory Visit (INDEPENDENT_AMBULATORY_CARE_PROVIDER_SITE_OTHER): Payer: Medicare HMO | Admitting: General Surgery

## 2019-12-12 ENCOUNTER — Other Ambulatory Visit: Payer: Self-pay

## 2019-12-12 ENCOUNTER — Encounter: Payer: Self-pay | Admitting: General Surgery

## 2019-12-12 VITALS — BP 174/89 | HR 71 | Temp 98.5°F | Resp 14 | Ht 66.0 in | Wt 204.0 lb

## 2019-12-12 DIAGNOSIS — Z86010 Personal history of colonic polyps: Secondary | ICD-10-CM

## 2019-12-12 DIAGNOSIS — D122 Benign neoplasm of ascending colon: Secondary | ICD-10-CM

## 2019-12-12 MED ORDER — NEOMYCIN SULFATE 500 MG PO TABS: 1000.0000 mg | ORAL_TABLET | Freq: Three times a day (TID) | ORAL | 0 refills | Status: DC

## 2019-12-12 MED ORDER — SUPREP BOWEL PREP KIT 17.5-3.13-1.6 GM/177ML PO SOLN
1.0000 | Freq: Once | ORAL | 0 refills | Status: AC
Start: 2019-12-12 — End: 2019-12-12

## 2019-12-12 MED ORDER — METRONIDAZOLE 250 MG PO TABS
250.0000 mg | ORAL_TABLET | Freq: Three times a day (TID) | ORAL | 0 refills | Status: DC
Start: 2019-12-12 — End: 2019-12-28

## 2019-12-12 NOTE — Patient Instructions (Signed)
Open Colectomy An open colectomy is surgery to remove part or all of the large intestine (colon). This procedure may be used to treat several conditions, including:  Inflammation and infection of the colon (diverticulitis).  Tumors or masses in the colon.  Inflammatory bowel disease, such as Crohn disease or ulcerative colitis.  Bleeding from the colon.  Blockage or obstruction of the colon. Tell a health care provider about:  Any allergies you have.  All medicines you are taking, including vitamins, herbs, eye drops, creams, and over-the-counter medicines.  Any problems you or family members have had with anesthetic medicines.  Any blood disorders you have.  Any surgeries you have had.  Any medical conditions you have.  Whether you are pregnant or may be pregnant.  Whether you smoke or use tobacco products. These can affect your body's reaction to anesthesia. What are the risks? Generally, this is a safe procedure. However, problems may occur, including:  Infection.  Bleeding.  Allergic reactions to medicines.  Damage to other structures or organs.  Pneumonia.  The incision opening up.  Tissues from inside the abdomen bulging through the incision (hernia).  Reopening of the colon where it was stitched or stapled together.  A blood clot forming in a vein and traveling to the lungs.  Future blockage of the small intestine from scar tissue. What happens before the procedure?   Bowel prep In some cases, you may be prescribed an oral bowel prep to clean out your colon. If so:  Take it as told by your health care provider. Starting the day before your procedure, you may need to drink a large amount of medicated liquid. The liquid will cause you to have multiple loose stools until your stool is almost clear or light green.  Follow instructions from your health care provider about eating and drinking restrictions during bowel prep. Medicines  Ask your health  care provider about: ? Changing or stopping your regular medicines or vitamins. This is especially important if you are taking diabetes medicines, blood thinners, or vitamin E. ? Taking medicines such as aspirin and ibuprofen. These medicines can thin your blood. Do not take these medicines before your procedure if your health care provider instructs you not to.  If you were prescribed an antibiotic medicine, take it as told by your health care provider. General instructions  Bring loose-fitting, comfortable clothing and slip-on shoes that you can put on without bending over.  Make sure to see your health care provider for any tests that you need before the procedure, such as: ? Blood tests. ? A test to check the heart's rhythm (electrocardiogram, ECG). ? A CT scan of your abdomen. ? Urine tests. ? Colonoscopy.  Plan to have someone take you home from the hospital or clinic.  Arrange for someone to help you with your activities during your recovery. What happens during the procedure?  To reduce your risk of infection: ? Your health care team will wash or sanitize their hands. ? Your skin will be washed with soap. ? Hair may be removed from the surgical area.  An IV tube will be inserted into one of your veins. The tube will be used to give you medicines and fluids.  You will be given a medicine to make you fall asleep (general anesthetic). You may also be given a medicine to help you relax (sedative).  Small monitors will be connected to your body. They will be used to check your heart, blood pressure, and oxygen level.  A breathing tube may be placed into your lungs during the procedure.  A thin, flexible tube (catheter) will be placed into your bladder to drain urine.  A tube may be inserted through your nose and into your stomach (nasogastric tube, or NG tube). The tube is used to remove stomach fluids after surgery until the intestines start working again.  An incision will  be made in your abdomen.  Clamps or staples will be put on your colon.  The part of the colon between the clamps or staples will be removed.  The ends of the colon that remain will be stitched or stapled together.  The incision in your abdomen will be closed with stitches (sutures) or staples.  The incision will be covered with a bandage (dressing).  A small opening (stoma) may be created in your lower abdomen. A removable, external pouch (ostomy pouch) will be attached to the stoma. This pouch will collect stool outside of your body. Stool passes through the stoma and into the pouch instead of through your anus. The procedure may vary among health care providers and hospitals. What happens after the procedure?  Your blood pressure, heart rate, breathing rate, and blood oxygen level will be monitored until the medicines you were given have worn off.  You may continue to receive fluids and medicines through an IV tube.  You will start on a clear liquid diet and gradually go back to a normal diet.  Do not drive until your health care provider approves.  You may have some pain in your abdomen. You will be given pain medicine to control the pain.  You will be encouraged to do the following: ? Do breathing exercises to prevent pneumonia. ? Get up and start walking within a day after surgery. You should try to get up 5-6 times a day. This information is not intended to replace advice given to you by your health care provider. Make sure you discuss any questions you have with your health care provider. Document Revised: 07/22/2018 Document Reviewed: 10/28/2015 Elsevier Patient Education  [place].

## 2019-12-13 NOTE — Progress Notes (Signed)
Catherine Carney; [id number]; [birth year]   HPI Patient is a 69 year old white female who was referred to my care by Dr. Abbey Chatters of gastroenterology for evaluation and treatment of multiple ascending colon polyps.  This was found on routine colonoscopy.  A partial polypectomy of a polyp in the cecum revealed a tubular adenoma.  No dysplasia was noted.  Due to the multiple nature of the polyps and the concern for causing a perforation, it was elected to not remove any additional polyps at that time.  I initially saw the patient in the recovery area concerning the multiple polyps.  She was given the option to try a repeat colonoscopy at another facility in the future or to receive consultation for a right hemicolectomy.  Patient has been asymptomatic.  She denies any family history of colon cancer.  She has had no blood per rectum since the procedure. Past Medical History:  Diagnosis Date  . Cataract    Combined form OU  . Family history of adverse reaction to anesthesia    sister had PONV  . Female bladder prolapse   . Hyperlipidemia   . Hypertension   . Hypertensive retinopathy    OU  . Macular hole of right eye   . Osteoarthritis     Past Surgical History:  Procedure Laterality Date  . [place] VITRECTOMY WITH 20 GAUGE MVR PORT FOR MACULAR HOLE Right 04/20/2019   Procedure: 25 GAUGE PARS PLANA VITRECTOMY WITH 20 GAUGE MVR PORT FOR MACULAR HOLE WITH MEMBRANE PEEL;  Surgeon: Bernarda Caffey, MD;  Location: [facility];  Service: Ophthalmology;  Laterality: Right;  . BIOPSY  11/17/2019   Procedure: BIOPSY;  Surgeon: [name], DO;  [facility];  Service: Endoscopy;;  . CHOLECYSTECTOMY    . COLONOSCOPY WITH PROPOFOL N/A 11/17/2019   Procedure: COLONOSCOPY WITH PROPOFOL;  Surgeon: [name], DO;  [facility];  Service: Endoscopy;  Laterality: N/A;  11:15am  . ESOPHAGOGASTRODUODENOSCOPY (EGD) WITH PROPOFOL N/A 11/17/2019   Procedure: ESOPHAGOGASTRODUODENOSCOPY  (EGD) WITH PROPOFOL;  Surgeon: [name], DO;  [facility];  Service: Endoscopy;  Laterality: N/A;  . EYE SURGERY Right 04/20/2019   [facility] repair - PPV/MP - Dr. Bernarda Caffey  . GALLBLADDER SURGERY    . GAS/FLUID EXCHANGE Right 04/20/2019   Procedure: Gas/Fluid Exchange;  Surgeon: Bernarda Caffey, MD;  Location: [facility];  Service: Ophthalmology;  Laterality: Right;  . HEMOSTASIS CLIP PLACEMENT  11/17/2019   Procedure: HEMOSTASIS CLIP PLACEMENT;  Surgeon: [name], DO;  [facility];  Service: Endoscopy;;  . PHOTOCOAGULATION WITH LASER Right 04/20/2019   Procedure: Photocoagulation With Laser;  Surgeon: Bernarda Caffey, MD;  Location: [facility];  Service: Ophthalmology;  Laterality: Right;  . POLYPECTOMY  11/17/2019   Procedure: POLYPECTOMY;  Surgeon: [name], DO;  [facility];  Service: Endoscopy;;  . TUBAL LIGATION      Family History  Problem Relation Age of Onset  . Heart attack Maternal Grandmother   . Cancer Maternal Grandfather   . Cancer Father        lung  . Other Mother        irregular heart beat  . Hypertension Mother   . Diabetes Brother   . Hypertension Brother   . High Cholesterol Brother   . Atrial fibrillation Brother   . Melanoma Brother        right ear  . Hypertension Sister   . High  Cholesterol Sister   . Colon cancer Sister   . Rheum arthritis Sister   . Other Sister        irregular heart beat  . Hypertension Son   . Cancer Other        maternal side  . Stroke Other        maternal side    Current Outpatient Medications on File Prior to Visit  Medication Sig Dispense Refill  . acetaminophen (TYLENOL) 500 MG tablet Take 500 mg by mouth every 6 (six) hours as needed (for pain.).     Marland Kitchen aspirin EC 81 MG tablet Take 81 mg by mouth daily.    . Calcium Carb-Cholecalciferol (CALCIUM 600+D) 600-800 MG-UNIT TABS Take 1 tablet by mouth daily.    . Coenzyme Q10 (COQ10) 100 MG CAPS Take 100 mg by mouth daily.    Marland Kitchen  lisinopril (ZESTRIL) 20 MG tablet Take 20 mg by mouth daily.    . metoprolol succinate (TOPROL-XL) 25 MG 24 hr tablet Take 25 mg by mouth daily.     . Multiple Vitamin (MULTIVITAMIN WITH MINERALS) TABS tablet Take 1 tablet by mouth daily. Women's Multivitamin 50+    . Omega 3-6-9 Fatty Acids (TRIPLE OMEGA-3-6-9 PO) Take 1 capsule by mouth daily.    Marland Kitchen omeprazole (PRILOSEC) 40 MG capsule Take 1 capsule (40 mg total) by mouth 2 (two) times daily before a meal. 60 capsule 5  . rosuvastatin (CRESTOR) 20 MG tablet Take 20 mg by mouth at bedtime.    . vitamin B-12 (CYANOCOBALAMIN) 500 MCG tablet Take 500 mcg by mouth daily.     No current facility-administered medications on file prior to visit.    No Known Allergies  Social History   Substance and Sexual Activity  Alcohol Use No  . Alcohol/week: 0.0 standard drinks    Social History   Tobacco Use  Smoking Status Never Smoker  Smokeless Tobacco Never Used    Review of Systems  HENT: Positive for sinus pain.   Eyes: Positive for blurred vision.  Respiratory: Negative.   Cardiovascular: Negative.   Gastrointestinal: Positive for heartburn.  Genitourinary: Negative.   Musculoskeletal: Positive for back pain.  Skin: Negative.   Neurological: Negative.   Endo/Heme/Allergies: Negative.   Psychiatric/Behavioral: Negative.     Objective   Vitals:   12/12/19 1341  BP: (!) 174/89  Pulse: 71  Resp: 14  Temp: 98.5 F (36.9 C)  SpO2: 95%    Physical Exam Vitals reviewed.  Constitutional:      Appearance: Normal appearance. She is not ill-appearing.  HENT:     Head: Normocephalic and atraumatic.  Cardiovascular:     Rate and Rhythm: Normal rate and regular rhythm.     Heart sounds: Normal heart sounds. No murmur heard.  No friction rub. No gallop.   Pulmonary:     Effort: Pulmonary effort is normal. No respiratory distress.     Breath sounds: Normal breath sounds. No stridor. No wheezing, rhonchi or rales.  Abdominal:      General: There is no distension.     Palpations: Abdomen is soft. There is no mass.     Tenderness: There is no abdominal tenderness. There is no guarding or rebound.     Hernia: No hernia is present.  Skin:    General: Skin is warm and dry.  Neurological:     Mental Status: She is alert and oriented to person, place, and time.   Colonoscopy and pathology reports reviewed  Assessment  Ascending colon polyps, tubular adenoma Plan   Patient is scheduled for a right hemicolectomy on 12/25/2019.  The risks and benefits of the procedure including bleeding, infection, anastomotic leak, and the possibility of a blood transfusion were fully explained to the patient, who gave informed consent.  Suprep, neomycin, and Flagyl have been ordered for preoperative bowel preparation.

## 2019-12-13 NOTE — H&P (Signed)
Catherine Carney; [id number]; [birth year]   HPI Patient is a 69 year old white female who was referred to my care by Dr. Abbey Chatters of gastroenterology for evaluation and treatment of multiple ascending colon polyps.  This was found on routine colonoscopy.  A partial polypectomy of a polyp in the cecum revealed a tubular adenoma.  No dysplasia was noted.  Due to the multiple nature of the polyps and the concern for causing a perforation, it was elected to not remove any additional polyps at that time.  I initially saw the patient in the recovery area concerning the multiple polyps.  She was given the option to try a repeat colonoscopy at another facility in the future or to receive consultation for a right hemicolectomy.  Patient has been asymptomatic.  She denies any family history of colon cancer.  She has had no blood per rectum since the procedure. Past Medical History:  Diagnosis Date  . Cataract    Combined form OU  . Family history of adverse reaction to anesthesia    sister had PONV  . Female bladder prolapse   . Hyperlipidemia   . Hypertension   . Hypertensive retinopathy    OU  . Macular hole of right eye   . Osteoarthritis     Past Surgical History:  Procedure Laterality Date  . [place] VITRECTOMY WITH 20 GAUGE MVR PORT FOR MACULAR HOLE Right 04/20/2019   Procedure: 25 GAUGE PARS PLANA VITRECTOMY WITH 20 GAUGE MVR PORT FOR MACULAR HOLE WITH MEMBRANE PEEL;  Surgeon: Bernarda Caffey, MD;  Location: [facility];  Service: Ophthalmology;  Laterality: Right;  . BIOPSY  11/17/2019   Procedure: BIOPSY;  Surgeon: [name], DO;  [facility];  Service: Endoscopy;;  . CHOLECYSTECTOMY    . COLONOSCOPY WITH PROPOFOL N/A 11/17/2019   Procedure: COLONOSCOPY WITH PROPOFOL;  Surgeon: [name], DO;  [facility];  Service: Endoscopy;  Laterality: N/A;  11:15am  . ESOPHAGOGASTRODUODENOSCOPY (EGD) WITH PROPOFOL N/A 11/17/2019   Procedure: ESOPHAGOGASTRODUODENOSCOPY  (EGD) WITH PROPOFOL;  Surgeon: [name], DO;  [facility];  Service: Endoscopy;  Laterality: N/A;  . EYE SURGERY Right 04/20/2019   [facility] repair - PPV/MP - Dr. Bernarda Caffey  . GALLBLADDER SURGERY    . GAS/FLUID EXCHANGE Right 04/20/2019   Procedure: Gas/Fluid Exchange;  Surgeon: Bernarda Caffey, MD;  Location: [place];  Service: Ophthalmology;  Laterality: Right;  . HEMOSTASIS CLIP PLACEMENT  11/17/2019   Procedure: HEMOSTASIS CLIP PLACEMENT;  Surgeon: [name], DO;  [facility];  Service: Endoscopy;;  . PHOTOCOAGULATION WITH LASER Right 04/20/2019   Procedure: Photocoagulation With Laser;  Surgeon: Bernarda Caffey, MD;  Location: [facility];  Service: Ophthalmology;  Laterality: Right;  . POLYPECTOMY  11/17/2019   Procedure: POLYPECTOMY;  Surgeon: [name], DO;  [facility];  Service: Endoscopy;;  . TUBAL LIGATION      Family History  Problem Relation Age of Onset  . Heart attack Maternal Grandmother   . Cancer Maternal Grandfather   . Cancer Father        lung  . Other Mother        irregular heart beat  . Hypertension Mother   . Diabetes Brother   . Hypertension Brother   . High Cholesterol Brother   . Atrial fibrillation Brother   . Melanoma Brother        right ear  . Hypertension Sister   . High  Cholesterol Sister   . Colon cancer Sister   . Rheum arthritis Sister   . Other Sister        irregular heart beat  . Hypertension Son   . Cancer Other        maternal side  . Stroke Other        maternal side    Current Outpatient Medications on File Prior to Visit  Medication Sig Dispense Refill  . acetaminophen (TYLENOL) 500 MG tablet Take 500 mg by mouth every 6 (six) hours as needed (for pain.).     Marland Kitchen aspirin EC 81 MG tablet Take 81 mg by mouth daily.    . Calcium Carb-Cholecalciferol (CALCIUM 600+D) 600-800 MG-UNIT TABS Take 1 tablet by mouth daily.    . Coenzyme Q10 (COQ10) 100 MG CAPS Take 100 mg by mouth daily.    Marland Kitchen  lisinopril (ZESTRIL) 20 MG tablet Take 20 mg by mouth daily.    . metoprolol succinate (TOPROL-XL) 25 MG 24 hr tablet Take 25 mg by mouth daily.     . Multiple Vitamin (MULTIVITAMIN WITH MINERALS) TABS tablet Take 1 tablet by mouth daily. Women's Multivitamin 50+    . Omega 3-6-9 Fatty Acids (TRIPLE OMEGA-3-6-9 PO) Take 1 capsule by mouth daily.    Marland Kitchen omeprazole (PRILOSEC) 40 MG capsule Take 1 capsule (40 mg total) by mouth 2 (two) times daily before a meal. 60 capsule 5  . rosuvastatin (CRESTOR) 20 MG tablet Take 20 mg by mouth at bedtime.    . vitamin B-12 (CYANOCOBALAMIN) 500 MCG tablet Take 500 mcg by mouth daily.     No current facility-administered medications on file prior to visit.    No Known Allergies  Social History   Substance and Sexual Activity  Alcohol Use No  . Alcohol/week: 0.0 standard drinks    Social History   Tobacco Use  Smoking Status Never Smoker  Smokeless Tobacco Never Used    Review of Systems  HENT: Positive for sinus pain.   Eyes: Positive for blurred vision.  Respiratory: Negative.   Cardiovascular: Negative.   Gastrointestinal: Positive for heartburn.  Genitourinary: Negative.   Musculoskeletal: Positive for back pain.  Skin: Negative.   Neurological: Negative.   Endo/Heme/Allergies: Negative.   Psychiatric/Behavioral: Negative.     Objective   Vitals:   12/12/19 1341  BP: (!) 174/89  Pulse: 71  Resp: 14  Temp: 98.5 F (36.9 C)  SpO2: 95%    Physical Exam Vitals reviewed.  Constitutional:      Appearance: Normal appearance. She is not ill-appearing.  HENT:     Head: Normocephalic and atraumatic.  Cardiovascular:     Rate and Rhythm: Normal rate and regular rhythm.     Heart sounds: Normal heart sounds. No murmur heard.  No friction rub. No gallop.   Pulmonary:     Effort: Pulmonary effort is normal. No respiratory distress.     Breath sounds: Normal breath sounds. No stridor. No wheezing, rhonchi or rales.  Abdominal:      General: There is no distension.     Palpations: Abdomen is soft. There is no mass.     Tenderness: There is no abdominal tenderness. There is no guarding or rebound.     Hernia: No hernia is present.  Skin:    General: Skin is warm and dry.  Neurological:     Mental Status: She is alert and oriented to person, place, and time.   Colonoscopy and pathology reports reviewed  Assessment  Ascending colon polyps, tubular adenoma Plan   Patient is scheduled for a right hemicolectomy on 12/25/2019.  The risks and benefits of the procedure including bleeding, infection, anastomotic leak, and the possibility of a blood transfusion were fully explained to the patient, who gave informed consent.  Suprep, neomycin, and Flagyl have been ordered for preoperative bowel preparation.

## 2019-12-20 NOTE — Patient Instructions (Signed)
Catherine Carney  12/20/2019     @PREFPERIOPPHARMACY @   Your procedure is scheduled on  12/25/2019.  Report to [facility] at  [phone]  A.M.  Call this number if you have problems the morning of surgery:  [phone]   Remember:  Follow the diet and prep instructions given to you by the office.                       Take these medicines the morning of surgery with A SIP OF WATER  Metoprolol, prilosec.    Do not wear jewelry, make-up or nail polish.  Do not wear lotions, powders, or perfumes. Please wear deodorant and brush your teeth.  Do not shave 48 hours prior to surgery.  Men may shave face and neck.  Do not bring valuables to the hospital.  [facility] is not responsible for any belongings or valuables.  Contacts, dentures or bridgework may not be worn into surgery.  Leave your suitcase in the car.  After surgery it may be brought to your room.  For patients admitted to the hospital, discharge time will be determined by your treatment team.  Patients discharged the day of surgery will not be allowed to drive home.   Name and phone number of your driver:   family Special instructions:   DO NOT smoke the morning of your procedure.  Please read over the following fact sheets that you were given. Pain Booklet, Coughing and Deep Breathing, Blood Transfusion Information, Lab Information, Open Heart Packet, Surgical Site Infection Prevention, Anesthesia Post-op Instructions and Care and Recovery After Surgery       Open Colectomy, Care After This sheet gives you information about how to care for yourself after your procedure. Your health care provider may also give you more specific instructions. If you have problems or questions, contact your health care provider. What can I expect after the procedure? After the procedure, it is common to have:  Pain in your abdomen, especially along your incision.  Tiredness. Your energy level will return to normal over the next  several weeks.  Constipation.  Nausea.  Difficulty urinating. Follow these instructions at home: Activity  You may be able to return to most of your normal activities within 1-2 weeks, such as working, walking up stairs, and sexual activity.  Avoid activities that require a lot of energy for 4-6 weeks after surgery, such as running, climbing, and lifting heavy objects. Ask your health care provider what activities are safe for you.  Take rest breaks during the day as needed.  Do not drive for 1-2 weeks or until your health care provider says that it is safe.  Do not drive or use heavy machinery while taking prescription pain medicines.  Do not lift anything that is heavier than 10 lb (4.3 kg) until your health care provider says that it is safe. Incision care   Follow instructions from your health care provider about how to take care of your incision. Make sure you: ? Wash your hands with soap and water before you change your bandage (dressing). If soap and water are not available, use hand sanitizer. ? Change your dressing as told by your health care provider. ? Leave stitches (sutures) or staples in place. These skin closures may need to stay in place for 2 weeks or longer.  Avoid wearing tight clothing around your incision.  Protect your incision area from the sun.  Check your incision area every day for signs of infection. Check for: ? More redness, swelling, or pain. ? More fluid or blood. ? Warmth. ? Pus or a bad smell. General instructions  Do not take baths, swim, or use a hot tub until your health care provider approves. Ask your health care provider when you may shower.  Take over-the-counter and prescription medicines, including stool softeners, only as told by your health care provider.  Eat a low-fat and low-fiber diet for the first 4 weeks after surgery.  Keep all follow-up visits as told by your health care provider. This is important. Contact a health  care provider if:  You have more redness, swelling, or pain around your incision.  You have more fluid or blood coming from your incision.  Your incision feels warm to the touch.  You have pus or a bad smell coming from your incision.  You have a fever or chills.  You do not have a bowel movement 2-3 days after surgery.  You cannot eat or drink for 24 hours or more.  You have persistent nausea and vomiting.  You have abdominal pain that gets worse and does not get better with medicine. Get help right away if:  You have chest pain.  You have shortness of breath.  You have pain or swelling in your legs.  Your incision breaks open after your sutures or staples have been removed.  You have bleeding from the rectum. This information is not intended to replace advice given to you by your health care provider. Make sure you discuss any questions you have with your health care provider. Document Revised: 01/08/2017 Document Reviewed: 10/28/2015 Elsevier Patient Education  2020 [place] Anesthesia, Adult, Care After This sheet gives you information about how to care for yourself after your procedure. Your health care provider may also give you more specific instructions. If you have problems or questions, contact your health care provider. What can I expect after the procedure? After the procedure, the following side effects are common:  Pain or discomfort at the IV site.  Nausea.  Vomiting.  Sore throat.  Trouble concentrating.  Feeling cold or chills.  Weak or tired.  Sleepiness and fatigue.  Soreness and body aches. These side effects can affect parts of the body that were not involved in surgery. Follow these instructions at home:  For at least 24 hours after the procedure:  Have a responsible adult stay with you. It is important to have someone help care for you until you are awake and alert.  Rest as needed.  Do not: ? Participate in  activities in which you could fall or become injured. ? Drive. ? Use heavy machinery. ? Drink alcohol. ? Take sleeping pills or medicines that cause drowsiness. ? Make important decisions or sign legal documents. ? Take care of children on your own. Eating and drinking  Follow any instructions from your health care provider about eating or drinking restrictions.  When you feel hungry, start by eating small amounts of foods that are soft and easy to digest (bland), such as toast. Gradually return to your regular diet.  Drink enough fluid to keep your urine pale yellow.  If you vomit, rehydrate by drinking water, juice, or clear broth. General instructions  If you have sleep apnea, surgery and certain medicines can increase your risk for breathing problems. Follow instructions from your health care provider about wearing your sleep device: ? Anytime you are sleeping, including during  daytime naps. ? While taking prescription pain medicines, sleeping medicines, or medicines that make you drowsy.  Return to your normal activities as told by your health care provider. Ask your health care provider what activities are safe for you.  Take over-the-counter and prescription medicines only as told by your health care provider.  If you smoke, do not smoke without supervision.  Keep all follow-up visits as told by your health care provider. This is important. Contact a health care provider if:  You have nausea or vomiting that does not get better with medicine.  You cannot eat or drink without vomiting.  You have pain that does not get better with medicine.  You are unable to pass urine.  You develop a skin rash.  You have a fever.  You have redness around your IV site that gets worse. Get help right away if:  You have difficulty breathing.  You have chest pain.  You have blood in your urine or stool, or you vomit blood. Summary  After the procedure, it is common to have a  sore throat or nausea. It is also common to feel tired.  Have a responsible adult stay with you for the first 24 hours after general anesthesia. It is important to have someone help care for you until you are awake and alert.  When you feel hungry, start by eating small amounts of foods that are soft and easy to digest (bland), such as toast. Gradually return to your regular diet.  Drink enough fluid to keep your urine pale yellow.  Return to your normal activities as told by your health care provider. Ask your health care provider what activities are safe for you. This information is not intended to replace advice given to you by your health care provider. Make sure you discuss any questions you have with your health care provider. Document Revised: 01/29/2017 Document Reviewed: 09/11/2016 Elsevier Patient Education  [place]. How to Use Chlorhexidine for Bathing Chlorhexidine gluconate (CHG) is a germ-killing (antiseptic) solution that is used to clean the skin. It can get rid of the bacteria that normally live on the skin and can keep them away for about 24 hours. To clean your skin with CHG, you may be given:  A CHG solution to use in the shower or as part of a sponge bath.  A prepackaged cloth that contains CHG. Cleaning your skin with CHG may help lower the risk for infection:  While you are staying in the intensive care unit of the hospital.  If you have a vascular access, such as a central line, to provide short-term or long-term access to your veins.  If you have a catheter to drain urine from your bladder.  If you are on a ventilator. A ventilator is a machine that helps you breathe by moving air in and out of your lungs.  After surgery. What are the risks? Risks of using CHG include:  A skin reaction.  Hearing loss, if CHG gets in your ears.  Eye injury, if CHG gets in your eyes and is not rinsed out.  The CHG product catching fire. Make sure that you avoid  smoking and flames after applying CHG to your skin. Do not use CHG:  If you have a chlorhexidine allergy or have previously reacted to chlorhexidine.  On babies younger than 1 months of age. How to use CHG solution  Use CHG only as told by your health care provider, and follow the instructions on the  label.  Use the full amount of CHG as directed. Usually, this is one bottle. During a shower Follow these steps when using CHG solution during a shower (unless your health care provider gives you different instructions): 1. Start the shower. 2. Use your normal soap and shampoo to wash your face and hair. 3. Turn off the shower or move out of the shower stream. 4. Pour the CHG onto a clean washcloth. Do not use any type of brush or rough-edged sponge. 5. Starting at your neck, lather your body down to your toes. Make sure you follow these instructions: ? If you will be having surgery, pay special attention to the part of your body where you will be having surgery. Scrub this area for at least 1 minute. ? Do not use CHG on your head or face. If the solution gets into your ears or eyes, rinse them well with water. ? Avoid your genital area. ? Avoid any areas of skin that have broken skin, cuts, or scrapes. ? Scrub your back and under your arms. Make sure to wash skin folds. 6. Let the lather sit on your skin for 1-2 minutes or as long as told by your health care provider. 7. Thoroughly rinse your entire body in the shower. Make sure that all body creases and crevices are rinsed well. 8. Dry off with a clean towel. Do not put any substances on your body afterward--such as powder, lotion, or perfume--unless you are told to do so by your health care provider. Only use lotions that are recommended by the manufacturer. 9. Put on clean clothes or pajamas. 10. If it is the night before your surgery, sleep in clean sheets.  During a sponge bath Follow these steps when using CHG solution during a  sponge bath (unless your health care provider gives you different instructions): 1. Use your normal soap and shampoo to wash your face and hair. 2. Pour the CHG onto a clean washcloth. 3. Starting at your neck, lather your body down to your toes. Make sure you follow these instructions: ? If you will be having surgery, pay special attention to the part of your body where you will be having surgery. Scrub this area for at least 1 minute. ? Do not use CHG on your head or face. If the solution gets into your ears or eyes, rinse them well with water. ? Avoid your genital area. ? Avoid any areas of skin that have broken skin, cuts, or scrapes. ? Scrub your back and under your arms. Make sure to wash skin folds. 4. Let the lather sit on your skin for 1-2 minutes or as long as told by your health care provider. 5. Using a different clean, wet washcloth, thoroughly rinse your entire body. Make sure that all body creases and crevices are rinsed well. 6. Dry off with a clean towel. Do not put any substances on your body afterward--such as powder, lotion, or perfume--unless you are told to do so by your health care provider. Only use lotions that are recommended by the manufacturer. 7. Put on clean clothes or pajamas. 8. If it is the night before your surgery, sleep in clean sheets. How to use CHG prepackaged cloths  Only use CHG cloths as told by your health care provider, and follow the instructions on the label.  Use the CHG cloth on clean, dry skin.  Do not use the CHG cloth on your head or face unless your health care provider tells you to.  When washing with the CHG cloth: ? Avoid your genital area. ? Avoid any areas of skin that have broken skin, cuts, or scrapes. Before surgery Follow these steps when using a CHG cloth to clean before surgery (unless your health care provider gives you different instructions): 1. Using the CHG cloth, vigorously scrub the part of your body where you will be  having surgery. Scrub using a back-and-forth motion for 3 minutes. The area on your body should be completely wet with CHG when you are done scrubbing. 2. Do not rinse. Discard the cloth and let the area air-dry. Do not put any substances on the area afterward, such as powder, lotion, or perfume. 3. Put on clean clothes or pajamas. 4. If it is the night before your surgery, sleep in clean sheets.  For general bathing Follow these steps when using CHG cloths for general bathing (unless your health care provider gives you different instructions). 1. Use a separate CHG cloth for each area of your body. Make sure you wash between any folds of skin and between your fingers and toes. Wash your body in the following order, switching to a new cloth after each step: ? The front of your neck, shoulders, and chest. ? Both of your arms, under your arms, and your hands. ? Your stomach and groin area, avoiding the genitals. ? Your right leg and foot. ? Your left leg and foot. ? The back of your neck, your back, and your buttocks. 2. Do not rinse. Discard the cloth and let the area air-dry. Do not put any substances on your body afterward--such as powder, lotion, or perfume--unless you are told to do so by your health care provider. Only use lotions that are recommended by the manufacturer. 3. Put on clean clothes or pajamas. Contact a health care provider if:  Your skin gets irritated after scrubbing.  You have questions about using your solution or cloth. Get help right away if:  Your eyes become very red or swollen.  Your eyes itch badly.  Your skin itches badly and is red or swollen.  Your hearing changes.  You have trouble seeing.  You have swelling or tingling in your mouth or throat.  You have trouble breathing.  You swallow any chlorhexidine. Summary  Chlorhexidine gluconate (CHG) is a germ-killing (antiseptic) solution that is used to clean the skin. Cleaning your skin with CHG may  help to lower your risk for infection.  You may be given CHG to use for bathing. It may be in a bottle or in a prepackaged cloth to use on your skin. Carefully follow your health care provider's instructions and the instructions on the product label.  Do not use CHG if you have a chlorhexidine allergy.  Contact your health care provider if your skin gets irritated after scrubbing. This information is not intended to replace advice given to you by your health care provider. Make sure you discuss any questions you have with your health care provider. Document Revised: 04/14/2018 Document Reviewed: 12/24/2016 Elsevier Patient Education  [place].

## 2019-12-22 ENCOUNTER — Other Ambulatory Visit: Payer: Self-pay

## 2019-12-22 ENCOUNTER — Ambulatory Visit (HOSPITAL_COMMUNITY)
Admission: RE | Admit: 2019-12-22 | Discharge: 2019-12-22 | Disposition: A | Discharge status: Home / Self Care | Payer: Medicare HMO | Source: Ambulatory Visit | Attending: General Surgery | Admitting: General Surgery

## 2019-12-22 ENCOUNTER — Encounter (HOSPITAL_COMMUNITY): Payer: Self-pay

## 2019-12-22 ENCOUNTER — Encounter (HOSPITAL_COMMUNITY)
Admission: RE | Admit: 2019-12-22 | Discharge: 2019-12-22 | Disposition: A | Discharge status: Home / Self Care | Payer: Medicare HMO | Source: Ambulatory Visit | Attending: General Surgery | Admitting: General Surgery

## 2019-12-22 ENCOUNTER — Other Ambulatory Visit (HOSPITAL_COMMUNITY)
Admission: RE | Admit: 2019-12-22 | Discharge: 2019-12-22 | Disposition: A | Discharge status: Home / Self Care | Payer: Medicare HMO | Source: Ambulatory Visit | Attending: General Surgery | Admitting: General Surgery

## 2019-12-22 DIAGNOSIS — K635 Polyp of colon: Secondary | ICD-10-CM | POA: Insufficient documentation

## 2019-12-22 DIAGNOSIS — Z20822 Contact with and (suspected) exposure to covid-19: Secondary | ICD-10-CM | POA: Insufficient documentation

## 2019-12-22 DIAGNOSIS — Z01818 Encounter for other preprocedural examination: Secondary | ICD-10-CM | POA: Insufficient documentation

## 2019-12-22 LAB — CBC WITH DIFFERENTIAL/PLATELET
Abs Immature Granulocytes: 0.01 10*3/uL (ref 0.00–0.07)
Basophils Absolute: 0 10*3/uL (ref 0.0–0.1)
Basophils Relative: 1 %
Eosinophils Absolute: 0.1 10*3/uL (ref 0.0–0.5)
Eosinophils Relative: 2 %
HCT: 38.3 % (ref 36.0–46.0)
Hemoglobin: 12.6 g/dL (ref 12.0–15.0)
Immature Granulocytes: 0 %
Lymphocytes Relative: 40 %
Lymphs Abs: 2.2 10*3/uL (ref 0.7–4.0)
MCH: 30.1 pg (ref 26.0–34.0)
MCHC: 32.9 g/dL (ref 30.0–36.0)
MCV: 91.6 fL (ref 80.0–100.0)
Monocytes Absolute: 0.5 10*3/uL (ref 0.1–1.0)
Monocytes Relative: 9 %
Neutro Abs: 2.8 10*3/uL (ref 1.7–7.7)
Neutrophils Relative %: 48 %
Platelets: 320 10*3/uL (ref 150–400)
RBC: 4.18 MIL/uL (ref 3.87–5.11)
RDW: 13.1 % (ref 11.5–15.5)
WBC: 5.6 10*3/uL (ref 4.0–10.5)
nRBC: 0 % (ref 0.0–0.2)

## 2019-12-22 LAB — TYPE AND SCREEN
ABO/RH(D): A POS
Antibody Screen: NEGATIVE

## 2019-12-22 LAB — COMPREHENSIVE METABOLIC PANEL
ALT: 21 U/L (ref 0–44)
AST: 20 U/L (ref 15–41)
Albumin: 4 g/dL (ref 3.5–5.0)
Alkaline Phosphatase: 51 U/L (ref 38–126)
Anion gap: 12 (ref 5–15)
BUN: 12 mg/dL (ref 8–23)
CO2: 25 mmol/L (ref 22–32)
Calcium: 9.4 mg/dL (ref 8.9–10.3)
Chloride: 106 mmol/L (ref 98–111)
Creatinine, Ser: 0.77 mg/dL (ref 0.44–1.00)
GFR, Estimated: >60 mL/min (ref >60)
Glucose, Bld: 104 mg/dL — ABNORMAL HIGH (ref 70–99)
Potassium: 4.3 mmol/L (ref 3.5–5.1)
Sodium: 143 mmol/L (ref 135–145)
Total Bilirubin: 0.5 mg/dL (ref 0.3–1.2)
Total Protein: 6.9 g/dL (ref 6.5–8.1)

## 2019-12-22 LAB — SARS CORONAVIRUS 2 (TAT 6-24 HRS): SARS Coronavirus 2: NEGATIVE

## 2019-12-23 LAB — CEA: CEA: 1.8 ng/mL (ref 0.0–4.7)

## 2019-12-25 ENCOUNTER — Encounter (HOSPITAL_COMMUNITY): Payer: Self-pay | Admitting: General Surgery

## 2019-12-25 ENCOUNTER — Inpatient Hospital Stay (HOSPITAL_COMMUNITY)
Admission: RE | Admit: 2019-12-25 | Discharge: 2019-12-28 | DRG: 330 | Disposition: A | Discharge status: Home / Self Care | Payer: Medicare HMO | Attending: General Surgery | Admitting: General Surgery

## 2019-12-25 ENCOUNTER — Encounter (HOSPITAL_COMMUNITY)
Admission: RE | Disposition: A | Discharge status: Home / Self Care | Payer: Self-pay | Source: Home / Self Care | Attending: General Surgery

## 2019-12-25 ENCOUNTER — Inpatient Hospital Stay (HOSPITAL_COMMUNITY): Payer: Medicare HMO | Admitting: Anesthesiology

## 2019-12-25 ENCOUNTER — Other Ambulatory Visit: Payer: Self-pay

## 2019-12-25 DIAGNOSIS — D122 Benign neoplasm of ascending colon: Principal | ICD-10-CM

## 2019-12-25 DIAGNOSIS — R710 Precipitous drop in hematocrit: Secondary | ICD-10-CM | POA: Diagnosis not present

## 2019-12-25 DIAGNOSIS — Z20822 Contact with and (suspected) exposure to covid-19: Secondary | ICD-10-CM | POA: Diagnosis not present

## 2019-12-25 DIAGNOSIS — Z79899 Other long term (current) drug therapy: Secondary | ICD-10-CM | POA: Diagnosis not present

## 2019-12-25 DIAGNOSIS — E785 Hyperlipidemia, unspecified: Secondary | ICD-10-CM | POA: Diagnosis not present

## 2019-12-25 DIAGNOSIS — Z85118 Personal history of other malignant neoplasm of bronchus and lung: Secondary | ICD-10-CM | POA: Diagnosis not present

## 2019-12-25 DIAGNOSIS — Z9049 Acquired absence of other specified parts of digestive tract: Secondary | ICD-10-CM

## 2019-12-25 DIAGNOSIS — Z7982 Long term (current) use of aspirin: Secondary | ICD-10-CM

## 2019-12-25 DIAGNOSIS — I10 Essential (primary) hypertension: Secondary | ICD-10-CM | POA: Diagnosis present

## 2019-12-25 DIAGNOSIS — Z8249 Family history of ischemic heart disease and other diseases of the circulatory system: Secondary | ICD-10-CM | POA: Diagnosis not present

## 2019-12-25 DIAGNOSIS — Z833 Family history of diabetes mellitus: Secondary | ICD-10-CM | POA: Diagnosis not present

## 2019-12-25 DIAGNOSIS — K660 Peritoneal adhesions (postprocedural) (postinfection): Secondary | ICD-10-CM | POA: Diagnosis not present

## 2019-12-25 DIAGNOSIS — K635 Polyp of colon: Secondary | ICD-10-CM | POA: Diagnosis present

## 2019-12-25 HISTORY — PX: PARTIAL COLECTOMY: SHX5273

## 2019-12-25 SURGERY — COLECTOMY, PARTIAL
Anesthesia: General | Site: Abdomen

## 2019-12-25 MED ORDER — CHLORHEXIDINE GLUCONATE CLOTH 2 % EX PADS: 6.0000 | MEDICATED_PAD | Freq: Once | CUTANEOUS | Status: DC

## 2019-12-25 MED ORDER — LIDOCAINE HCL (CARDIAC) PF 100 MG/5ML IV SOSY
PREFILLED_SYRINGE | INTRAVENOUS | Status: DC | PRN
  Administered 2019-12-25: 80 mg via INTRATRACHEAL

## 2019-12-25 MED ORDER — ONDANSETRON HCL 4 MG/2ML IJ SOLN: 4.0000 mg | Freq: Four times a day (QID) | INTRAMUSCULAR | Status: DC | PRN

## 2019-12-25 MED ORDER — HYDROMORPHONE HCL 1 MG/ML IJ SOLN
INTRAMUSCULAR | Status: AC
  Filled 2019-12-25: qty 0.5

## 2019-12-25 MED ORDER — BUPIVACAINE LIPOSOME 1.3 % IJ SUSP
INTRAMUSCULAR | Status: AC
  Filled 2019-12-25: qty 20

## 2019-12-25 MED ORDER — ENOXAPARIN SODIUM 40 MG/0.4ML ~~LOC~~ SOLN
40.0000 mg | SUBCUTANEOUS | Status: DC
  Administered 2019-12-26: 40 mg via SUBCUTANEOUS
  Filled 2019-12-25: qty 0.4

## 2019-12-25 MED ORDER — BUPIVACAINE LIPOSOME 1.3 % IJ SUSP
INTRAMUSCULAR | Status: DC | PRN
  Administered 2019-12-25: 20 mL

## 2019-12-25 MED ORDER — HYDROMORPHONE HCL 1 MG/ML IJ SOLN
0.2500 mg | INTRAMUSCULAR | Status: DC | PRN
  Administered 2019-12-25 (×2): 0.5 mg via INTRAVENOUS
  Filled 2019-12-25: qty 0.5

## 2019-12-25 MED ORDER — SODIUM CHLORIDE 0.9 % IV SOLN: INTRAVENOUS | Status: DC

## 2019-12-25 MED ORDER — MEPERIDINE HCL 50 MG/ML IJ SOLN: 6.2500 mg | INTRAMUSCULAR | Status: DC | PRN

## 2019-12-25 MED ORDER — LORAZEPAM 2 MG/ML IJ SOLN: 0.5000 mg | INTRAMUSCULAR | Status: DC | PRN

## 2019-12-25 MED ORDER — DEXAMETHASONE SODIUM PHOSPHATE 10 MG/ML IJ SOLN
INTRAMUSCULAR | Status: DC | PRN
  Administered 2019-12-25: 5 mg via INTRAVENOUS

## 2019-12-25 MED ORDER — LACTATED RINGERS IV SOLN: INTRAVENOUS | Status: DC | PRN

## 2019-12-25 MED ORDER — DEXAMETHASONE SODIUM PHOSPHATE 10 MG/ML IJ SOLN
INTRAMUSCULAR | Status: AC
  Filled 2019-12-25: qty 1

## 2019-12-25 MED ORDER — METOPROLOL SUCCINATE ER 25 MG PO TB24
25.0000 mg | ORAL_TABLET | Freq: Every day | ORAL | Status: DC
  Administered 2019-12-26 – 2019-12-28 (×3): 25 mg via ORAL
  Filled 2019-12-25 (×3): qty 1

## 2019-12-25 MED ORDER — LISINOPRIL 10 MG PO TABS
20.0000 mg | ORAL_TABLET | Freq: Every day | ORAL | Status: DC
  Administered 2019-12-26 – 2019-12-28 (×3): 20 mg via ORAL
  Filled 2019-12-25 (×3): qty 2

## 2019-12-25 MED ORDER — LIDOCAINE 2% (20 MG/ML) 5 ML SYRINGE
INTRAMUSCULAR | Status: AC
  Filled 2019-12-25: qty 5

## 2019-12-25 MED ORDER — ACETAMINOPHEN 650 MG RE SUPP: 650.0000 mg | Freq: Four times a day (QID) | RECTAL | Status: DC | PRN

## 2019-12-25 MED ORDER — ENOXAPARIN SODIUM 40 MG/0.4ML ~~LOC~~ SOLN
40.0000 mg | Freq: Once | SUBCUTANEOUS | Status: AC
  Administered 2019-12-25: 40 mg via SUBCUTANEOUS
  Filled 2019-12-25: qty 0.4

## 2019-12-25 MED ORDER — SIMETHICONE 80 MG PO CHEW
40.0000 mg | CHEWABLE_TABLET | Freq: Four times a day (QID) | ORAL | Status: DC | PRN
  Administered 2019-12-25: 40 mg via ORAL
  Filled 2019-12-25: qty 1

## 2019-12-25 MED ORDER — CHLORHEXIDINE GLUCONATE 0.12 % MT SOLN
15.0000 mL | Freq: Once | OROMUCOSAL | Status: AC
  Administered 2019-12-25: 15 mL via OROMUCOSAL
  Filled 2019-12-25: qty 15

## 2019-12-25 MED ORDER — ADULT MULTIVITAMIN W/MINERALS CH
1.0000 | ORAL_TABLET | Freq: Every day | ORAL | Status: DC
  Administered 2019-12-26 – 2019-12-28 (×3): 1 via ORAL
  Filled 2019-12-25 (×3): qty 1

## 2019-12-25 MED ORDER — SODIUM CHLORIDE 0.9 % IV SOLN
2.0000 g | INTRAVENOUS | Status: AC
  Administered 2019-12-25: 2 g via INTRAVENOUS
  Filled 2019-12-25: qty 2

## 2019-12-25 MED ORDER — SUGAMMADEX SODIUM 500 MG/5ML IV SOLN
INTRAVENOUS | Status: DC | PRN
  Administered 2019-12-25: 200 mg via INTRAVENOUS

## 2019-12-25 MED ORDER — ONDANSETRON 4 MG PO TBDP: 4.0000 mg | ORAL_TABLET | Freq: Four times a day (QID) | ORAL | Status: DC | PRN

## 2019-12-25 MED ORDER — KETOROLAC TROMETHAMINE 15 MG/ML IJ SOLN
15.0000 mg | Freq: Four times a day (QID) | INTRAMUSCULAR | Status: DC | PRN
  Administered 2019-12-26 (×2): 15 mg via INTRAVENOUS
  Filled 2019-12-25 (×2): qty 1

## 2019-12-25 MED ORDER — DIPHENHYDRAMINE HCL 12.5 MG/5ML PO ELIX: 12.5000 mg | ORAL_SOLUTION | Freq: Four times a day (QID) | ORAL | Status: DC | PRN

## 2019-12-25 MED ORDER — ALVIMOPAN 12 MG PO CAPS
12.0000 mg | ORAL_CAPSULE | ORAL | Status: AC
  Administered 2019-12-25: 12 mg via ORAL
  Filled 2019-12-25: qty 1

## 2019-12-25 MED ORDER — POVIDONE-IODINE 10 % EX OINT
TOPICAL_OINTMENT | CUTANEOUS | Status: AC
  Filled 2019-12-25: qty 2

## 2019-12-25 MED ORDER — LACTATED RINGERS IV SOLN: Freq: Once | INTRAVENOUS | Status: AC

## 2019-12-25 MED ORDER — METOPROLOL SUCCINATE ER 25 MG PO TB24: 25.0000 mg | ORAL_TABLET | Freq: Every day | ORAL | Status: DC

## 2019-12-25 MED ORDER — ACETAMINOPHEN 325 MG PO TABS
650.0000 mg | ORAL_TABLET | Freq: Four times a day (QID) | ORAL | Status: DC | PRN
  Administered 2019-12-26: 650 mg via ORAL
  Filled 2019-12-25: qty 2

## 2019-12-25 MED ORDER — FENTANYL CITRATE (PF) 100 MCG/2ML IJ SOLN
INTRAMUSCULAR | Status: DC | PRN
  Administered 2019-12-25: 50 ug via INTRAVENOUS
  Administered 2019-12-25: 100 ug via INTRAVENOUS
  Administered 2019-12-25 (×2): 50 ug via INTRAVENOUS

## 2019-12-25 MED ORDER — INFLUENZA VAC A&B SA ADJ QUAD 0.5 ML IM PRSY: 0.5000 mL | PREFILLED_SYRINGE | INTRAMUSCULAR | Status: DC

## 2019-12-25 MED ORDER — PROPOFOL 10 MG/ML IV BOLUS
INTRAVENOUS | Status: AC
  Filled 2019-12-25: qty 20

## 2019-12-25 MED ORDER — ROCURONIUM BROMIDE 10 MG/ML (PF) SYRINGE
PREFILLED_SYRINGE | INTRAVENOUS | Status: AC
  Filled 2019-12-25: qty 10

## 2019-12-25 MED ORDER — ORAL CARE MOUTH RINSE: 15.0000 mL | Freq: Once | OROMUCOSAL | Status: AC

## 2019-12-25 MED ORDER — FENTANYL CITRATE (PF) 250 MCG/5ML IJ SOLN
INTRAMUSCULAR | Status: AC
  Filled 2019-12-25: qty 5

## 2019-12-25 MED ORDER — ONDANSETRON HCL 4 MG/2ML IJ SOLN
INTRAMUSCULAR | Status: AC
  Filled 2019-12-25: qty 2

## 2019-12-25 MED ORDER — SEVOFLURANE IN SOLN
RESPIRATORY_TRACT | Status: AC
  Filled 2019-12-25: qty 250

## 2019-12-25 MED ORDER — ROSUVASTATIN CALCIUM 20 MG PO TABS
20.0000 mg | ORAL_TABLET | Freq: Every day | ORAL | Status: DC
  Administered 2019-12-25 – 2019-12-27 (×3): 20 mg via ORAL
  Filled 2019-12-25 (×3): qty 1

## 2019-12-25 MED ORDER — PROPOFOL 10 MG/ML IV BOLUS
INTRAVENOUS | Status: DC | PRN
  Administered 2019-12-25: 150 mg via INTRAVENOUS

## 2019-12-25 MED ORDER — HYDROCODONE-ACETAMINOPHEN 5-325 MG PO TABS: 1.0000 | ORAL_TABLET | ORAL | Status: DC | PRN

## 2019-12-25 MED ORDER — ROCURONIUM BROMIDE 10 MG/ML (PF) SYRINGE
PREFILLED_SYRINGE | INTRAVENOUS | Status: DC | PRN
  Administered 2019-12-25: 50 mg via INTRAVENOUS
  Administered 2019-12-25: 10 mg via INTRAVENOUS
  Administered 2019-12-25: 5 mg via INTRAVENOUS

## 2019-12-25 MED ORDER — KETOROLAC TROMETHAMINE 15 MG/ML IJ SOLN
15.0000 mg | Freq: Four times a day (QID) | INTRAMUSCULAR | Status: AC
  Administered 2019-12-25: 15 mg via INTRAVENOUS

## 2019-12-25 MED ORDER — 0.9 % SODIUM CHLORIDE (POUR BTL) OPTIME
TOPICAL | Status: DC | PRN
  Administered 2019-12-25: 2000 mL

## 2019-12-25 MED ORDER — FENTANYL CITRATE (PF) 100 MCG/2ML IJ SOLN: 25.0000 ug | INTRAMUSCULAR | Status: DC | PRN

## 2019-12-25 MED ORDER — DIPHENHYDRAMINE HCL 50 MG/ML IJ SOLN: 12.5000 mg | Freq: Four times a day (QID) | INTRAMUSCULAR | Status: DC | PRN

## 2019-12-25 MED ORDER — ALVIMOPAN 12 MG PO CAPS
12.0000 mg | ORAL_CAPSULE | Freq: Two times a day (BID) | ORAL | Status: DC
  Administered 2019-12-26: 12 mg via ORAL
  Filled 2019-12-25: qty 1

## 2019-12-25 MED ORDER — ONDANSETRON HCL 4 MG/2ML IJ SOLN
INTRAMUSCULAR | Status: DC | PRN
  Administered 2019-12-25: 4 mg via INTRAVENOUS

## 2019-12-25 MED ORDER — ONDANSETRON HCL 4 MG/2ML IJ SOLN: 4.0000 mg | Freq: Once | INTRAMUSCULAR | Status: DC | PRN

## 2019-12-25 MED ORDER — POVIDONE-IODINE 10 % OINT PACKET
TOPICAL_OINTMENT | CUTANEOUS | Status: DC | PRN
  Administered 2019-12-25: 2 via TOPICAL

## 2019-12-25 SURGICAL SUPPLY — 36 items
COVER LIGHT HANDLE STERIS (MISCELLANEOUS) ×12 IMPLANT
COVER WAND RF STERILE (DRAPES) ×3 IMPLANT
DRSG OPSITE POSTOP 4X8 (GAUZE/BANDAGES/DRESSINGS) ×2 IMPLANT
ELECT REM PT RETURN 9FT ADLT (ELECTROSURGICAL) ×3
ELECTRODE REM PT RTRN 9FT ADLT (ELECTROSURGICAL) ×1 IMPLANT
GLOVE BIO SURGEON STRL SZ 6.5 (GLOVE) ×5 IMPLANT
GLOVE BIO SURGEONS STRL SZ 6.5 (GLOVE) ×3
GLOVE BIOGEL PI IND STRL 6.5 (GLOVE) ×2 IMPLANT
GLOVE BIOGEL PI IND STRL 7.0 (GLOVE) ×5 IMPLANT
GLOVE BIOGEL PI INDICATOR 6.5 (GLOVE) ×6
GLOVE BIOGEL PI INDICATOR 7.0 (GLOVE) ×10
GLOVE ECLIPSE 6.5 STRL STRAW (GLOVE) ×4 IMPLANT
GLOVE SURG SS PI 7.5 STRL IVOR (GLOVE) ×6 IMPLANT
GOWN STRL REUS W/TWL LRG LVL3 (GOWN DISPOSABLE) ×18 IMPLANT
INST SET MAJOR GENERAL (KITS) ×3 IMPLANT
KIT TURNOVER KIT A (KITS) ×3 IMPLANT
LIGASURE IMPACT 36 18CM CVD LR (INSTRUMENTS) ×3 IMPLANT
MANIFOLD NEPTUNE II (INSTRUMENTS) ×3 IMPLANT
NEEDLE HYPO 18GX1.5 BLUNT FILL (NEEDLE) ×3 IMPLANT
NS IRRIG 1000ML POUR BTL (IV SOLUTION) ×6 IMPLANT
PACK COLON (CUSTOM PROCEDURE TRAY) ×3 IMPLANT
PAD ARMBOARD 7.5X6 YLW CONV (MISCELLANEOUS) ×3 IMPLANT
PENCIL SMOKE EVACUATOR COATED (MISCELLANEOUS) ×3 IMPLANT
RELOAD PROXIMATE 75MM BLUE (ENDOMECHANICALS) ×6 IMPLANT
RETRACTOR WND ALEXIS 25 LRG (MISCELLANEOUS) IMPLANT
RTRCTR WOUND ALEXIS 25CM LRG (MISCELLANEOUS) ×3
SPONGE LAP 18X18 RF (DISPOSABLE) ×8 IMPLANT
STAPLER GUN LINEAR PROX 60 (STAPLE) ×5 IMPLANT
STAPLER PROXIMATE 75MM BLUE (STAPLE) ×2 IMPLANT
STAPLER VISISTAT (STAPLE) ×3 IMPLANT
SUT CHROMIC 2 0 SH (SUTURE) ×3 IMPLANT
SUT NOVA NAB GS-26 0 60 (SUTURE) ×6 IMPLANT
SUT PDS AB 0 CTX 60 (SUTURE) ×4 IMPLANT
SUT SILK 3 0 SH CR/8 (SUTURE) ×9 IMPLANT
TRAY FOLEY MTR SLVR 16FR STAT (SET/KITS/TRAYS/PACK) ×3 IMPLANT
YANKAUER SUCT BULB TIP 10FT TU (MISCELLANEOUS) ×3 IMPLANT

## 2019-12-25 NOTE — Anesthesia Procedure Notes (Signed)
Procedure Name: Intubation Date/Time: 12/25/2019 9:00 AM Performed by: Karna Dupes, CRNA Pre-anesthesia Checklist: Patient identified, Emergency Drugs available, Suction available and Patient being monitored Patient Re-evaluated:Patient Re-evaluated prior to induction Oxygen Delivery Method: Circle system utilized Preoxygenation: Pre-oxygenation with 100% oxygen Induction Type: IV induction Ventilation: Mask ventilation without difficulty Laryngoscope Size: Mac and 3 Grade View: Grade I Tube type: Oral Number of attempts: 1 Airway Equipment and Method: Stylet Placement Confirmation: ETT inserted through vocal cords under direct vision,  positive ETCO2 and breath sounds checked- equal and bilateral Secured at: 21 cm Tube secured with: Tape Dental Injury: Teeth and Oropharynx as per pre-operative assessment

## 2019-12-25 NOTE — Anesthesia Preprocedure Evaluation (Addendum)
Anesthesia Evaluation  Patient identified by MRN, date of birth, ID band Patient awake    Reviewed: Allergy & Precautions, NPO status , Patient's Chart, lab work & pertinent test results, reviewed documented beta blocker date and time   History of Anesthesia Complications (+) Family history of anesthesia reaction and history of anesthetic complications  Airway Mallampati: II  TM Distance: >3 FB Neck ROM: Full    Dental  (+) Dental Advisory Given, Edentulous Upper, Missing   Pulmonary neg pulmonary ROS,    Pulmonary exam normal breath sounds clear to auscultation       Cardiovascular Exercise Tolerance: Good hypertension, Pt. on medications and Pt. on home beta blockers Normal cardiovascular exam Rhythm:Regular Rate:Normal     Neuro/Psych negative neurological ROS  negative psych ROS   GI/Hepatic negative GI ROS, Neg liver ROS,   Endo/Other  negative endocrine ROS  Renal/GU negative Renal ROS  negative genitourinary   Musculoskeletal  (+) Arthritis ,   Abdominal   Peds  Hematology negative hematology ROS (+)   Anesthesia Other Findings   Reproductive/Obstetrics negative OB ROS                            Anesthesia Physical Anesthesia Plan  ASA: II  Anesthesia Plan: General   Post-op Pain Management:    Induction: Intravenous  PONV Risk Score and Plan: Ondansetron, Dexamethasone and Midazolam  Airway Management Planned: Oral ETT  Additional Equipment:   Intra-op Plan:   Post-operative Plan: Extubation in OR  Informed Consent: I have reviewed the patients History and Physical, chart, labs and discussed the procedure including the risks, benefits and alternatives for the proposed anesthesia with the patient or authorized representative who has indicated his/her understanding and acceptance.     Dental advisory given  Plan Discussed with: CRNA and Surgeon  Anesthesia Plan  Comments:        Anesthesia Quick Evaluation

## 2019-12-25 NOTE — Interval H&P Note (Signed)
History and Physical Interval Note:  12/25/2019 8:21 AM  Catherine Carney  has presented today for surgery, with the diagnosis of Colon Polyps.  The various methods of treatment have been discussed with the patient and family. After consideration of risks, benefits and other options for treatment, the patient has consented to  Procedure(s): PARTIAL COLECTOMY (N/A) as a surgical intervention.  The patient's history has been reviewed, patient examined, no change in status, stable for surgery.  I have reviewed the patient's chart and labs.  Questions were answered to the patient's satisfaction.     Aviva Signs

## 2019-12-25 NOTE — Op Note (Signed)
Patient:  Catherine Carney  DOB:  [birth year]  MRN:  [id number]   Preop Diagnosis: Multiple colon polyps, ascending colon  Postop Diagnosis: Same  Procedure: Right hemicolectomy  Surgeon: Aviva Signs, MD  Assistant: Curlene Labrum, MD  Anes: General endotracheal  Indications: Patient is a 69 year old white female who was found on colonoscopy to have multiple ascending colon polyps.  One particular one in the cecum could not be fully excised.  The patient now comes to the operating room for right hemicolectomy.  The risks and benefits of the procedure including bleeding, infection, cardiopulmonary difficulties, anastomotic leak, and the possibility of recurrence a blood transfusion were fully explained to the patient, who gave informed consent.  Procedure note: The patient was placed in supine position.  After induction of general endotracheal anesthesia, the abdomen was prepped and draped using the usual sterile technique with ChloraPrep.  Surgical site confirmation was performed.  Midline incision was made from above the umbilicus to below the umbilicus.  The peritoneal cavity was entered into without difficulty.  There are multiple adhesions of omentum to the small bowel and in the right upper quadrant from her previous surgeries.  These were lysed using scissors, Bovie electrocautery, and the LigaSure.  The right colon was then mobilized along its peritoneal reflection.  The hepatic flexure was taken down using the LigaSure.  There were multiple adhesions around the terminal ileum.  These were lysed and a GIA-75 stapler was placed just proximal to the terminal ileum and fired.  This was likewise done across the proximal transverse colon.  The middle colic artery was away from this region.  The right colon mesentery was then divided at its base using a LigaSure.  It was sent to pathology for further examination.  A side to side ileocolic anastomosis was performed using a GIA-75 stapler.  The  enterotomy was closed using a TA 60 stapler.  The staple line was bolstered using 3-0 silk Lembert sutures.  Overlying omentum was brought across the anastomosis and secured using 3-0 silk sutures.  The mesenteric defect was closed using 3-0 silk sutures.  The bowel was then returned into the abdominal cavity in an orderly fashion.  The abdomen was copiously irrigated with normal saline.  All fluid was evacuated from the abdominal cavity and the bowel was returned into the abdominal cavity in an orderly fashion.  All operating personnel changed her gown and gloves.  A new set up was used for closure.  The fascia was reapproximated using a looped 0 Novafil running suture.  The subcutaneous layer was irrigated with normal saline.  Exparel was instilled into the surrounding wound.  The skin was closed using staples.  Betadine ointment and dry sterile dressings were applied.  All tape and needle counts were correct at the end of the procedure.  The patient was extubated in the operating room and transferred to PACU in stable condition.  Dr. Constance Haw assisted me throughout the procedure.  Complications: None  EBL: 100 cc  Specimen: Right colon

## 2019-12-25 NOTE — Anesthesia Postprocedure Evaluation (Signed)
Anesthesia Post Note  Patient: Catherine Carney  Procedure(s) Performed: PARTIAL COLECTOMY (N/A Abdomen)  Patient location during evaluation: PACU Anesthesia Type: General Level of consciousness: awake and alert Pain management: pain level controlled Vital Signs Assessment: post-procedure vital signs reviewed and stable Respiratory status: spontaneous breathing Cardiovascular status: stable Postop Assessment: no apparent nausea or vomiting Anesthetic complications: no   No complications documented.   Last Vitals:  Vitals:   12/25/19 1130 12/25/19 1145  BP: 125/73 133/70  Pulse: 63 65  Resp: (!) 9 (!) 5  Temp:    SpO2: 95% 96%    Last Pain:  Vitals:   12/25/19 1145  TempSrc:   PainSc: [name]

## 2019-12-25 NOTE — Transfer of Care (Signed)
Immediate Anesthesia Transfer of Care Note  Patient: Catherine Carney  Procedure(s) Performed: PARTIAL COLECTOMY (N/A Abdomen)  Patient Location: PACU  Anesthesia Type:General  Level of Consciousness: awake and alert   Airway & Oxygen Therapy: Patient Spontanous Breathing and Patient connected to nasal cannula oxygen  Post-op Assessment: Report given to RN and Post -op Vital signs reviewed and stable  Post vital signs: Reviewed and stable  Last Vitals:  Vitals Value Taken Time  BP 113/68 12/25/19 1048  Temp    Pulse 65 12/25/19 1050  Resp 10 12/25/19 1050  SpO2 94 % 12/25/19 1050  Vitals shown include unvalidated device data.  Last Pain:  Vitals:   12/25/19 0751  TempSrc: (P) Oral  PainSc: (P) 3       Patients Stated Pain Goal: (P) 5 (33/00/76 2263)  Complications: No complications documented.

## 2019-12-26 ENCOUNTER — Encounter (HOSPITAL_COMMUNITY): Payer: Self-pay | Admitting: General Surgery

## 2019-12-26 LAB — BASIC METABOLIC PANEL
Anion gap: 8 (ref 5–15)
BUN: 14 mg/dL (ref 8–23)
CO2: 23 mmol/L (ref 22–32)
Calcium: 8.4 mg/dL — ABNORMAL LOW (ref 8.9–10.3)
Chloride: 107 mmol/L (ref 98–111)
Creatinine, Ser: 0.92 mg/dL (ref 0.44–1.00)
GFR, Estimated: >60 mL/min (ref >60)
Glucose, Bld: 104 mg/dL — ABNORMAL HIGH (ref 70–99)
Potassium: 3.9 mmol/L (ref 3.5–5.1)
Sodium: 138 mmol/L (ref 135–145)

## 2019-12-26 LAB — PHOSPHORUS: Phosphorus: 2.9 mg/dL (ref 2.5–4.6)

## 2019-12-26 LAB — CBC
HCT: 37.3 % (ref 36.0–46.0)
Hemoglobin: 11.7 g/dL — ABNORMAL LOW (ref 12.0–15.0)
MCH: 30 pg (ref 26.0–34.0)
MCHC: 31.4 g/dL (ref 30.0–36.0)
MCV: 95.6 fL (ref 80.0–100.0)
Platelets: 352 10*3/uL (ref 150–400)
RBC: 3.9 MIL/uL (ref 3.87–5.11)
RDW: 13.3 % (ref 11.5–15.5)
WBC: 12.7 10*3/uL — ABNORMAL HIGH (ref 4.0–10.5)
nRBC: 0 % (ref 0.0–0.2)

## 2019-12-26 LAB — MAGNESIUM: Magnesium: 1.8 mg/dL (ref 1.7–2.4)

## 2019-12-26 MED ORDER — INFLUENZA VAC A&B SA ADJ QUAD 0.5 ML IM PRSY: 0.5000 mL | PREFILLED_SYRINGE | INTRAMUSCULAR | Status: DC

## 2019-12-26 MED ORDER — CHLORHEXIDINE GLUCONATE CLOTH 2 % EX PADS
6.0000 | MEDICATED_PAD | Freq: Every day | CUTANEOUS | Status: DC
  Administered 2019-12-26 – 2019-12-28 (×3): 6 via TOPICAL

## 2019-12-26 MED ORDER — KETOROLAC TROMETHAMINE 15 MG/ML IJ SOLN
INTRAMUSCULAR | Status: AC
  Filled 2019-12-26: qty 1

## 2019-12-26 NOTE — Progress Notes (Signed)
1 Day Post-Op  Subjective: Sitting in chair.  No abdominal pain.  Has had 2 bowel movements.  Tolerating clear liquids.  Objective: Vital signs in last 24 hours: Temp:  [97.9 F (36.6 C)-99.3 F (37.4 C)] 99.2 F (37.3 C) (11/16 0604) Pulse Rate:  [61-82] 79 (11/16 0832) Resp:  [5-20] 20 (11/16 0604) BP: (99-159)/(58-80) 159/77 (11/16 0832) SpO2:  [93 %-99 %] 94 % (11/16 0604) Weight:  [91.7 kg] 91.7 kg (11/15 1457)    Intake/Output from previous day: 11/15 0701 - 11/16 0700 In: 3310.9 [P.O.:600; I.V.:2480.9; IV Piggyback:100] Out: 730 [Urine:630; Blood:100] Intake/Output this shift: Total I/O In: -  Out: 100 [Urine:100]  General appearance: alert, cooperative and no distress Resp: clear to auscultation bilaterally Cardio: regular rate and rhythm, S1, S2 normal, no murmur, click, rub or gallop GI: Soft, incision healing well.  Lab Results:  Recent Labs    12/26/19 0551  WBC 12.7*  HGB 11.7*  HCT 37.3  PLT 352   BMET Recent Labs    12/26/19 0551  NA 138  K 3.9  CL 107  CO2 23  GLUCOSE 104*  BUN 14  CREATININE 0.92  CALCIUM 8.4*   PT/INR No results for input(s): LABPROT, INR in the last 72 hours.  Studies/Results: No results found.  Anti-infectives: Anti-infectives (From admission, onward)   Start     Dose/Rate Route Frequency Ordered Stop   12/25/19 0715  cefoTEtan (CEFOTAN) 2 g in sodium chloride 0.9 % 100 mL IVPB        2 g 200 mL/hr over 30 Minutes Intravenous On call to O.R. 12/25/19 0709 12/25/19 0925      Assessment/Plan: s/p Procedure(s): PARTIAL COLECTOMY Impression: Stable on postoperative day 1.  Bowel function has already started to return.  Will advance to full liquid diet.  Foley has been removed.  Check labs in a.m.  LOS: 1 day    Aviva Signs 12/26/2019

## 2019-12-27 LAB — BASIC METABOLIC PANEL
Anion gap: 6 (ref 5–15)
BUN: 12 mg/dL (ref 8–23)
CO2: 24 mmol/L (ref 22–32)
Calcium: 8.1 mg/dL — ABNORMAL LOW (ref 8.9–10.3)
Chloride: 109 mmol/L (ref 98–111)
Creatinine, Ser: 0.78 mg/dL (ref 0.44–1.00)
GFR, Estimated: >60 mL/min (ref >60)
Glucose, Bld: 99 mg/dL (ref 70–99)
Potassium: 4 mmol/L (ref 3.5–5.1)
Sodium: 139 mmol/L (ref 135–145)

## 2019-12-27 LAB — CBC
HCT: 29.6 % — ABNORMAL LOW (ref 36.0–46.0)
Hemoglobin: 9.4 g/dL — ABNORMAL LOW (ref 12.0–15.0)
MCH: 30.5 pg (ref 26.0–34.0)
MCHC: 31.8 g/dL (ref 30.0–36.0)
MCV: 96.1 fL (ref 80.0–100.0)
Platelets: 260 10*3/uL (ref 150–400)
RBC: 3.08 MIL/uL — ABNORMAL LOW (ref 3.87–5.11)
RDW: 13.2 % (ref 11.5–15.5)
WBC: 8.8 10*3/uL (ref 4.0–10.5)
nRBC: 0 % (ref 0.0–0.2)

## 2019-12-27 LAB — PHOSPHORUS: Phosphorus: 2.1 mg/dL — ABNORMAL LOW (ref 2.5–4.6)

## 2019-12-27 LAB — SURGICAL PATHOLOGY

## 2019-12-27 LAB — MAGNESIUM: Magnesium: 1.7 mg/dL (ref 1.7–2.4)

## 2019-12-27 MED ORDER — K PHOS MONO-SOD PHOS DI & MONO 155-852-130 MG PO TABS
250.0000 mg | ORAL_TABLET | Freq: Two times a day (BID) | ORAL | Status: DC
  Administered 2019-12-27 – 2019-12-28 (×3): 250 mg via ORAL
  Filled 2019-12-27 (×2): qty 1

## 2019-12-27 NOTE — Progress Notes (Signed)
2 Days Post-Op  Subjective: Patient has had multiple bowel movements.  Some contain blood.  Patient denies any nausea or vomiting.  Objective: Vital signs in last 24 hours: Temp:  [98.6 F (37 C)-98.9 F (37.2 C)] 98.9 F (37.2 C) (11/16 1945) Pulse Rate:  [72-79] 79 (11/16 1945) Resp:  [19-20] 19 (11/16 1945) BP: (142-148)/(66-70) 148/70 (11/16 1945) SpO2:  [94 %-96 %] 95 % (11/16 2205) Last BM Date: 12/26/19  Intake/Output from previous day: 11/16 0701 - 11/17 0700 In: 2090.1 [P.O.:720; I.V.:1370.1] Out: 350 [Urine:350] Intake/Output this shift: No intake/output data recorded.  General appearance: alert, cooperative and no distress Resp: clear to auscultation bilaterally Cardio: regular rate and rhythm, S1, S2 normal, no murmur, click, rub or gallop GI: Soft, incision healing well.  Lab Results:  Recent Labs    12/26/19 0551 12/27/19 0607  WBC 12.7* 8.8  HGB 11.7* 9.4*  HCT 37.3 29.6*  PLT 352 260   BMET Recent Labs    12/26/19 0551 12/27/19 0607  NA 138 139  K 3.9 4.0  CL 107 109  CO2 23 24  GLUCOSE 104* 99  BUN 14 12  CREATININE 0.92 0.78  CALCIUM 8.4* 8.1*   PT/INR No results for input(s): LABPROT, INR in the last 72 hours.  Studies/Results: No results found.  Anti-infectives: Anti-infectives (From admission, onward)   Start     Dose/Rate Route Frequency Ordered Stop   12/25/19 0715  cefoTEtan (CEFOTAN) 2 g in sodium chloride 0.9 % 100 mL IVPB        2 g 200 mL/hr over 30 Minutes Intravenous On call to O.R. 12/25/19 0709 12/25/19 0925      Assessment/Plan: s/p Procedure(s): PARTIAL COLECTOMY Impression: Stable on postoperative day 2.  Bowel function has returned.  Blood in stools not unexpected.  Mild drop in hemoglobin, but not enough to require blood transfusion.  Mild hypophosphatemia noted.  This will be addressed.  Will advance to heart healthy diet.  Repeat labs in a.m.  LOS: 2 days    Aviva Signs 12/27/2019

## 2019-12-28 LAB — CBC
HCT: 29.3 % — ABNORMAL LOW (ref 36.0–46.0)
Hemoglobin: 9 g/dL — ABNORMAL LOW (ref 12.0–15.0)
MCH: 29.1 pg (ref 26.0–34.0)
MCHC: 30.7 g/dL (ref 30.0–36.0)
MCV: 94.8 fL (ref 80.0–100.0)
Platelets: 257 10*3/uL (ref 150–400)
RBC: 3.09 MIL/uL — ABNORMAL LOW (ref 3.87–5.11)
RDW: 13 % (ref 11.5–15.5)
WBC: 8 10*3/uL (ref 4.0–10.5)
nRBC: 0 % (ref 0.0–0.2)

## 2019-12-28 LAB — PHOSPHORUS: Phosphorus: 3.6 mg/dL (ref 2.5–4.6)

## 2019-12-28 MED ORDER — TRAMADOL HCL 50 MG PO TABS
50.0000 mg | ORAL_TABLET | Freq: Four times a day (QID) | ORAL | 0 refills | Status: DC | PRN
Start: 2019-12-28 — End: 2020-07-17

## 2019-12-28 NOTE — Discharge Summary (Signed)
Physician Discharge Summary  Patient ID: Catherine Carney MRN: [id number] DOB/AGE: [birth year] 69 y.o.  Admit date: 12/25/2019 Discharge date: 12/28/2019  Admission Diagnoses: Colon polyps  Discharge Diagnoses: Same Active Problems:   Adenomatous polyp of ascending colon   S/P partial colectomy Hypertension  Discharged Condition: good  Hospital Course: Patient is a 68 year old white female who was referred to my care for a right hemicolectomy due to unresectable polyp seen on colonoscopy in the ascending colon.  She underwent a right hemicolectomy on 12/25/2019.  She tolerated the surgery well.  Her postoperative course has been remarkable for only mild hypophosphatemia which was treated and has resolved.  Final pathology reveals multiple tubular adenomas without high-grade dysplasia or carcinoma.  One sessile polyp had a focal low-grade cytologic dysplasia.  Patient's diet was advanced without difficulty once bowel function returned.  She is being discharged home on 12/28/2019 in good and improving condition.  Treatments: surgery: Right hemicolectomy on 12/25/2019  Discharge Exam: Blood pressure 137/67, pulse 77, temperature 98.7 F (37.1 C), temperature source Oral, resp. rate 18, height 5\' 6"  (1.676 m), weight 91.7 kg, SpO2 95 %. General appearance: alert, cooperative and no distress Resp: clear to auscultation bilaterally Cardio: regular rate and rhythm, S1, S2 normal, no murmur, click, rub or gallop GI: Soft, incision healing well.  Bowel sounds active.  Disposition: Home   Allergies as of 12/28/2019   No Known Allergies     Medication List    STOP taking these medications   metroNIDAZOLE 250 MG tablet Commonly known as: Flagyl   neomycin 500 MG tablet Commonly known as: MYCIFRADIN     TAKE these medications   acetaminophen 500 MG tablet Commonly known as: TYLENOL Take 500 mg by mouth every 6 (six) hours as needed (for pain.).   ARTIFICIAL TEAR SOLUTION OP Place 1  drop into both eyes daily as needed (dry eyes).   aspirin EC 81 MG tablet Take 81 mg by mouth daily.   Calcium 600+D 600-800 MG-UNIT Tabs Generic drug: Calcium Carb-Cholecalciferol Take 1 tablet by mouth daily.   CoQ10 100 MG Caps Take 100 mg by mouth daily.   lisinopril 20 MG tablet Commonly known as: ZESTRIL Take 20 mg by mouth daily.   metoprolol succinate 25 MG 24 hr tablet Commonly known as: TOPROL-XL Take 25 mg by mouth daily.   multivitamin with minerals Tabs tablet Take 1 tablet by mouth daily. Women's Multivitamin 50+   Nutritional Supplement Liqd Take 1 Bottle by mouth daily. Immune max otc supplement   nystatin cream Commonly known as: MYCOSTATIN Apply 1 application topically 2 (two) times daily.   omeprazole 40 MG capsule Commonly known as: PRILOSEC Take 1 capsule (40 mg total) by mouth 2 (two) times daily before a meal.   rosuvastatin 20 MG tablet Commonly known as: CRESTOR Take 20 mg by mouth at bedtime.   traMADol 50 MG tablet Commonly known as: Ultram Take 1 tablet (50 mg total) by mouth every 6 (six) hours as needed.   TRIPLE OMEGA-3-6-9 PO Take 1 capsule by mouth daily.   vitamin B-12 500 MCG tablet Commonly known as: CYANOCOBALAMIN Take 500 mcg by mouth daily.       Follow-up Information    Aviva Signs, MD. Schedule an appointment as soon as possible for a visit on 01/02/2020.   Specialty: General Surgery Contact information: [id number] [place] [id number] [phone]               Signed: Aviva Signs 12/28/2019,  7:33 AM

## 2019-12-28 NOTE — Progress Notes (Signed)
IV removed and DC instructions reviewed.  Scripts sent to pharmacy and husband to drive home.  F/U with Dr. Arnoldo Morale on 27th.

## 2019-12-28 NOTE — Discharge Instructions (Signed)
Open Colectomy, Care After This sheet gives you information about how to care for yourself after your procedure. Your health care provider may also give you more specific instructions. If you have problems or questions, contact your health care provider. What can I expect after the procedure? After the procedure, it is common to have:  Pain in your abdomen, especially along your incision.  Tiredness. Your energy level will return to normal over the next several weeks.  Constipation.  Nausea.  Difficulty urinating. Follow these instructions at home: Activity  You may be able to return to most of your normal activities within 1-2 weeks, such as working, walking up stairs, and sexual activity.  Avoid activities that require a lot of energy for 4-6 weeks after surgery, such as running, climbing, and lifting heavy objects. Ask your health care provider what activities are safe for you.  Take rest breaks during the day as needed.  Do not drive for 1-2 weeks or until your health care provider says that it is safe.  Do not drive or use heavy machinery while taking prescription pain medicines.  Do not lift anything that is heavier than 10 lb (4.3 kg) until your health care provider says that it is safe. Incision care   Follow instructions from your health care provider about how to take care of your incision. Make sure you: ? Wash your hands with soap and water before you change your bandage (dressing). If soap and water are not available, use hand sanitizer. ? Change your dressing as told by your health care provider. ? Leave stitches (sutures) or staples in place. These skin closures may need to stay in place for 2 weeks or longer.  Avoid wearing tight clothing around your incision.  Protect your incision area from the sun.  Check your incision area every day for signs of infection. Check for: ? More redness, swelling, or pain. ? More fluid or blood. ? Warmth. ? Pus or a bad  smell. General instructions  Do not take baths, swim, or use a hot tub until your health care provider approves. Ask your health care provider when you may shower.  Take over-the-counter and prescription medicines, including stool softeners, only as told by your health care provider.  Eat a low-fat and low-fiber diet for the first 4 weeks after surgery.  Keep all follow-up visits as told by your health care provider. This is important. Contact a health care provider if:  You have more redness, swelling, or pain around your incision.  You have more fluid or blood coming from your incision.  Your incision feels warm to the touch.  You have pus or a bad smell coming from your incision.  You have a fever or chills.  You do not have a bowel movement 2-3 days after surgery.  You cannot eat or drink for 24 hours or more.  You have persistent nausea and vomiting.  You have abdominal pain that gets worse and does not get better with medicine. Get help right away if:  You have chest pain.  You have shortness of breath.  You have pain or swelling in your legs.  Your incision breaks open after your sutures or staples have been removed.  You have bleeding from the rectum. This information is not intended to replace advice given to you by your health care provider. Make sure you discuss any questions you have with your health care provider. Document Revised: 01/08/2017 Document Reviewed: 10/28/2015 Elsevier Patient Education  2020 [employer]  [employer]  

## 2020-01-01 ENCOUNTER — Encounter (HOSPITAL_COMMUNITY): Payer: Self-pay | Admitting: General Surgery

## 2020-01-02 ENCOUNTER — Other Ambulatory Visit: Payer: Self-pay

## 2020-01-02 ENCOUNTER — Ambulatory Visit (INDEPENDENT_AMBULATORY_CARE_PROVIDER_SITE_OTHER): Payer: Medicare HMO | Admitting: General Surgery

## 2020-01-02 ENCOUNTER — Encounter: Payer: Self-pay | Admitting: General Surgery

## 2020-01-02 VITALS — BP 146/78 | HR 93 | Temp 98.3°F | Resp 14 | Ht 66.0 in | Wt 194.0 lb

## 2020-01-02 DIAGNOSIS — Z09 Encounter for follow-up examination after completed treatment for conditions other than malignant neoplasm: Secondary | ICD-10-CM

## 2020-01-02 NOTE — Progress Notes (Signed)
Subjective:     Catherine Carney  Here for follow-up, status post right hemicolectomy.  Patient doing well.  She has no complaints. Objective:    BP (!) 146/78   Pulse 93   Temp 98.3 F (36.8 C) (Oral)   Resp 14   Ht 5\' 6"  (1.676 m)   Wt 194 lb (88 kg)   SpO2 98%   BMI 31.31 kg/m   General:  alert, cooperative and no distress  Abdomen soft, incision healing well.  Staples removed, Steri-Strips applied. Final pathology consistent with diagnosis.  No malignancy seen.     Assessment:    Doing well postoperatively.    Plan:   Increase activity as able.  Follow-up with Dr. Abbey Chatters as to when you need a follow-up colonoscopy.  Follow-up here as needed.

## 2020-01-06 ENCOUNTER — Encounter (HOSPITAL_COMMUNITY): Payer: Self-pay | Admitting: *Deleted

## 2020-01-06 ENCOUNTER — Emergency Department (HOSPITAL_COMMUNITY): Payer: Medicare HMO

## 2020-01-06 ENCOUNTER — Other Ambulatory Visit: Payer: Self-pay

## 2020-01-06 ENCOUNTER — Inpatient Hospital Stay (HOSPITAL_COMMUNITY)
Admission: EM | Admit: 2020-01-06 | Discharge: 2020-01-09 | DRG: 389 | Disposition: A | Discharge status: Home / Self Care | Payer: Medicare HMO | Attending: General Surgery | Admitting: General Surgery

## 2020-01-06 DIAGNOSIS — K9189 Other postprocedural complications and disorders of digestive system: Secondary | ICD-10-CM | POA: Diagnosis present

## 2020-01-06 DIAGNOSIS — R739 Hyperglycemia, unspecified: Secondary | ICD-10-CM | POA: Diagnosis present

## 2020-01-06 DIAGNOSIS — N179 Acute kidney failure, unspecified: Secondary | ICD-10-CM | POA: Diagnosis present

## 2020-01-06 DIAGNOSIS — Z20822 Contact with and (suspected) exposure to covid-19: Secondary | ICD-10-CM | POA: Diagnosis present

## 2020-01-06 DIAGNOSIS — Z86010 Personal history of colonic polyps: Secondary | ICD-10-CM | POA: Diagnosis not present

## 2020-01-06 DIAGNOSIS — D75839 Thrombocytosis, unspecified: Secondary | ICD-10-CM | POA: Diagnosis present

## 2020-01-06 DIAGNOSIS — Z833 Family history of diabetes mellitus: Secondary | ICD-10-CM

## 2020-01-06 DIAGNOSIS — R112 Nausea with vomiting, unspecified: Secondary | ICD-10-CM | POA: Diagnosis not present

## 2020-01-06 DIAGNOSIS — K567 Ileus, unspecified: Principal | ICD-10-CM | POA: Diagnosis present

## 2020-01-06 DIAGNOSIS — Z79899 Other long term (current) drug therapy: Secondary | ICD-10-CM

## 2020-01-06 DIAGNOSIS — Z6831 Body mass index (BMI) 31.0-31.9, adult: Secondary | ICD-10-CM

## 2020-01-06 DIAGNOSIS — M1990 Unspecified osteoarthritis, unspecified site: Secondary | ICD-10-CM | POA: Diagnosis present

## 2020-01-06 DIAGNOSIS — D72829 Elevated white blood cell count, unspecified: Secondary | ICD-10-CM

## 2020-01-06 DIAGNOSIS — K449 Diaphragmatic hernia without obstruction or gangrene: Secondary | ICD-10-CM | POA: Diagnosis present

## 2020-01-06 DIAGNOSIS — N390 Urinary tract infection, site not specified: Secondary | ICD-10-CM | POA: Diagnosis present

## 2020-01-06 DIAGNOSIS — Z8249 Family history of ischemic heart disease and other diseases of the circulatory system: Secondary | ICD-10-CM | POA: Diagnosis not present

## 2020-01-06 DIAGNOSIS — Z83438 Family history of other disorder of lipoprotein metabolism and other lipidemia: Secondary | ICD-10-CM | POA: Diagnosis not present

## 2020-01-06 DIAGNOSIS — Z7982 Long term (current) use of aspirin: Secondary | ICD-10-CM

## 2020-01-06 DIAGNOSIS — E785 Hyperlipidemia, unspecified: Secondary | ICD-10-CM

## 2020-01-06 DIAGNOSIS — I10 Essential (primary) hypertension: Secondary | ICD-10-CM | POA: Diagnosis present

## 2020-01-06 DIAGNOSIS — H35039 Hypertensive retinopathy, unspecified eye: Secondary | ICD-10-CM | POA: Diagnosis present

## 2020-01-06 DIAGNOSIS — E860 Dehydration: Secondary | ICD-10-CM | POA: Diagnosis present

## 2020-01-06 DIAGNOSIS — Z9049 Acquired absence of other specified parts of digestive tract: Secondary | ICD-10-CM | POA: Diagnosis not present

## 2020-01-06 DIAGNOSIS — E669 Obesity, unspecified: Secondary | ICD-10-CM

## 2020-01-06 LAB — BASIC METABOLIC PANEL
Anion gap: 12 (ref 5–15)
BUN: 25 mg/dL — ABNORMAL HIGH (ref 8–23)
CO2: 24 mmol/L (ref 22–32)
Calcium: 10.3 mg/dL (ref 8.9–10.3)
Chloride: 99 mmol/L (ref 98–111)
Creatinine, Ser: 1.83 mg/dL — ABNORMAL HIGH (ref 0.44–1.00)
GFR, Estimated: 29 mL/min — ABNORMAL LOW (ref >60)
Glucose, Bld: 149 mg/dL — ABNORMAL HIGH (ref 70–99)
Potassium: 3.9 mmol/L (ref 3.5–5.1)
Sodium: 135 mmol/L (ref 135–145)

## 2020-01-06 LAB — CBC WITH DIFFERENTIAL/PLATELET
Abs Immature Granulocytes: 0.11 10*3/uL — ABNORMAL HIGH (ref 0.00–0.07)
Basophils Absolute: 0.1 10*3/uL (ref 0.0–0.1)
Basophils Relative: 0 %
Eosinophils Absolute: 0 10*3/uL (ref 0.0–0.5)
Eosinophils Relative: 0 %
HCT: 41.4 % (ref 36.0–46.0)
Hemoglobin: 13.3 g/dL (ref 12.0–15.0)
Immature Granulocytes: 1 %
Lymphocytes Relative: 14 %
Lymphs Abs: 2.8 10*3/uL (ref 0.7–4.0)
MCH: 29.3 pg (ref 26.0–34.0)
MCHC: 32.1 g/dL (ref 30.0–36.0)
MCV: 91.2 fL (ref 80.0–100.0)
Monocytes Absolute: 1.1 10*3/uL — ABNORMAL HIGH (ref 0.1–1.0)
Monocytes Relative: 5 %
Neutro Abs: 16.3 10*3/uL — ABNORMAL HIGH (ref 1.7–7.7)
Neutrophils Relative %: 80 %
Platelets: 785 10*3/uL — ABNORMAL HIGH (ref 150–400)
RBC: 4.54 MIL/uL (ref 3.87–5.11)
RDW: 13.2 % (ref 11.5–15.5)
WBC: 20.4 10*3/uL — ABNORMAL HIGH (ref 4.0–10.5)
nRBC: 0 % (ref 0.0–0.2)

## 2020-01-06 LAB — RESP PANEL BY RT-PCR (FLU A&B, COVID) ARPGX2
Influenza A by PCR: NEGATIVE
Influenza B by PCR: NEGATIVE
SARS Coronavirus 2 by RT PCR: NEGATIVE

## 2020-01-06 LAB — LIPASE, BLOOD: Lipase: 23 U/L (ref 11–51)

## 2020-01-06 MED ORDER — BOOST / RESOURCE BREEZE PO LIQD CUSTOM: 1.0000 | Freq: Three times a day (TID) | ORAL | Status: DC

## 2020-01-06 MED ORDER — SODIUM CHLORIDE 0.9 % IV SOLN
Freq: Once | INTRAVENOUS | Status: AC
  Administered 2020-01-06: 1000 mL via INTRAVENOUS

## 2020-01-06 MED ORDER — LISINOPRIL 10 MG PO TABS
20.0000 mg | ORAL_TABLET | Freq: Every day | ORAL | Status: DC
  Administered 2020-01-07 – 2020-01-09 (×3): 20 mg via ORAL
  Filled 2020-01-06 (×3): qty 2

## 2020-01-06 MED ORDER — ROSUVASTATIN CALCIUM 20 MG PO TABS
20.0000 mg | ORAL_TABLET | Freq: Every day | ORAL | Status: DC
  Administered 2020-01-07 – 2020-01-08 (×2): 20 mg via ORAL
  Filled 2020-01-06 (×2): qty 1

## 2020-01-06 MED ORDER — PIPERACILLIN-TAZOBACTAM 3.375 G IVPB 30 MIN
3.3750 g | Freq: Once | INTRAVENOUS | Status: AC
  Administered 2020-01-06: 3.375 g via INTRAVENOUS
  Filled 2020-01-06: qty 50

## 2020-01-06 MED ORDER — SODIUM CHLORIDE 0.9 % IV SOLN: Freq: Once | INTRAVENOUS | Status: AC

## 2020-01-06 MED ORDER — IOHEXOL 9 MG/ML PO SOLN
ORAL | Status: AC
  Filled 2020-01-06: qty 1000

## 2020-01-06 MED ORDER — ONDANSETRON HCL 4 MG/2ML IJ SOLN
4.0000 mg | Freq: Four times a day (QID) | INTRAMUSCULAR | Status: DC | PRN
  Administered 2020-01-07: 4 mg via INTRAVENOUS
  Filled 2020-01-06: qty 2

## 2020-01-06 MED ORDER — HEPARIN SODIUM (PORCINE) 5000 UNIT/ML IJ SOLN
5000.0000 [IU] | Freq: Three times a day (TID) | INTRAMUSCULAR | Status: DC
  Administered 2020-01-06 – 2020-01-09 (×8): 5000 [IU] via SUBCUTANEOUS
  Filled 2020-01-06 (×8): qty 1

## 2020-01-06 MED ORDER — PANTOPRAZOLE SODIUM 40 MG PO TBEC
40.0000 mg | DELAYED_RELEASE_TABLET | Freq: Every day | ORAL | Status: DC
  Administered 2020-01-07 – 2020-01-09 (×3): 40 mg via ORAL
  Filled 2020-01-06 (×3): qty 1

## 2020-01-06 MED ORDER — ONDANSETRON HCL 4 MG/2ML IJ SOLN
4.0000 mg | Freq: Once | INTRAMUSCULAR | Status: AC
  Administered 2020-01-06: 4 mg via INTRAVENOUS
  Filled 2020-01-06: qty 2

## 2020-01-06 MED ORDER — PIPERACILLIN-TAZOBACTAM IN DEX 2-0.25 GM/50ML IV SOLN
2.2500 g | Freq: Once | INTRAVENOUS | Status: DC
  Filled 2020-01-06: qty 50

## 2020-01-06 MED ORDER — METOPROLOL SUCCINATE ER 25 MG PO TB24
25.0000 mg | ORAL_TABLET | Freq: Every day | ORAL | Status: DC
  Administered 2020-01-07 – 2020-01-09 (×3): 25 mg via ORAL
  Filled 2020-01-06 (×3): qty 1

## 2020-01-06 NOTE — ED Triage Notes (Addendum)
Pt with N/V since yesterday.  LBM small this morning and stools were loose per pt.  Pt had a a partial colectomy on 11/15.

## 2020-01-06 NOTE — ED Notes (Signed)
PT report called to [facility], verbalized complete understanding of Pt plan of care and current condition, denies questions at this time. Pt resting on stretcher rails up x 2 skin warm dry intact. Pt VSS NAD PT denies needs or pain at this time, remains a/o x 4, calm cooperative with staff. Pt IV healthy with clean dry dressing. Pt is hemodynamically stable and ready for transport to clean ready room, via stretcher.

## 2020-01-06 NOTE — ED Provider Notes (Signed)
This patient is a 69 year old female presenting a couple of weeks after having a partial colectomy secondary to having colonic polyps, she has done well postoperatively and 2 days ago on Thanksgiving she was able to eat a large Thanksgiving meal however the next day, yesterday she started to develop nausea vomiting and some abdominal discomfort.  She is having some frequent loose stools and on my exam she has a soft minimally tender abdomen, it is not distended, it is not tympanic to percussion, she has clear heart and lung sounds though she does have a mild tachycardia.  Pulses are intact, no edema, moist mucous membranes.  Proceed with labs and CT scan, patient agreeable.  Seems less likely to be C. Difficile.  Medical screening examination/treatment/procedure(s) were conducted as a shared visit with non-physician practitioner(s) and myself.  I personally evaluated the patient during the encounter.  Clinical Impression:   Final diagnoses:  AKI (acute kidney injury) ([place])  Postoperative ileus ([place])  Leukocytosis, unspecified type         Noemi Chapel, MD 01/07/20 1454

## 2020-01-06 NOTE — ED Provider Notes (Signed)
[facility] EMERGENCY DEPARTMENT Provider Note   CSN: [id number] Arrival date & time: 01/06/20  1618     History Chief Complaint  Patient presents with  . Post-op Problem    Catherine Carney is a 69 y.o. female with a history of hypertension, recent partial colectomy, tubal ligation, and cholecystectomy.  Pt underwent partial colectomy on December 25 2019 performed by  Dr. Aviva Signs.  She had follow-up examination on 11/23.  Patient states that she was doing well without any complications from her surgery until Friday morning following eating a large meal on Thanksgiving.  Patient presents with with a chief complaint of nausea, vomiting, and small loose bowel movements.  Patient states her nausea and vomiting started Friday morning, it is intermittent, emesis contains stomach contents without any bright red blood, feculent material, or coffee-ground.  Patient endorses vomiting 4 times in the last 24 hours.  Patient has been able to tolerate minimal amounts of p.o. liquids and solids.  Patient states her last normal bowel movement occurred on Thursday; since then she has had small amounts of very loose stool.  Patient endorsed left-sided abdominal pain when trying to have a bowel movement earlier today.  He denies any abdominal pain at present.  Patient reports she has not passed any flatus the past 2 days.  Patient endorses slight distention to her abdomen.  Patient denies any erythema or purulent discharge from her incision site.  Patient denies any fevers or chills.         Past Medical History:  Diagnosis Date  . Cataract    Combined form OU  . Family history of adverse reaction to anesthesia    sister had PONV  . Female bladder prolapse   . Hyperlipidemia   . Hypertension   . Hypertensive retinopathy    OU  . Macular hole of right eye   . Osteoarthritis     Patient Active Problem List   Diagnosis Date Noted  . Nausea & vomiting 01/06/2020  . Ileus following  gastrointestinal surgery ([place]) 01/06/2020  . Leukocytosis 01/06/2020  . Thrombocytosis 01/06/2020  . AKI (acute kidney injury) ([place]) 01/06/2020  . Obese 01/06/2020  . Essential hypertension 01/06/2020  . Hyperlipidemia 01/06/2020  . Hiatal hernia 01/06/2020  . Dehydration 01/06/2020  . Hyperglycemia 01/06/2020  . S/P partial colectomy 12/25/2019  . Adenomatous polyp of ascending colon   . Dysphagia 11/15/2019  . Indigestion 11/15/2019  . Family history of colon cancer 11/15/2019  . Pessary maintenance, Milex ring with support #6 04/06/2019    Past Surgical History:  Procedure Laterality Date  . [place] VITRECTOMY WITH 20 GAUGE MVR PORT FOR MACULAR HOLE Right 04/20/2019   Procedure: 25 GAUGE PARS PLANA VITRECTOMY WITH 20 GAUGE MVR PORT FOR MACULAR HOLE WITH MEMBRANE PEEL;  Surgeon: Bernarda Caffey, MD;  Location: [place];  Service: Ophthalmology;  Laterality: Right;  . BIOPSY  11/17/2019   Procedure: BIOPSY;  Surgeon: [name], DO;  [facility];  Service: Endoscopy;;  . CATARACT EXTRACTION Right   . CHOLECYSTECTOMY    . COLONOSCOPY WITH PROPOFOL N/A 11/17/2019   Procedure: COLONOSCOPY WITH PROPOFOL;  Surgeon: [name], DO;  [facility];  Service: Endoscopy;  Laterality: N/A;  11:15am  . ESOPHAGOGASTRODUODENOSCOPY (EGD) WITH PROPOFOL N/A 11/17/2019   Procedure: ESOPHAGOGASTRODUODENOSCOPY (EGD) WITH PROPOFOL;  Surgeon: [name], DO;  [facility];  Service: Endoscopy;  Laterality: N/A;  . EYE SURGERY Right 04/20/2019  [place] repair - PPV/MP - Dr. Bernarda Caffey  . GALLBLADDER SURGERY    . GAS/FLUID EXCHANGE Right 04/20/2019   Procedure: Gas/Fluid Exchange;  Surgeon: Bernarda Caffey, MD;  Location: [place];  Service: Ophthalmology;  Laterality: Right;  . HEMOSTASIS CLIP PLACEMENT  11/17/2019   Procedure: HEMOSTASIS CLIP PLACEMENT;  Surgeon: [name], DO;  [facility];  Service: Endoscopy;;  . PARTIAL  COLECTOMY N/A 12/25/2019   Procedure: PARTIAL COLECTOMY;  Surgeon: Aviva Signs, MD;  Location: [facility];  Service: General;  Laterality: N/A;  . PHOTOCOAGULATION WITH LASER Right 04/20/2019   Procedure: Photocoagulation With Laser;  Surgeon: Bernarda Caffey, MD;  Location: [place];  Service: Ophthalmology;  Laterality: Right;  . POLYPECTOMY  11/17/2019   Procedure: POLYPECTOMY;  Surgeon: [name], DO;  [facility];  Service: Endoscopy;;  . TUBAL LIGATION       OB History    Gravida  3   Para  3   Term  3   Preterm      AB      Living  3     SAB      TAB      Ectopic      Multiple      Live Births  3           Family History  Problem Relation Age of Onset  . Heart attack Maternal Grandmother   . Cancer Maternal Grandfather   . Cancer Father        lung  . Other Mother        irregular heart beat  . Hypertension Mother   . Diabetes Brother   . Hypertension Brother   . High Cholesterol Brother   . Atrial fibrillation Brother   . Melanoma Brother        right ear  . Hypertension Sister   . High Cholesterol Sister   . Colon cancer Sister   . Rheum arthritis Sister   . Other Sister        irregular heart beat  . Hypertension Son   . Cancer Other        maternal side  . Stroke Other        maternal side    Social History   Tobacco Use  . Smoking status: Never Smoker  . Smokeless tobacco: Never Used  Vaping Use  . Vaping Use: Never used  Substance Use Topics  . Alcohol use: No    Alcohol/week: 0.0 standard drinks  . Drug use: No    Home Medications Prior to Admission medications   Medication Sig Start Date End Date Taking? Authorizing Provider  acetaminophen (TYLENOL) 500 MG tablet Take 500 mg by mouth every 6 (six) hours as needed (for pain.).    Yes [provider]  ARTIFICIAL TEAR SOLUTION OP Place 1 drop into both eyes daily as needed (dry eyes).   Yes [provider]  aspirin EC 81 MG tablet Take 81 mg  by mouth daily.   Yes [provider]  Calcium Carb-Cholecalciferol (CALCIUM 600+D) 600-800 MG-UNIT TABS Take 1 tablet by mouth daily.   Yes [provider]  Coenzyme Q10 (COQ10) 100 MG CAPS Take 100 mg by mouth daily.   Yes [provider]  lisinopril (ZESTRIL) 20 MG tablet Take 20 mg by mouth daily.   Yes [provider]  metoprolol succinate (TOPROL-XL) 25 MG 24 hr tablet Take 25 mg by mouth daily.  11/14/19  Yes [provider]  Multiple Vitamin (MULTIVITAMIN WITH MINERALS) TABS tablet Take 1 tablet by mouth daily. Women's Multivitamin 50+   Yes [provider]  Nutritional Supplement LIQD Take 1 Bottle by mouth daily. Immune max otc supplement   Yes [provider]  nystatin cream (MYCOSTATIN) Apply 1 application topically 2 (two) times daily.   Yes [provider]  Omega 3-6-9 Fatty Acids (TRIPLE OMEGA-3-6-9 PO) Take 1 capsule by mouth daily.   Yes [provider]  omeprazole (PRILOSEC) 40 MG capsule Take 1 capsule (40 mg total) by mouth 2 (two) times daily before a meal. 11/17/19 05/15/20 Yes Carver, Charles K, DO  rosuvastatin (CRESTOR) 20 MG tablet Take 20 mg by mouth at bedtime. 10/02/19  Yes [provider]  traMADol (ULTRAM) 50 MG tablet Take 1 tablet (50 mg total) by mouth every 6 (six) hours as needed. 12/28/19  Yes Aviva Signs, MD  vitamin B-12 (CYANOCOBALAMIN) 500 MCG tablet Take 500 mcg by mouth daily.   Yes [provider]    Allergies    Patient has no known allergies.  Review of Systems   Review of Systems  Constitutional: Positive for fatigue. Negative for chills and fever.  Eyes: Negative for visual disturbance.  Respiratory: Negative for shortness of breath.   Cardiovascular: Negative for chest pain and palpitations.  Gastrointestinal: Positive for abdominal distention, abdominal pain (left lower quadrant when straining), nausea and vomiting. Negative for blood in stool,  constipation and rectal pain.  Genitourinary: Positive for dysuria. Negative for difficulty urinating, flank pain, hematuria, vaginal bleeding, vaginal discharge and vaginal pain.  Musculoskeletal: Negative for back pain and neck pain.  Skin: Negative for color change and rash.  Neurological: Negative for dizziness, syncope, light-headedness and headaches.  Psychiatric/Behavioral: Negative for confusion.    Physical Exam Updated Vital Signs BP 123/72 (BP Location: Left Arm)   Pulse 92   Temp 98.4 F (36.9 C)   Resp 20   Ht 5\' 6"  (1.676 m)   Wt 88.5 kg   SpO2 99%   BMI 31.47 kg/m   Physical Exam Constitutional:      General: She is not in acute distress.    Appearance: She is ill-appearing. She is not toxic-appearing.  HENT:     Head: Normocephalic.  Cardiovascular:     Rate and Rhythm: Normal rate and regular rhythm.     Heart sounds: Normal heart sounds.  Pulmonary:     Effort: Pulmonary effort is normal.     Breath sounds: Normal breath sounds.  Abdominal:     General: A surgical scar is present. Bowel sounds are normal. There is no distension.     Tenderness: There is abdominal tenderness in the right lower quadrant and left lower quadrant. There is no rebound.     Hernia: No hernia is present.     Comments: No tympany to percussion.  Surgical incision located midline under the umbilicus steri strips in place; clean, dry, and intact.    Skin:    General: Skin is warm and dry.  Neurological:     General: No focal deficit present.     Mental Status: She is alert.  Psychiatric:        Behavior: Behavior is cooperative.     ED Results / Procedures / Treatments   Labs (all labs ordered are listed, but only abnormal results are displayed) Labs Reviewed  CBC WITH DIFFERENTIAL/PLATELET - Abnormal; Notable for the following components:      Result  Value   WBC 20.4 (*)    Platelets 785 (*)    Neutro Abs 16.3 (*)    Monocytes Absolute 1.1 (*)    Abs Immature  Granulocytes 0.11 (*)    All other components within normal limits  BASIC METABOLIC PANEL - Abnormal; Notable for the following components:   Glucose, Bld 149 (*)    BUN 25 (*)    Creatinine, Ser 1.83 (*)    GFR, Estimated 29 (*)    All other components within normal limits  RESP PANEL BY RT-PCR (FLU A&B, COVID) ARPGX2  LIPASE, BLOOD  COMPREHENSIVE METABOLIC PANEL  CBC  PROTIME-INR  APTT  MAGNESIUM  PHOSPHORUS    EKG None  Radiology CT ABDOMEN PELVIS WO CONTRAST  Result Date: 01/06/2020 CLINICAL DATA:  Nausea and vomiting. EXAM: CT ABDOMEN AND PELVIS WITHOUT CONTRAST TECHNIQUE: Multidetector CT imaging of the abdomen and pelvis was performed following the standard protocol without IV contrast. COMPARISON:  November 17, 2019 FINDINGS: Lower chest: Mild atelectasis is seen within the posterior aspect of the bilateral lung bases. Hepatobiliary: No focal liver abnormality is seen. Status post cholecystectomy. No biliary dilatation. 3.2 cm x 2.1 cm, 1.2 cm x 0.9 cm and 1.4 cm x 1.3 cm areas of heterogeneous mildly increased attenuation are seen adjacent to the medial aspect of the right lobe of the liver. This is just below the level of the gallbladder fossa and just above an area of surgically anastomosed large bowel. This is new finding when compared to the prior study. Pancreas: Unremarkable. No pancreatic ductal dilatation or surrounding inflammatory changes. Spleen: Normal in size without focal abnormality. A mild to moderate amount of perisplenic fluid is seen. Adrenals/Urinary Tract: The right adrenal gland is normal in appearance. A 2.6 cm x 2.0 cm isodense left adrenal mass is seen. Kidneys are normal in size, without renal calculi or hydronephrosis. Stable parapelvic renal cysts are seen on the left. Bladder is unremarkable. Stomach/Bowel: There is a small hiatal hernia. Surgically anastomosed bowel is seen along the mid to distal ascending colon. Mildly dilated small bowel loops are seen  within the left abdomen. A clear transition zone is not identified. Vascular/Lymphatic: No significant vascular findings are present. No enlarged abdominal or pelvic lymph nodes. Reproductive: Uterus and bilateral adnexa are unremarkable. Other: No abdominal wall hernia or abnormality. Mild to moderate severity mesenteric inflammatory fat stranding is seen throughout the right abdomen. A mild amount of pelvic free fluid is noted. Musculoskeletal: Degenerative changes seen throughout the lumbar spine. IMPRESSION: 1. Findings consistent with a small bowel obstruction versus postoperative ileus. 2. Mild to moderate severity inflammatory process within right abdomen which may be, in part, secondary to the patient's recent surgical intervention. 3. Areas of heterogeneous attenuation within the right upper quadrant, in between the gallbladder fossa and hepatic flexure, likely postoperative in nature. Small hematomas cannot be excluded. Correlation with follow-up abdomen pelvis CT is recommended to determine stability. 4. Stable 2.6 cm x 2.0 cm isodense left adrenal mass which may represent an adrenal adenoma. 5. Small hiatal hernia. 6. Mild to moderate amount of perisplenic and pelvic free fluid. 7. Aortic atherosclerosis. Aortic Atherosclerosis (ICD10-I70.0). Electronically Signed   By: Virgina Norfolk M.D.   On: 01/06/2020 19:07    Procedures Procedures (including critical care time)  Medications Ordered in ED Medications  iohexol (OMNIPAQUE) 9 MG/ML oral solution (has no administration in time range)  heparin injection 5,000 Units (5,000 Units Subcutaneous Given 01/06/20 2326)  ondansetron (ZOFRAN) injection 4 mg (  has no administration in time range)  lisinopril (ZESTRIL) tablet 20 mg (has no administration in time range)  metoprolol succinate (TOPROL-XL) 24 hr tablet 25 mg (has no administration in time range)  rosuvastatin (CRESTOR) tablet 20 mg (20 mg Oral Not Given 01/06/20 2326)  pantoprazole  (PROTONIX) EC tablet 40 mg (has no administration in time range)  feeding supplement (BOOST / RESOURCE BREEZE) liquid 1 Container (has no administration in time range)  ondansetron (ZOFRAN) injection 4 mg (4 mg Intravenous Given 01/06/20 1725)  0.9 %  sodium chloride infusion ( Intravenous Stopped 01/06/20 2059)  piperacillin-tazobactam (ZOSYN) IVPB 3.375 g (0 g Intravenous Stopped 01/06/20 2058)  0.9 %  sodium chloride infusion ( Intravenous New Bag/Given 01/06/20 2257)    ED Course  I have reviewed the triage vital signs and the nursing notes.  Pertinent labs & imaging results that were available during my care of the patient were reviewed by me and considered in my medical decision making (see chart for details).    MDM Rules/Calculators/A&P                          She is an alert 69 year old female who is ill-appearing but in no acute distress.  Pt underwent partial colectomy on December 25 2019 performed by  Dr. Aviva Signs.  Patient presents today with chief complaint of nausea, vomiting, and small loose bowel movements.  Patient symptoms began Friday morning after eating a large meal on Thanksgiving.  Vomited 4x in last 24hrs emesis contained stomach contents without any bright red blood, feculent material, or coffee-ground.  Last normal bowel movement occurred on Thursday. Patient endorses slight distention to her abdomen.  Patient denies any erythema or purulent discharge from her incision site.  Patient denies any fevers or chills.  Bowel sounds normal in all 4 quadrants, no tympany to percussion, and is to lower left and right quadrant.  Midline surgical incision below umbilicus, clean dry and intact.   The differential diagnosis includes, but is not limited to gastroenteritis, appendicitis, Bowel obstruction, post op ileus , bowel perforation, and diverticulitis.    Patient was given Zofran and started on maintenance fluids  BMP showed creatinine elevated at 1.83, BUN elevated  at 25.  BC showed leukocytosis 20.4, platelets elevated at 785, elevated 16.3, hematocrit and hemoglobin within normal limits.    CT scan abdomen pelvis showed showed endings consistent with a small bowel obstruction versus postop ileus, there was no clear transition zone identified; Mild to moderate severity inflammatory process within right abdomen which may be, in part, secondary to the patient's recent surgical intervention; small hiatal hernia; Stable 2.6 cm x 2.0 cm isodense left adrenal mass which may represent an adrenal adenoma.  Patient was started on Zosyn.  Patient remained free of nausea after initial dose of Zofran.  Due to no clear transition zone identified, no tympany to percussion,  and lack of nausea NG tube was not placed as postop ileus is more likely.  Hospitalist was consulted for admission for this patient.  Patient was was informed of plan to admit for further observation and was amenable to this.       Final Clinical Impression(s) / ED Diagnoses Final diagnoses:  AKI (acute kidney injury) ([place])  Postoperative ileus ([place])  Leukocytosis, unspecified type    Rx / DC Orders ED Discharge Orders    None       Loni Beckwith, [place] 01/07/20 0044  Noemi Chapel, MD 01/07/20 [phone]

## 2020-01-06 NOTE — H&P (Signed)
History and Physical  Catherine Carney ZMO:[id number] DOB: May 26, 1950 DOA: 01/06/2020  Referring physician: Loni Beckwith, PA-C PCP: Chesley Noon, MD  Patient coming from: Home  Chief Complaint: Nausea, Vomiting  HPI: Catherine Carney is a 69 y.o. female with medical history significant for HTN, HLD, hiatal hernia, recent partial colectomy (12/25/2019) by Dr. Arnoldo Morale who presents to the emergency department due to nausea and vomiting which started yesterday.  Patient has been doing well since she had partial colectomy due to polyps, she had a post surgical follow-up on 11/23.  Patient states that she was doing well until yesterday (11/26) in the morning following eating a large meal on Thanksgiving (11/25).  She complained of nausea and 4 episodes of vomiting which contained nonbloody, nonbilious vomitus and stomach food contents.  This was associated with some loose bowel movements with last bowel movement being at noon today.  She has been unable to tolerate any oral intake including liquids.  She denies fever, chills, abdominal pain, chest pain or shortness of breath.  ED Course: In the emergency department, she was hemodynamically stable.  Work-up in the ED showed leukocytosis, thrombocytosis, BUN/creatinine 25/1.33 (baseline creatinine 0.8-0.9) and hyperglycemia.  CT abdomen and pelvis without contrast showed findings consistent with a small bowel obstruction versus postoperative ileus.  IV hydration was provided, IV Zosyn and IV Zofran were given.  Hospitalist was asked to admit patient for further evaluation and management.  Review of Systems: Constitutional: Negative for chills and fever.  HENT: Negative for ear pain and sore throat.   Eyes: Negative for pain and visual disturbance.  Respiratory: Negative for cough, chest tightness and shortness of breath.   Cardiovascular: Negative for chest pain and palpitations.  Gastrointestinal: Positive for nausea and vomiting.  Negative for  abdominal pain   Endocrine: Negative for polyphagia and polyuria.  Genitourinary: Negative for decreased urine volume, dysuria, enuresis Musculoskeletal: Negative for arthralgias and back pain.  Skin: Negative for color change and rash.  Allergic/Immunologic: Negative for immunocompromised state.  Neurological: Positive for weakness.  Negative for tremors, syncope, speech difficulty, light-headedness and headaches.  Hematological: Does not bruise/bleed easily.  All other systems reviewed and are negative  Past Medical History:  Diagnosis Date  . Cataract    Combined form OU  . Family history of adverse reaction to anesthesia    sister had PONV  . Female bladder prolapse   . Hyperlipidemia   . Hypertension   . Hypertensive retinopathy    OU  . Macular hole of right eye   . Osteoarthritis    Past Surgical History:  Procedure Laterality Date  . [place] VITRECTOMY WITH 20 GAUGE MVR PORT FOR MACULAR HOLE Right 04/20/2019   Procedure: 25 GAUGE PARS PLANA VITRECTOMY WITH 20 GAUGE MVR PORT FOR MACULAR HOLE WITH MEMBRANE PEEL;  Surgeon: Bernarda Caffey, MD;  Location: [facility];  Service: Ophthalmology;  Laterality: Right;  . BIOPSY  11/17/2019   Procedure: BIOPSY;  Surgeon: [name], DO;  [facility];  Service: Endoscopy;;  . CATARACT EXTRACTION Right   . CHOLECYSTECTOMY    . COLONOSCOPY WITH PROPOFOL N/A 11/17/2019   Procedure: COLONOSCOPY WITH PROPOFOL;  Surgeon: [name], DO;  [facility];  Service: Endoscopy;  Laterality: N/A;  11:15am  . ESOPHAGOGASTRODUODENOSCOPY (EGD) WITH PROPOFOL N/A 11/17/2019   Procedure: ESOPHAGOGASTRODUODENOSCOPY (EGD) WITH PROPOFOL;  Surgeon: [name], DO;  [facility];  Service: Endoscopy;  Laterality: N/A;  .  EYE SURGERY Right 04/20/2019   [facility] repair - PPV/MP - Dr. Bernarda Caffey  . GALLBLADDER SURGERY    . GAS/FLUID EXCHANGE Right 04/20/2019   Procedure: Gas/Fluid Exchange;  Surgeon:  Bernarda Caffey, MD;  Location: [facility];  Service: Ophthalmology;  Laterality: Right;  . HEMOSTASIS CLIP PLACEMENT  11/17/2019   Procedure: HEMOSTASIS CLIP PLACEMENT;  Surgeon: [name], DO;  [facility];  Service: Endoscopy;;  . PARTIAL COLECTOMY N/A 12/25/2019   Procedure: PARTIAL COLECTOMY;  Surgeon: Aviva Signs, MD;  Location: [facility];  Service: General;  Laterality: N/A;  . PHOTOCOAGULATION WITH LASER Right 04/20/2019   Procedure: Photocoagulation With Laser;  Surgeon: Bernarda Caffey, MD;  Location: [place];  Service: Ophthalmology;  Laterality: Right;  . POLYPECTOMY  11/17/2019   Procedure: POLYPECTOMY;  Surgeon: [name], DO;  [facility];  Service: Endoscopy;;  . TUBAL LIGATION      Social History:  reports that she has never smoked. She has never used smokeless tobacco. She reports that she does not drink alcohol and does not use drugs.   No Known Allergies  Family History  Problem Relation Age of Onset  . Heart attack Maternal Grandmother   . Cancer Maternal Grandfather   . Cancer Father        lung  . Other Mother        irregular heart beat  . Hypertension Mother   . Diabetes Brother   . Hypertension Brother   . High Cholesterol Brother   . Atrial fibrillation Brother   . Melanoma Brother        right ear  . Hypertension Sister   . High Cholesterol Sister   . Colon cancer Sister   . Rheum arthritis Sister   . Other Sister        irregular heart beat  . Hypertension Son   . Cancer Other        maternal side  . Stroke Other        maternal side    Prior to Admission medications   Medication Sig Start Date End Date Taking? Authorizing Provider  acetaminophen (TYLENOL) 500 MG tablet Take 500 mg by mouth every 6 (six) hours as needed (for pain.).     [provider]  ARTIFICIAL TEAR SOLUTION OP Place 1 drop into both eyes daily as needed (dry eyes).    [provider]  aspirin EC 81 MG tablet Take 81 mg by mouth  daily.    [provider]  Calcium Carb-Cholecalciferol (CALCIUM 600+D) 600-800 MG-UNIT TABS Take 1 tablet by mouth daily.    [provider]  Coenzyme Q10 (COQ10) 100 MG CAPS Take 100 mg by mouth daily.    [provider]  lisinopril (ZESTRIL) 20 MG tablet Take 20 mg by mouth daily.    [provider]  metoprolol succinate (TOPROL-XL) 25 MG 24 hr tablet Take 25 mg by mouth daily.  11/14/19   [provider]  Multiple Vitamin (MULTIVITAMIN WITH MINERALS) TABS tablet Take 1 tablet by mouth daily. Women's Multivitamin 50+    [provider]  Nutritional Supplement LIQD Take 1 Bottle by mouth daily. Immune max otc supplement    [provider]  nystatin cream (MYCOSTATIN) Apply 1 application topically 2 (two) times daily.    [provider]  Omega 3-6-9 Fatty Acids (TRIPLE OMEGA-3-6-9 PO) Take 1 capsule by mouth daily.    [provider]  omeprazole (PRILOSEC) 40 MG capsule  Take 1 capsule (40 mg total) by mouth 2 (two) times daily before a meal. 11/17/19 05/15/20  [name], DO  rosuvastatin (CRESTOR) 20 MG tablet Take 20 mg by mouth at bedtime. 10/02/19   [provider]  traMADol (ULTRAM) 50 MG tablet Take 1 tablet (50 mg total) by mouth every 6 (six) hours as needed. 12/28/19   Aviva Signs, MD  vitamin B-12 (CYANOCOBALAMIN) 500 MCG tablet Take 500 mcg by mouth daily.    [provider]    Physical Exam: BP 122/63 (BP Location: Left Arm)   Pulse 95   Temp 98.4 F (36.9 C) (Oral)   Resp 14   Ht 5\' 6"  (1.676 m)   Wt 88.5 kg   SpO2 97%   BMI 31.47 kg/m   . General: 69 y.o. year-old female well developed well nourished in no acute distress.  Alert and oriented x3. Marland Kitchen HEENT: Dry mucous membrane.  NCAT, EOMI . Neck: Supple, trachea medial . Cardiovascular: Regular rate and rhythm with no rubs or gallops.  No thyromegaly or JVD noted.  No lower extremity edema. 2/4 pulses in all 4  extremities. Marland Kitchen Respiratory: Clear to auscultation with no wheezes or rales. Good inspiratory effort. . Abdomen: Soft nontender nondistended with normal bowel sounds x4 quadrants. . Muskuloskeletal: No cyanosis, clubbing or edema noted bilaterally . Neuro: CN II-XII intact, strength, sensation, reflexes . Skin: No ulcerative lesions noted or rashes . Psychiatry: Judgement and insight appear normal. Mood is appropriate for condition and setting          Labs on Admission:  Basic Metabolic Panel: Recent Labs  Lab 01/06/20 1658  NA 135  K 3.9  CL 99  CO2 24  GLUCOSE 149*  BUN 25*  CREATININE 1.83*  CALCIUM 10.3   Liver Function Tests: No results for input(s): AST, ALT, ALKPHOS, BILITOT, PROT, ALBUMIN in the last 168 hours. Recent Labs  Lab 01/06/20 1700  LIPASE 23   No results for input(s): AMMONIA in the last 168 hours. CBC: Recent Labs  Lab 01/06/20 1658  WBC 20.4*  NEUTROABS 16.3*  HGB 13.3  HCT 41.4  MCV 91.2  PLT 785*   Cardiac Enzymes: No results for input(s): CKTOTAL, CKMB, CKMBINDEX, TROPONINI in the last 168 hours.  BNP (last 3 results) No results for input(s): BNP in the last 8760 hours.  ProBNP (last 3 results) No results for input(s): PROBNP in the last 8760 hours.  CBG: No results for input(s): GLUCAP in the last 168 hours.  Radiological Exams on Admission: CT ABDOMEN PELVIS WO CONTRAST  Result Date: 01/06/2020 CLINICAL DATA:  Nausea and vomiting. EXAM: CT ABDOMEN AND PELVIS WITHOUT CONTRAST TECHNIQUE: Multidetector CT imaging of the abdomen and pelvis was performed following the standard protocol without IV contrast. COMPARISON:  November 17, 2019 FINDINGS: Lower chest: Mild atelectasis is seen within the posterior aspect of the bilateral lung bases. Hepatobiliary: No focal liver abnormality is seen. Status post cholecystectomy. No biliary dilatation. 3.2 cm x 2.1 cm, 1.2 cm x 0.9 cm and 1.4 cm x 1.3 cm areas of heterogeneous mildly increased  attenuation are seen adjacent to the medial aspect of the right lobe of the liver. This is just below the level of the gallbladder fossa and just above an area of surgically anastomosed large bowel. This is new finding when compared to the prior study. Pancreas: Unremarkable. No pancreatic ductal dilatation or surrounding inflammatory changes. Spleen: Normal in size without focal abnormality. A mild to moderate amount of  perisplenic fluid is seen. Adrenals/Urinary Tract: The right adrenal gland is normal in appearance. A 2.6 cm x 2.0 cm isodense left adrenal mass is seen. Kidneys are normal in size, without renal calculi or hydronephrosis. Stable parapelvic renal cysts are seen on the left. Bladder is unremarkable. Stomach/Bowel: There is a small hiatal hernia. Surgically anastomosed bowel is seen along the mid to distal ascending colon. Mildly dilated small bowel loops are seen within the left abdomen. A clear transition zone is not identified. Vascular/Lymphatic: No significant vascular findings are present. No enlarged abdominal or pelvic lymph nodes. Reproductive: Uterus and bilateral adnexa are unremarkable. Other: No abdominal wall hernia or abnormality. Mild to moderate severity mesenteric inflammatory fat stranding is seen throughout the right abdomen. A mild amount of pelvic free fluid is noted. Musculoskeletal: Degenerative changes seen throughout the lumbar spine. IMPRESSION: 1. Findings consistent with a small bowel obstruction versus postoperative ileus. 2. Mild to moderate severity inflammatory process within right abdomen which may be, in part, secondary to the patient's recent surgical intervention. 3. Areas of heterogeneous attenuation within the right upper quadrant, in between the gallbladder fossa and hepatic flexure, likely postoperative in nature. Small hematomas cannot be excluded. Correlation with follow-up abdomen pelvis CT is recommended to determine stability. 4. Stable 2.6 cm x 2.0 cm  isodense left adrenal mass which may represent an adrenal adenoma. 5. Small hiatal hernia. 6. Mild to moderate amount of perisplenic and pelvic free fluid. 7. Aortic atherosclerosis. Aortic Atherosclerosis (ICD10-I70.0). Electronically Signed   By: Virgina Norfolk M.D.   On: 01/06/2020 19:07    EKG: I independently viewed the EKG done and my findings are as followed: EKG was not done in the ED  Assessment/Plan Present on Admission: **None**  Principal Problem:   Nausea & vomiting Active Problems:   S/P partial colectomy   Ileus following gastrointestinal surgery (HCC)   Leukocytosis   Thrombocytosis   AKI (acute kidney injury) ([place])   Obese   Essential hypertension   Hyperlipidemia   Hiatal hernia   Dehydration   Hyperglycemia  Nausea and vomiting secondary to postoperative ileus/small bowel obstruction due to s/p partial colectomy POA CT abdomen and pelvis without contrast showed findings consistent with a small bowel obstruction versus postoperative ileus.  Continue IV Zofran p.r.n. Continue IV hydration Patient has not had vomiting since administration of Zofran in the ED Patient will be placed n.p.o. at this time with plan to advance as tolerated General surgery will be consulted and we shall await their recommendation.  Dehydration Acute kidney injury BUN/creatinine 25/1.33 (baseline creatinine 0.8-0.9) Continue IV hydration Renally adjust medications, avoid nephrotoxic agents/dehydration/hypotension  Leukocytosis/thrombocytosis possibly reactive WBC 20.4; platelets of 785 Patient denies fever, chills.  No CT evidence of enteritis/colitis IV Zosyn will be held at this time Continue to monitor for any acute infectious process and treat accordingly  Hyperglycemia possibly reactive CBG = 149, continue to monitor blood glucose with morning labs  Essential hypertension Continue lisinopril and Toprol-XL  Hyperlipidemia Continue Crestor  Hiatal hernia Continue  Protonix  Obesity (BMI 31.47) Patient counseled on diet and lifestyle modification  DVT prophylaxis: Heparin subcu  Code Status: Full code  Family Communication: None at bedside  Disposition Plan:  Patient is from:                        home Anticipated DC to:                   home  Anticipated DC date:               2-3 days Anticipated DC barriers:         Patient is unstable to be discharged at this time due to nausea and vomiting due to postop ileus/SBO and decreased tolerance of oral intake resulting in acute kidney injury requiring IV hydration.  Consults called: General surgery  Admission status: Inpatient  Bernadette Hoit MD [facility]  01/06/2020, 9:16 PM

## 2020-01-07 LAB — CBC
HCT: 37.4 % (ref 36.0–46.0)
Hemoglobin: 11.7 g/dL — ABNORMAL LOW (ref 12.0–15.0)
MCH: 29.3 pg (ref 26.0–34.0)
MCHC: 31.3 g/dL (ref 30.0–36.0)
MCV: 93.7 fL (ref 80.0–100.0)
Platelets: 595 10*3/uL — ABNORMAL HIGH (ref 150–400)
RBC: 3.99 MIL/uL (ref 3.87–5.11)
RDW: 13.2 % (ref 11.5–15.5)
WBC: 14.2 10*3/uL — ABNORMAL HIGH (ref 4.0–10.5)
nRBC: 0 % (ref 0.0–0.2)

## 2020-01-07 LAB — PROTIME-INR
INR: 1.1 (ref 0.8–1.2)
Prothrombin Time: 13.4 seconds (ref 11.4–15.2)

## 2020-01-07 LAB — APTT: aPTT: 30 seconds (ref 24–36)

## 2020-01-07 LAB — COMPREHENSIVE METABOLIC PANEL
ALT: 20 U/L (ref 0–44)
AST: 16 U/L (ref 15–41)
Albumin: 3.6 g/dL (ref 3.5–5.0)
Alkaline Phosphatase: 56 U/L (ref 38–126)
Anion gap: 9 (ref 5–15)
BUN: 28 mg/dL — ABNORMAL HIGH (ref 8–23)
CO2: 27 mmol/L (ref 22–32)
Calcium: 9.4 mg/dL (ref 8.9–10.3)
Chloride: 102 mmol/L (ref 98–111)
Creatinine, Ser: 1.62 mg/dL — ABNORMAL HIGH (ref 0.44–1.00)
GFR, Estimated: 34 mL/min — ABNORMAL LOW (ref >60)
Glucose, Bld: 127 mg/dL — ABNORMAL HIGH (ref 70–99)
Potassium: 4.4 mmol/L (ref 3.5–5.1)
Sodium: 138 mmol/L (ref 135–145)
Total Bilirubin: 0.5 mg/dL (ref 0.3–1.2)
Total Protein: 6.5 g/dL (ref 6.5–8.1)

## 2020-01-07 LAB — URINALYSIS, COMPLETE (UACMP) WITH MICROSCOPIC
Bilirubin Urine: NEGATIVE
Glucose, UA: NEGATIVE mg/dL
Hgb urine dipstick: NEGATIVE
Ketones, ur: NEGATIVE mg/dL
Nitrite: NEGATIVE
Protein, ur: 30 mg/dL — AB
Specific Gravity, Urine: 1.021 (ref 1.005–1.030)
pH: 5 (ref 5.0–8.0)

## 2020-01-07 LAB — MAGNESIUM: Magnesium: 2 mg/dL (ref 1.7–2.4)

## 2020-01-07 LAB — PHOSPHORUS: Phosphorus: 4.5 mg/dL (ref 2.5–4.6)

## 2020-01-07 MED ORDER — SODIUM CHLORIDE 0.9 % IV SOLN
1.0000 g | INTRAVENOUS | Status: DC
  Administered 2020-01-07 – 2020-01-08 (×2): 1 g via INTRAVENOUS
  Filled 2020-01-07 (×2): qty 10

## 2020-01-07 MED ORDER — SODIUM CHLORIDE 0.9 % IV SOLN: INTRAVENOUS | Status: DC

## 2020-01-07 MED ORDER — SODIUM CHLORIDE 0.9 % IV SOLN: 1.0000 g | Freq: Two times a day (BID) | INTRAVENOUS | Status: DC

## 2020-01-07 NOTE — Consult Note (Signed)
[facility] Consult  Reason for Consult: Nausea/vomiting, ? Ileus  Referring Physician:  Dr. Lonny Prude   Chief Complaint    Post-op Problem      HPI: Catherine Carney is a 69 y.o. female with a recent right hemicolectomy with Dr. Arnoldo Morale 11/15 who called me yesterday and reported she had been having nausea and vomiting after eating thanksgiving meal on Thursday. She has been having some small stools but could not tolerate any meal or liquids. Given this I told her to go to the ED to be worked up for possible obstruction, ileus or constipation.  In the ED labs were done that demonstrated signs of hemoconcentration with a H&H that was up from postop and leukocytosis. She had denied any fevers at home or signs of infection. She had CT that demonstrated some dilated bowel possibly from ileus but intact anastomosis and a collection in the gallbladder fossa likely being post op fluid/ hematoma but no rim enhancement/ abscess.   This morning she still feels bloated but no vomiting. She has been passing some gas but not a ton.   Past Medical History:  Diagnosis Date  . Cataract    Combined form OU  . Family history of adverse reaction to anesthesia    sister had PONV  . Female bladder prolapse   . Hyperlipidemia   . Hypertension   . Hypertensive retinopathy    OU  . Macular hole of right eye   . Osteoarthritis     Past Surgical History:  Procedure Laterality Date  . [place] VITRECTOMY WITH 20 GAUGE MVR PORT FOR MACULAR HOLE Right 04/20/2019   Procedure: 25 GAUGE PARS PLANA VITRECTOMY WITH 20 GAUGE MVR PORT FOR MACULAR HOLE WITH MEMBRANE PEEL;  Surgeon: Bernarda Caffey, MD;  Location: [place];  Service: Ophthalmology;  Laterality: Right;  . BIOPSY  11/17/2019   Procedure: BIOPSY;  Surgeon: [name], DO;  [facility];  Service: Endoscopy;;  . CATARACT EXTRACTION Right   . CHOLECYSTECTOMY    . COLONOSCOPY WITH PROPOFOL N/A 11/17/2019   Procedure:  COLONOSCOPY WITH PROPOFOL;  Surgeon: [name], DO;  [facility];  Service: Endoscopy;  Laterality: N/A;  11:15am  . ESOPHAGOGASTRODUODENOSCOPY (EGD) WITH PROPOFOL N/A 11/17/2019   Procedure: ESOPHAGOGASTRODUODENOSCOPY (EGD) WITH PROPOFOL;  Surgeon: [name], DO;  [facility];  Service: Endoscopy;  Laterality: N/A;  . EYE SURGERY Right 04/20/2019   [facility] repair - PPV/MP - Dr. Bernarda Caffey  . GALLBLADDER SURGERY    . GAS/FLUID EXCHANGE Right 04/20/2019   Procedure: Gas/Fluid Exchange;  Surgeon: Bernarda Caffey, MD;  Location: [place];  Service: Ophthalmology;  Laterality: Right;  . HEMOSTASIS CLIP PLACEMENT  11/17/2019   Procedure: HEMOSTASIS CLIP PLACEMENT;  Surgeon: [name], DO;  [facility];  Service: Endoscopy;;  . PARTIAL COLECTOMY N/A 12/25/2019   Procedure: PARTIAL COLECTOMY;  Surgeon: Aviva Signs, MD;  [facility]: [facility];  Service: General;  Laterality: N/A;  . PHOTOCOAGULATION WITH LASER Right 04/20/2019   Procedure: Photocoagulation With Laser;  Surgeon: Bernarda Caffey, MD;  Location: [place];  Service: Ophthalmology;  Laterality: Right;  . POLYPECTOMY  11/17/2019   Procedure: POLYPECTOMY;  Surgeon: [name], DO;  [facility];  Service: Endoscopy;;  . TUBAL LIGATION      Family History  Problem Relation Age of Onset  . Heart attack Maternal Grandmother   . Cancer Maternal Grandfather   . Cancer  Father        lung  . Other Mother        irregular heart beat  . Hypertension Mother   . Diabetes Brother   . Hypertension Brother   . High Cholesterol Brother   . Atrial fibrillation Brother   . Melanoma Brother        right ear  . Hypertension Sister   . High Cholesterol Sister   . Colon cancer Sister   . Rheum arthritis Sister   . Other Sister        irregular heart beat  . Hypertension Son   . Cancer Other        maternal side  . Stroke Other        maternal side    Social History   Tobacco Use   . Smoking status: Never Smoker  . Smokeless tobacco: Never Used  Vaping Use  . Vaping Use: Never used  Substance Use Topics  . Alcohol use: No    Alcohol/week: 0.0 standard drinks  . Drug use: No    Medications:  I have reviewed the patient's current medications. Prior to Admission:  Medications Prior to Admission  Medication Sig Dispense Refill Last Dose  . acetaminophen (TYLENOL) 500 MG tablet Take 500 mg by mouth every 6 (six) hours as needed (for pain.).    Past Month at Unknown time  . ARTIFICIAL TEAR SOLUTION OP Place 1 drop into both eyes daily as needed (dry eyes).   01/05/2020 at Unknown time  . aspirin EC 81 MG tablet Take 81 mg by mouth daily.   01/06/2020 at 0800  . Calcium Carb-Cholecalciferol (CALCIUM 600+D) 600-800 MG-UNIT TABS Take 1 tablet by mouth daily.   01/06/2020 at Unknown time  . Coenzyme Q10 (COQ10) 100 MG CAPS Take 100 mg by mouth daily.   01/06/2020 at Unknown time  . lisinopril (ZESTRIL) 20 MG tablet Take 20 mg by mouth daily.   01/06/2020 at Unknown time  . metoprolol succinate (TOPROL-XL) 25 MG 24 hr tablet Take 25 mg by mouth daily.    01/06/2020 at 0800  . Multiple Vitamin (MULTIVITAMIN WITH MINERALS) TABS tablet Take 1 tablet by mouth daily. Women's Multivitamin 50+   01/06/2020 at Unknown time  . Nutritional Supplement LIQD Take 1 Bottle by mouth daily. Immune max otc supplement   01/06/2020 at Unknown time  . nystatin cream (MYCOSTATIN) Apply 1 application topically 2 (two) times daily.   01/06/2020 at Unknown time  . Omega 3-6-9 Fatty Acids (TRIPLE OMEGA-3-6-9 PO) Take 1 capsule by mouth daily.   01/06/2020 at Unknown time  . omeprazole (PRILOSEC) 40 MG capsule Take 1 capsule (40 mg total) by mouth 2 (two) times daily before a meal. 60 capsule 5 01/05/2020 at Unknown time  . rosuvastatin (CRESTOR) 20 MG tablet Take 20 mg by mouth at bedtime.   01/05/2020 at Unknown time  . traMADol (ULTRAM) 50 MG tablet Take 1 tablet (50 mg total) by mouth every 6  (six) hours as needed. 25 tablet 0 01/05/2020 at Unknown time  . vitamin B-12 (CYANOCOBALAMIN) 500 MCG tablet Take 500 mcg by mouth daily.   01/05/2020 at Unknown time   Scheduled: . feeding supplement  1 Container Oral TID BM  . heparin  5,000 Units Subcutaneous Q8H  . lisinopril  20 mg Oral Daily  . metoprolol succinate  25 mg Oral Daily  . pantoprazole  40 mg Oral Daily  . rosuvastatin  20 mg Oral QHS   Continuous: .  sodium chloride 75 mL/hr at 01/07/20 1140   FXO:VANVBTYOMAY (ZOFRAN) IV  No Known Allergies   ROS:  A comprehensive review of systems was negative except for: Gastrointestinal: positive for constipation, nausea and vomiting Genitourinary: positive for dysuria  Blood pressure 129/68, pulse 90, temperature 99.2 F (37.3 C), temperature source Oral, resp. rate 20, height 5\' 6"  (1.676 m), weight 88.5 kg, SpO2 97 %. Physical Exam Vitals reviewed.  Constitutional:      Appearance: Normal appearance.  HENT:     Head: Normocephalic.     Nose: Nose normal.  Eyes:     Extraocular Movements: Extraocular movements intact.  Cardiovascular:     Rate and Rhythm: Normal rate.  Pulmonary:     Effort: Pulmonary effort is normal.  Abdominal:     General: There is no distension.     Palpations: Abdomen is soft.     Tenderness: There is no abdominal tenderness.     Comments: Steri strips in place, no erythema or drainage from midline  Musculoskeletal:        General: No swelling. Normal range of motion.  Skin:    General: Skin is warm.  Neurological:     General: No focal deficit present.     Mental Status: She is alert and oriented to person, place, and time.  Psychiatric:        Mood and Affect: Mood normal.        Behavior: Behavior normal.        Thought Content: Thought content normal.        Judgment: Judgment normal.     Results: Results for orders placed or performed during the hospital encounter of 01/06/20 (from the past 48 hour(s))  CBC with  Differential     Status: Abnormal   Collection Time: 01/06/20  4:58 PM  Result Value Ref Range   WBC 20.4 (H) 4.0 - 10.5 K/uL   RBC 4.54 3.87 - 5.11 MIL/uL   Hemoglobin 13.3 12.0 - 15.0 g/dL   HCT 41.4 36 - 46 %   MCV 91.2 80.0 - 100.0 fL   MCH 29.3 26.0 - 34.0 pg   MCHC 32.1 30.0 - 36.0 g/dL   RDW 13.2 11.5 - 15.5 %   Platelets 785 (H) 150 - 400 K/uL   nRBC 0.0 0.0 - 0.2 %   Neutrophils Relative % 80 %   Neutro Abs 16.3 (H) 1.7 - 7.7 K/uL   Lymphocytes Relative 14 %   Lymphs Abs 2.8 0.7 - 4.0 K/uL   Monocytes Relative 5 %   Monocytes Absolute 1.1 (H) 0.1 - 1.0 K/uL   Eosinophils Relative 0 %   Eosinophils Absolute 0.0 0.0 - 0.5 K/uL   Basophils Relative 0 %   Basophils Absolute 0.1 0.0 - 0.1 K/uL   Immature Granulocytes 1 %   Abs Immature Granulocytes 0.11 (H) 0.00 - 0.07 K/uL    Comment: Performed at [facility], [address] [place], [place] [address]  Basic metabolic panel     Status: Abnormal   Collection Time: 01/06/20  4:58 PM  Result Value Ref Range   Sodium 135 135 - 145 mmol/L   Potassium 3.9 3.5 - 5.1 mmol/L   Chloride 99 98 - 111 mmol/L   CO2 24 22 - 32 mmol/L   Glucose, Bld 149 (H) 70 - 99 mg/dL    Comment: Glucose reference range applies only to samples taken after fasting for at least 8 hours.   BUN 25 (H) 8 -  23 mg/dL   Creatinine, Ser 1.83 (H) 0.44 - 1.00 mg/dL   Calcium 10.3 8.9 - 10.3 mg/dL   GFR, Estimated 29 (L) >60 mL/min    Comment: (NOTE) Calculated using the CKD-EPI Creatinine Equation (2021)    Anion gap 12 5 - 15    Comment: Performed at [facility], [address] [place], [place] [address]  Lipase, blood     Status: None   Collection Time: 01/06/20  5:00 PM  Result Value Ref Range   Lipase 23 11 - 51 U/L    Comment: Performed at [facility], [address] [place], [place] [address]  Resp Panel by RT-PCR (Flu A&B, Covid) Nasopharyngeal Swab     Status: None   Collection Time: 01/06/20  7:46 PM   Specimen: Nasopharyngeal Swab;  Nasopharyngeal(NP) swabs in vial transport medium  Result Value Ref Range   SARS Coronavirus 2 by RT PCR NEGATIVE NEGATIVE    Comment: (NOTE) SARS-CoV-2 target nucleic acids are NOT DETECTED.  The SARS-CoV-2 RNA is generally detectable in upper respiratory specimens during the acute phase of infection. The lowest concentration of SARS-CoV-2 viral copies this assay can detect is 138 copies/mL. A negative result does not preclude SARS-Cov-2 infection and should not be used as the sole basis for treatment or other patient management decisions. A negative result may occur with  improper specimen collection/handling, submission of specimen other than nasopharyngeal swab, presence of viral mutation(s) within the areas targeted by this assay, and inadequate number of viral copies(<138 copies/mL). A negative result must be combined with clinical observations, patient history, and epidemiological information. The expected result is Negative.  Fact Sheet for Patients:  EntrepreneurPulse.com.au  Fact Sheet for Healthcare Providers:  IncredibleEmployment.be  This test is no t yet approved or cleared by the [place] FDA and  has been authorized for detection and/or diagnosis of SARS-CoV-2 by FDA under an Emergency Use Authorization (EUA). This EUA will remain  in effect (meaning this test can be used) for the duration of the COVID-19 declaration under Section 564(b)(1) of the Act, 21 U.S.C.section 360bbb-3(b)(1), unless the authorization is terminated  or revoked sooner.       Influenza A by PCR NEGATIVE NEGATIVE   Influenza B by PCR NEGATIVE NEGATIVE    Comment: (NOTE) The Xpert Xpress SARS-CoV-2/FLU/RSV plus assay is intended as an aid in the diagnosis of influenza from Nasopharyngeal swab specimens and should not be used as a sole basis for treatment. Nasal washings and aspirates are unacceptable for Xpert Xpress  SARS-CoV-2/FLU/RSV testing.  Fact Sheet for Patients: EntrepreneurPulse.com.au  Fact Sheet for Healthcare Providers: IncredibleEmployment.be  This test is not yet approved or cleared by the [place] FDA and has been authorized for detection and/or diagnosis of SARS-CoV-2 by FDA under an Emergency Use Authorization (EUA). This EUA will remain in effect (meaning this test can be used) for the duration of the COVID-19 declaration under Section 564(b)(1) of the Act, 21 U.S.C. section 360bbb-3(b)(1), unless the authorization is terminated or revoked.  Performed at [facility], [address] [place], [place] [address]   Comprehensive metabolic panel     Status: Abnormal   Collection Time: 01/07/20  7:58 AM  Result Value Ref Range   Sodium 138 135 - 145 mmol/L   Potassium 4.4 3.5 - 5.1 mmol/L   Chloride 102 98 - 111 mmol/L   CO2 27 22 - 32 mmol/L   Glucose, Bld 127 (H) 70 - 99 mg/dL    Comment: Glucose  reference range applies only to samples taken after fasting for at least 8 hours.   BUN 28 (H) 8 - 23 mg/dL   Creatinine, Ser 1.62 (H) 0.44 - 1.00 mg/dL   Calcium 9.4 8.9 - 10.3 mg/dL   Total Protein 6.5 6.5 - 8.1 g/dL   Albumin 3.6 3.5 - 5.0 g/dL   AST 16 15 - 41 U/L   ALT 20 0 - 44 U/L   Alkaline Phosphatase 56 38 - 126 U/L   Total Bilirubin 0.5 0.3 - 1.2 mg/dL   GFR, Estimated 34 (L) >60 mL/min    Comment: (NOTE) Calculated using the CKD-EPI Creatinine Equation (2021)    Anion gap 9 5 - 15    Comment: Performed at [facility], [address], [place], [place] [address]  CBC     Status: Abnormal   Collection Time: 01/07/20  7:58 AM  Result Value Ref Range   WBC 14.2 (H) 4.0 - 10.5 K/uL   RBC 3.99 3.87 - 5.11 MIL/uL   Hemoglobin 11.7 (L) 12.0 - 15.0 g/dL   HCT 37.4 36 - 46 %   MCV 93.7 80.0 - 100.0 fL   MCH 29.3 26.0 - 34.0 pg   MCHC 31.3 30.0 - 36.0 g/dL   RDW 13.2 11.5 - 15.5 %   Platelets 595 (H) 150 - 400 K/uL   nRBC 0.0 0.0  - 0.2 %    Comment: Performed at [facility], [address], [place], [place] [address]  Protime-INR     Status: None   Collection Time: 01/07/20  7:58 AM  Result Value Ref Range   Prothrombin Time 13.4 11.4 - 15.2 seconds   INR 1.1 0.8 - 1.2    Comment: (NOTE) INR goal varies based on device and disease states. Performed at [facility], [address], [place], [place] [address]   APTT     Status: None   Collection Time: 01/07/20  7:58 AM  Result Value Ref Range   aPTT 30 24 - 36 seconds    Comment: Performed at [facility], [address], [place], [place] [address]  Magnesium     Status: None   Collection Time: 01/07/20  7:58 AM  Result Value Ref Range   Magnesium 2.0 1.7 - 2.4 mg/dL    Comment: Performed at [facility], [address], [place], [place] [address]  Phosphorus     Status: None   Collection Time: 01/07/20  7:58 AM  Result Value Ref Range   Phosphorus 4.5 2.5 - 4.6 mg/dL    Comment: Performed at [facility], [address], [place], [place] [address]   Personally reviewed- dilated bowel but no transition, stomach not overly distended, collection in gallbladder fossa but nothing that looks like abscess at this time  CT ABDOMEN PELVIS WO CONTRAST  Result Date: 01/06/2020 CLINICAL DATA:  Nausea and vomiting. EXAM: CT ABDOMEN AND PELVIS WITHOUT CONTRAST TECHNIQUE: Multidetector CT imaging of the abdomen and pelvis was performed following the standard protocol without IV contrast. COMPARISON:  November 17, 2019 FINDINGS: Lower chest: Mild atelectasis is seen within the posterior aspect of the bilateral lung bases. Hepatobiliary: No focal liver abnormality is seen. Status post cholecystectomy. No biliary dilatation. 3.2 cm x 2.1 cm, 1.2 cm x 0.9 cm and 1.4 cm x 1.3 cm areas of heterogeneous mildly increased attenuation are seen adjacent to the medial aspect of the right lobe of the liver. This is just below the level of the gallbladder fossa and just  above an area of  surgically anastomosed large bowel. This is new finding when compared to the prior study. Pancreas: Unremarkable. No pancreatic ductal dilatation or surrounding inflammatory changes. Spleen: Normal in size without focal abnormality. A mild to moderate amount of perisplenic fluid is seen. Adrenals/Urinary Tract: The right adrenal gland is normal in appearance. A 2.6 cm x 2.0 cm isodense left adrenal mass is seen. Kidneys are normal in size, without renal calculi or hydronephrosis. Stable parapelvic renal cysts are seen on the left. Bladder is unremarkable. Stomach/Bowel: There is a small hiatal hernia. Surgically anastomosed bowel is seen along the mid to distal ascending colon. Mildly dilated small bowel loops are seen within the left abdomen. A clear transition zone is not identified. Vascular/Lymphatic: No significant vascular findings are present. No enlarged abdominal or pelvic lymph nodes. Reproductive: Uterus and bilateral adnexa are unremarkable. Other: No abdominal wall hernia or abnormality. Mild to moderate severity mesenteric inflammatory fat stranding is seen throughout the right abdomen. A mild amount of pelvic free fluid is noted. Musculoskeletal: Degenerative changes seen throughout the lumbar spine. IMPRESSION: 1. Findings consistent with a small bowel obstruction versus postoperative ileus. 2. Mild to moderate severity inflammatory process within right abdomen which may be, in part, secondary to the patient's recent surgical intervention. 3. Areas of heterogeneous attenuation within the right upper quadrant, in between the gallbladder fossa and hepatic flexure, likely postoperative in nature. Small hematomas cannot be excluded. Correlation with follow-up abdomen pelvis CT is recommended to determine stability. 4. Stable 2.6 cm x 2.0 cm isodense left adrenal mass which may represent an adrenal adenoma. 5. Small hiatal hernia. 6. Mild to moderate amount of perisplenic and pelvic free fluid. 7. Aortic  atherosclerosis. Aortic Atherosclerosis (ICD10-I70.0). Electronically Signed   By: Virgina Norfolk M.D.   On: 01/06/2020 19:07     Assessment & Plan:  TEARIA GIBBS is a 69 y.o. female with what looks like a post operative ileus. She was very hemoconcentrated and had an AKI that is improving with hydration. She did describe some burning with urination. She could have a UTI that provoked the ileus. Received 1 dose IV zosyn in Ed but not continued as patient had no source of infection, and I agree at this time no clear reason for antibiotics.   -NPO, ice and chips ok -UA and culture sent to see if UTI  -Home meds -Will take over from hospitalist, appreciate their help. Discussed with Dr. Lonny Prude  -NS @ 53, AKI from dehydration improving  -Labs in the AM -SCDs, heparin sq All questions were answered to the satisfaction of the patient.    Virl Cagey 01/07/2020, 11:59 AM

## 2020-01-07 NOTE — Progress Notes (Signed)
Patient seen and examined at bedside. Patient with post-op ileus vs SBO. AKI is improving with IV fluids. After discussion with general surgery, service has been transferred from [facility] to general surgery. If any concerns, [facility] can be re-consulted.  Cordelia Poche, MD [facility] 01/08/2020, 1:10 PM

## 2020-01-08 LAB — CBC
HCT: 32.3 % — ABNORMAL LOW (ref 36.0–46.0)
Hemoglobin: 9.8 g/dL — ABNORMAL LOW (ref 12.0–15.0)
MCH: 29.3 pg (ref 26.0–34.0)
MCHC: 30.3 g/dL (ref 30.0–36.0)
MCV: 96.4 fL (ref 80.0–100.0)
Platelets: 427 10*3/uL — ABNORMAL HIGH (ref 150–400)
RBC: 3.35 MIL/uL — ABNORMAL LOW (ref 3.87–5.11)
RDW: 12.9 % (ref 11.5–15.5)
WBC: 8.2 10*3/uL (ref 4.0–10.5)
nRBC: 0 % (ref 0.0–0.2)

## 2020-01-08 LAB — BASIC METABOLIC PANEL
Anion gap: 7 (ref 5–15)
BUN: 30 mg/dL — ABNORMAL HIGH (ref 8–23)
CO2: 26 mmol/L (ref 22–32)
Calcium: 8.8 mg/dL — ABNORMAL LOW (ref 8.9–10.3)
Chloride: 106 mmol/L (ref 98–111)
Creatinine, Ser: 1.07 mg/dL — ABNORMAL HIGH (ref 0.44–1.00)
GFR, Estimated: 56 mL/min — ABNORMAL LOW (ref >60)
Glucose, Bld: 99 mg/dL (ref 70–99)
Potassium: 4.2 mmol/L (ref 3.5–5.1)
Sodium: 139 mmol/L (ref 135–145)

## 2020-01-08 NOTE — Progress Notes (Signed)
Subjective: Patient feels much better.  She denies any significant nausea or vomiting.  She has had 3 bowel movements.  She is hungry.  Objective: Vital signs in last 24 hours: Temp:  [98.3 F (36.8 C)-99.2 F (37.3 C)] 98.3 F (36.8 C) (11/29 0339) Pulse Rate:  [81-90] 81 (11/29 0339) Resp:  [16-20] 18 (11/29 0339) BP: (117-129)/(51-68) 124/52 (11/29 0339) SpO2:  [92 %-97 %] 97 % (11/29 0339) Last BM Date: 01/08/20  Intake/Output from previous day: 11/28 0701 - 11/29 0700 In: 1296.9 [P.O.:60; I.V.:1136.9; IV Piggyback:100] Out: 600 [Urine:600] Intake/Output this shift: No intake/output data recorded.  General appearance: alert, cooperative and no distress Resp: clear to auscultation bilaterally Cardio: regular rate and rhythm, S1, S2 normal, no murmur, click, rub or gallop GI: soft, non-tender; bowel sounds normal; no masses,  no organomegaly and Incision well-healed.  Lab Results:  Recent Labs    01/06/20 1658 01/07/20 0758  WBC 20.4* 14.2*  HGB 13.3 11.7*  HCT 41.4 37.4  PLT 785* 595*   BMET Recent Labs    01/06/20 1658 01/07/20 0758  NA 135 138  K 3.9 4.4  CL 99 102  CO2 24 27  GLUCOSE 149* 127*  BUN 25* 28*  CREATININE 1.83* 1.62*  CALCIUM 10.3 9.4   PT/INR Recent Labs    01/07/20 0758  LABPROT 13.4  INR 1.1    Studies/Results: CT ABDOMEN PELVIS WO CONTRAST  Result Date: 01/06/2020 CLINICAL DATA:  Nausea and vomiting. EXAM: CT ABDOMEN AND PELVIS WITHOUT CONTRAST TECHNIQUE: Multidetector CT imaging of the abdomen and pelvis was performed following the standard protocol without IV contrast. COMPARISON:  November 17, 2019 FINDINGS: Lower chest: Mild atelectasis is seen within the posterior aspect of the bilateral lung bases. Hepatobiliary: No focal liver abnormality is seen. Status post cholecystectomy. No biliary dilatation. 3.2 cm x 2.1 cm, 1.2 cm x 0.9 cm and 1.4 cm x 1.3 cm areas of heterogeneous mildly increased attenuation are seen adjacent to  the medial aspect of the right lobe of the liver. This is just below the level of the gallbladder fossa and just above an area of surgically anastomosed large bowel. This is new finding when compared to the prior study. Pancreas: Unremarkable. No pancreatic ductal dilatation or surrounding inflammatory changes. Spleen: Normal in size without focal abnormality. A mild to moderate amount of perisplenic fluid is seen. Adrenals/Urinary Tract: The right adrenal gland is normal in appearance. A 2.6 cm x 2.0 cm isodense left adrenal mass is seen. Kidneys are normal in size, without renal calculi or hydronephrosis. Stable parapelvic renal cysts are seen on the left. Bladder is unremarkable. Stomach/Bowel: There is a small hiatal hernia. Surgically anastomosed bowel is seen along the mid to distal ascending colon. Mildly dilated small bowel loops are seen within the left abdomen. A clear transition zone is not identified. Vascular/Lymphatic: No significant vascular findings are present. No enlarged abdominal or pelvic lymph nodes. Reproductive: Uterus and bilateral adnexa are unremarkable. Other: No abdominal wall hernia or abnormality. Mild to moderate severity mesenteric inflammatory fat stranding is seen throughout the right abdomen. A mild amount of pelvic free fluid is noted. Musculoskeletal: Degenerative changes seen throughout the lumbar spine. IMPRESSION: 1. Findings consistent with a small bowel obstruction versus postoperative ileus. 2. Mild to moderate severity inflammatory process within right abdomen which may be, in part, secondary to the patient's recent surgical intervention. 3. Areas of heterogeneous attenuation within the right upper quadrant, in between the gallbladder fossa and hepatic flexure, likely postoperative  in nature. Small hematomas cannot be excluded. Correlation with follow-up abdomen pelvis CT is recommended to determine stability. 4. Stable 2.6 cm x 2.0 cm isodense left adrenal mass which may  represent an adrenal adenoma. 5. Small hiatal hernia. 6. Mild to moderate amount of perisplenic and pelvic free fluid. 7. Aortic atherosclerosis. Aortic Atherosclerosis (ICD10-I70.0). Electronically Signed   By: Virgina Norfolk M.D.   On: 01/06/2020 19:07    Anti-infectives: Anti-infectives (From admission, onward)   Start     Dose/Rate Route Frequency Ordered Stop   01/07/20 1415  cefTRIAXone (ROCEPHIN) 1 g in sodium chloride 0.9 % 100 mL IVPB        1 g 200 mL/hr over 30 Minutes Intravenous Every 24 hours 01/07/20 1318     01/07/20 1400  cefoTEtan (CEFOTAN) 1 g in sodium chloride 0.9 % 100 mL IVPB  Status:  Discontinued        1 g 200 mL/hr over 30 Minutes Intravenous Every 12 hours 01/07/20 1315 01/07/20 1318   01/06/20 1945  piperacillin-tazobactam (ZOSYN) IVPB 3.375 g        3.375 g 100 mL/hr over 30 Minutes Intravenous  Once 01/06/20 1938 01/06/20 2058   01/06/20 1930  piperacillin-tazobactam (ZOSYN) IVPB 2.25 g  Status:  Discontinued        2.25 g 100 mL/hr over 30 Minutes Intravenous  Once 01/06/20 1922 01/06/20 1938      Assessment/Plan: Impression: Nausea and vomiting have resolved.  No urine culture yet.  Suspect maybe patient had underlying UTI.  We will continue to treat with Rocephin.  Labs pending from this morning. Plan: We will start full liquid diet, advance as tolerated.  Anticipate discharge in next 24 to 48 hours.  LOS: 2 days    Aviva Signs 01/08/2020

## 2020-01-09 LAB — CBC
HCT: 31.2 % — ABNORMAL LOW (ref 36.0–46.0)
Hemoglobin: 9.5 g/dL — ABNORMAL LOW (ref 12.0–15.0)
MCH: 29.1 pg (ref 26.0–34.0)
MCHC: 30.4 g/dL (ref 30.0–36.0)
MCV: 95.4 fL (ref 80.0–100.0)
Platelets: 400 10*3/uL (ref 150–400)
RBC: 3.27 MIL/uL — ABNORMAL LOW (ref 3.87–5.11)
RDW: 12.5 % (ref 11.5–15.5)
WBC: 6.8 10*3/uL (ref 4.0–10.5)
nRBC: 0 % (ref 0.0–0.2)

## 2020-01-09 LAB — URINE CULTURE: Culture: <10000 — AB

## 2020-01-09 LAB — BASIC METABOLIC PANEL
Anion gap: 10 (ref 5–15)
BUN: 20 mg/dL (ref 8–23)
CO2: 18 mmol/L — ABNORMAL LOW (ref 22–32)
Calcium: 8.5 mg/dL — ABNORMAL LOW (ref 8.9–10.3)
Chloride: 110 mmol/L (ref 98–111)
Creatinine, Ser: 0.87 mg/dL (ref 0.44–1.00)
GFR, Estimated: >60 mL/min (ref >60)
Glucose, Bld: 83 mg/dL (ref 70–99)
Potassium: 4.8 mmol/L (ref 3.5–5.1)
Sodium: 138 mmol/L (ref 135–145)

## 2020-01-09 MED ORDER — CIPROFLOXACIN HCL 500 MG PO TABS: 500.0000 mg | ORAL_TABLET | Freq: Two times a day (BID) | ORAL | 0 refills | Status: DC

## 2020-01-09 NOTE — Plan of Care (Signed)
  Problem: Education: Goal: Knowledge of General Education information will improve Description: Including pain rating scale, medication(s)/side effects and non-pharmacologic comfort measures Outcome: Adequate for Discharge   Problem: Health Behavior/Discharge Planning: Goal: Ability to manage health-related needs will improve Outcome: Adequate for Discharge   Problem: Clinical Measurements: Goal: Ability to maintain clinical measurements within normal limits will improve Outcome: Adequate for Discharge Goal: Will remain free from infection Outcome: Adequate for Discharge Goal: Diagnostic test results will improve Outcome: Adequate for Discharge Goal: Respiratory complications will improve Outcome: Adequate for Discharge Goal: Cardiovascular complication will be avoided Outcome: Adequate for Discharge   Problem: Activity: Goal: Risk for activity intolerance will decrease Outcome: Adequate for Discharge   Problem: Nutrition: Goal: Adequate nutrition will be maintained Outcome: Adequate for Discharge   Problem: Coping: Goal: Level of anxiety will decrease Outcome: Adequate for Discharge   Problem: Elimination: Goal: Will not experience complications related to bowel motility Outcome: Adequate for Discharge Goal: Will not experience complications related to urinary retention Outcome: Adequate for Discharge   Problem: Pain Managment: Goal: General experience of comfort will improve Outcome: Adequate for Discharge   Problem: Safety: Goal: Ability to remain free from injury will improve Outcome: Adequate for Discharge   Problem: Skin Integrity: Goal: Risk for impaired skin integrity will decrease Outcome: Adequate for Discharge   Problem: Skin Integrity: Goal: Risk for impaired skin integrity will decrease Outcome: Adequate for Discharge   

## 2020-01-09 NOTE — Discharge Summary (Signed)
Physician Discharge Summary  Patient ID: Catherine Carney MRN: [id number] DOB/AGE: 1950-05-30 69 y.o.  Admit date: 01/06/2020 Discharge date: 01/09/2020  Admission Diagnoses: Nausea and vomiting, status post right hemicolectomy  Discharge Diagnoses: Same Principal Problem:   Nausea & vomiting Active Problems:   S/P partial colectomy   Ileus following gastrointestinal surgery (HCC)   Leukocytosis   Thrombocytosis   AKI (acute kidney injury) ([place])   Obese   Essential hypertension   Hyperlipidemia   Hiatal hernia   Dehydration   Hyperglycemia   Discharged Condition: good  Hospital Course: Patient is a 69 year old white female status post right hemicolectomy for colon polyps who presents to [facility] with several day history of worsening nausea and vomiting.  Her CT scan of the abdomen was for the most part unremarkable.  She was noted to have bacteria in her urine.  She was admitted to the hospital for hydration and IV antibiotics.  She quickly improved.  She had no emesis in the hospital.  Her diet was advanced without difficulty.  Her leukocytosis resolved. Patient is being discharged home with presumed UTI in good and improving condition.   Discharge Exam: Blood pressure 134/61, pulse 71, temperature 98.6 F (37 C), temperature source Oral, resp. rate 18, height 5\' 6"  (1.676 m), weight 88.5 kg, SpO2 97 %. General appearance: alert, cooperative and no distress Resp: clear to auscultation bilaterally Cardio: regular rate and rhythm, S1, S2 normal, no murmur, click, rub or gallop GI: soft, non-tender; bowel sounds normal; no masses,  no organomegaly and Incision well-healed  Disposition: Discharge disposition: 01-Home or Self Care       Discharge Instructions    Diet - low sodium heart healthy   Complete by: As directed    Increase activity slowly   Complete by: As directed      Allergies as of 01/09/2020   No Known Allergies     Medication List    TAKE  these medications   acetaminophen 500 MG tablet Commonly known as: TYLENOL Take 500 mg by mouth every 6 (six) hours as needed (for pain.).   ARTIFICIAL TEAR SOLUTION OP Place 1 drop into both eyes daily as needed (dry eyes).   aspirin EC 81 MG tablet Take 81 mg by mouth daily.   Calcium 600+D 600-800 MG-UNIT Tabs Generic drug: Calcium Carb-Cholecalciferol Take 1 tablet by mouth daily.   ciprofloxacin 500 MG tablet Commonly known as: Cipro Take 1 tablet (500 mg total) by mouth 2 (two) times daily.   CoQ10 100 MG Caps Take 100 mg by mouth daily.   lisinopril 20 MG tablet Commonly known as: ZESTRIL Take 20 mg by mouth daily.   metoprolol succinate 25 MG 24 hr tablet Commonly known as: TOPROL-XL Take 25 mg by mouth daily.   multivitamin with minerals Tabs tablet Take 1 tablet by mouth daily. Women's Multivitamin 50+   Nutritional Supplement Liqd Take 1 Bottle by mouth daily. Immune max otc supplement   nystatin cream Commonly known as: MYCOSTATIN Apply 1 application topically 2 (two) times daily.   omeprazole 40 MG capsule Commonly known as: PRILOSEC Take 1 capsule (40 mg total) by mouth 2 (two) times daily before a meal.   rosuvastatin 20 MG tablet Commonly known as: CRESTOR Take 20 mg by mouth at bedtime.   traMADol 50 MG tablet Commonly known as: Ultram Take 1 tablet (50 mg total) by mouth every 6 (six) hours as needed.   TRIPLE OMEGA-3-6-9 PO Take 1 capsule by mouth  daily.   vitamin B-12 500 MCG tablet Commonly known as: CYANOCOBALAMIN Take 500 mcg by mouth daily.       Follow-up Information    Aviva Signs, MD Follow up.   Specialty: General Surgery Why: As needed Contact information: [id number] [place] [address] [phone]               Signed: Aviva Signs 01/09/2020, 7:25 AM

## 2020-03-07 ENCOUNTER — Other Ambulatory Visit: Payer: Self-pay

## 2020-03-07 ENCOUNTER — Ambulatory Visit (INDEPENDENT_AMBULATORY_CARE_PROVIDER_SITE_OTHER): Payer: Medicare HMO | Admitting: Obstetrics & Gynecology

## 2020-03-07 ENCOUNTER — Encounter: Payer: Self-pay | Admitting: Obstetrics & Gynecology

## 2020-03-07 VITALS — BP 153/79 | HR 69 | Wt 188.0 lb

## 2020-03-07 DIAGNOSIS — N813 Complete uterovaginal prolapse: Secondary | ICD-10-CM

## 2020-03-07 DIAGNOSIS — Z4689 Encounter for fitting and adjustment of other specified devices: Secondary | ICD-10-CM | POA: Diagnosis not present

## 2020-03-07 NOTE — Progress Notes (Signed)
Chief Complaint  Patient presents with  . Pessary Check    Blood pressure (!) 153/79, pulse 69, weight 188 lb (85.3 kg).  Catherine Carney presents today for routine follow up related to her pessary.   She uses a Milex ring with support #6 She reports no vaginal discharge and no vaginal bleeding   Likert scale(1 not bothersome -5 very bothersome)  :  1  Exam reveals no undue vaginal mucosal pressure of breakdown, no discharge and no vaginal bleeding.  Vaginal Epithelial Abnormality Classification System:   0 0    No abnormalities 1    Epithelial erythema 2    Granulation tissue 3    Epithelial break or erosion, 1 cm or less 4    Epithelial break or erosion, 1 cm or greater  The pessary is removed, cleaned and replaced without difficulty.    No diagnosis found.   Catherine Carney will be sen back in 4 months for continued follow up.  Florian Buff, MD  03/07/2020 10:44 AM

## 2020-03-12 NOTE — Progress Notes (Signed)
[facility] Note  03/13/2020     CHIEF COMPLAINT Patient presents for Retina Follow Up   HISTORY OF PRESENT ILLNESS: Catherine Carney is a 70 y.o. female who presents to the clinic today for:   HPI    Retina Follow Up    Patient presents with  Other.  In right eye.  This started 6 months ago.  I, the attending physician,  performed the HPI with the patient and updated documentation appropriately.          Comments    Patient here for 6 months retina follow up for mac hole OD. Patient states vision about the same. No eye pain. Had intestine surgery in Nov. 2021.       Last edited by Bernarda Caffey, MD on 03/13/2020  8:14 AM. (History)    pt    Referring physician: Chesley Noon, MD [place],  [place] [address]  HISTORICAL INFORMATION:   Selected notes from the MEDICAL RECORD NUMBER Referred by Dr Radford Pax for retina eval   CURRENT MEDICATIONS: Current Outpatient Medications (Ophthalmic Drugs)  Medication Sig  . ARTIFICIAL TEAR SOLUTION OP Place 1 drop into both eyes daily as needed (dry eyes).   No current facility-administered medications for this visit. (Ophthalmic Drugs)   Current Outpatient Medications (Other)  Medication Sig  . acetaminophen (TYLENOL) 500 MG tablet Take 500 mg by mouth every 6 (six) hours as needed (for pain.).   Marland Kitchen aspirin EC 81 MG tablet Take 81 mg by mouth daily.  . Calcium Carb-Cholecalciferol (CALCIUM 600+D) 600-800 MG-UNIT TABS Take 1 tablet by mouth daily.  . ciprofloxacin (CIPRO) 500 MG tablet Take 1 tablet (500 mg total) by mouth 2 (two) times daily.  . Coenzyme Q10 (COQ10) 100 MG CAPS Take 100 mg by mouth daily.  Marland Kitchen lisinopril (ZESTRIL) 20 MG tablet Take 20 mg by mouth daily.  . metoprolol succinate (TOPROL-XL) 25 MG 24 hr tablet Take 25 mg by mouth daily.   . Multiple Vitamin (MULTIVITAMIN WITH MINERALS) TABS tablet Take 1 tablet by mouth daily. Women's Multivitamin 50+  . Nutritional Supplement  LIQD Take 1 Bottle by mouth daily. Immune max otc supplement  . nystatin cream (MYCOSTATIN) Apply 1 application topically 2 (two) times daily.  Ernestine Conrad 3-6-9 Fatty Acids (TRIPLE OMEGA-3-6-9 PO) Take 1 capsule by mouth daily.  Marland Kitchen omeprazole (PRILOSEC) 40 MG capsule Take 1 capsule (40 mg total) by mouth 2 (two) times daily before a meal.  . rosuvastatin (CRESTOR) 20 MG tablet Take 20 mg by mouth at bedtime.  . traMADol (ULTRAM) 50 MG tablet Take 1 tablet (50 mg total) by mouth every 6 (six) hours as needed.  . vitamin B-12 (CYANOCOBALAMIN) 500 MCG tablet Take 500 mcg by mouth daily.   No current facility-administered medications for this visit. (Other)      REVIEW OF SYSTEMS: ROS    Positive for: Gastrointestinal, Musculoskeletal, Eyes   Negative for: Constitutional, Neurological, Skin, Genitourinary, HENT, Endocrine, Cardiovascular, Respiratory, Psychiatric, Allergic/Imm, Heme/Lymph   Last edited by Theodore Demark, COA on 03/13/2020  7:52 AM. (History)       ALLERGIES No Known Allergies  PAST MEDICAL HISTORY Past Medical History:  Diagnosis Date  . Cataract    Combined form OU  . Family history of adverse reaction to anesthesia    sister had PONV  . Female bladder prolapse   . Hyperlipidemia   . Hypertension   . Hypertensive retinopathy  OU  . Macular hole of right eye   . Osteoarthritis    Past Surgical History:  Procedure Laterality Date  . [place] VITRECTOMY WITH 20 GAUGE MVR PORT FOR MACULAR HOLE Right 04/20/2019   Procedure: 25 GAUGE PARS PLANA VITRECTOMY WITH 20 GAUGE MVR PORT FOR MACULAR HOLE WITH MEMBRANE PEEL;  Surgeon: Bernarda Caffey, MD;  Location: [facility];  Service: Ophthalmology;  Laterality: Right;  . BIOPSY  11/17/2019   Procedure: BIOPSY;  Surgeon: [name], DO;  [facility];  Service: Endoscopy;;  . CATARACT EXTRACTION Right   . CHOLECYSTECTOMY    . COLONOSCOPY WITH PROPOFOL N/A 11/17/2019   Procedure: COLONOSCOPY WITH  PROPOFOL;  Surgeon: [name], DO;  [facility];  Service: Endoscopy;  Laterality: N/A;  11:15am  . ESOPHAGOGASTRODUODENOSCOPY (EGD) WITH PROPOFOL N/A 11/17/2019   Procedure: ESOPHAGOGASTRODUODENOSCOPY (EGD) WITH PROPOFOL;  Surgeon: [name], DO;  [facility];  Service: Endoscopy;  Laterality: N/A;  . EYE SURGERY Right 04/20/2019   [facility] repair - PPV/MP - Dr. Bernarda Caffey  . GALLBLADDER SURGERY    . GAS/FLUID EXCHANGE Right 04/20/2019   Procedure: Gas/Fluid Exchange;  Surgeon: Bernarda Caffey, MD;  Location: [facility];  Service: Ophthalmology;  Laterality: Right;  . HEMOSTASIS CLIP PLACEMENT  11/17/2019   Procedure: HEMOSTASIS CLIP PLACEMENT;  Surgeon: [name], DO;  [facility];  Service: Endoscopy;;  . PARTIAL COLECTOMY N/A 12/25/2019   Procedure: PARTIAL COLECTOMY;  Surgeon: Aviva Signs, MD;  Location[facility];  Service: General;  Laterality: N/A;  . PHOTOCOAGULATION WITH LASER Right 04/20/2019   Procedure: Photocoagulation With Laser;  Surgeon: Bernarda Caffey, MD;  Location: [facility];  Service: Ophthalmology;  Laterality: Right;  . POLYPECTOMY  11/17/2019   Procedure: POLYPECTOMY;  Surgeon: [name], DO;  [facility];  Service: Endoscopy;;  . TUBAL LIGATION      FAMILY HISTORY Family History  Problem Relation Age of Onset  . Heart attack Maternal Grandmother   . Cancer Maternal Grandfather   . Cancer Father        lung  . Other Mother        irregular heart beat  . Hypertension Mother   . Diabetes Brother   . Hypertension Brother   . High Cholesterol Brother   . Atrial fibrillation Brother   . Melanoma Brother        right ear  . Hypertension Sister   . High Cholesterol Sister   . Colon cancer Sister   . Rheum arthritis Sister   . Other Sister        irregular heart beat  . Hypertension Son   . Cancer Other        maternal side  . Stroke Other        maternal side    SOCIAL HISTORY Social History    Tobacco Use  . Smoking status: Never Smoker  . Smokeless tobacco: Never Used  Vaping Use  . Vaping Use: Never used  Substance Use Topics  . Alcohol use: No    Alcohol/week: 0.0 standard drinks  . Drug use: No         OPHTHALMIC EXAM:  Base Eye Exam    Visual Acuity (Snellen - Linear)      Right Left   Dist cc 20/20 -2 20/40 -1   Dist ph cc  20/25 -1   Correction: Glasses       Tonometry (  Tonopen, 7:49 AM)      Right Left   Pressure 15 16       Pupils      Dark Light Shape React APD   Right 3 2 Round Brisk None   Left 3 2 Round Brisk None       Visual Fields (Counting fingers)      Left Right    Full Full       Extraocular Movement      Right Left    Full, Ortho Full, Ortho       Neuro/Psych    Oriented x3: Yes   Mood/Affect: Normal       Dilation    Both eyes: 1.0% Mydriacyl, 2.5% Phenylephrine @ 7:49 AM        Slit Lamp and Fundus Exam    External Exam      Right Left   External Normal        Slit Lamp Exam      Right Left   Lids/Lashes Dermatochalasis - upper lid, Meibomian gland dysfunction Dermatochalasis - upper lid, Meibomian gland dysfunction   Conjunctiva/Sclera White and quiet White and quiet   Cornea 1+ inferior Punctate epithelial erosions inferiorly, well healed temporal cataract wounds, mild tear film debris 1+Punctate epithelial erosions, Debris in tear film   Anterior Chamber Deep and quiet Deep and quiet   Iris Round and moderately dilated Round and dilated   Lens PC IOL in good position with open PC 2+ Nuclear sclerosis, 2+ Cortical cataract   Vitreous post vitrectomy, clear Vitreous syneresis       Fundus Exam      Right Left   Disc mild Pallor, Sharp rim, mild PPA Pink and Sharp, mild PPA/PPP   C/D Ratio 0.5 0.4   Macula flat; mac hole closed, Blunted foveal reflex, mild RPE mottling, No heme or edema Flat, blunted foveal reflex, mild RPE mottling, central cystic changes, No heme    Vessels attenuated, mild  tortuousity attenuated, mild tortuousity   Periphery attached, good 360 laser changes Attached, no heme          IMAGING AND PROCEDURES  Imaging and Procedures for _0 @  OCT, Retina - OU - Both Eyes       Right Eye Quality was good. Central Foveal Thickness: 326. Progression has been stable. Findings include no SRF, abnormal foveal contour, no IRF (Mac hole closed, irregular inner retinal surface ).   Left Eye Quality was good. Central Foveal Thickness: 321. Progression has worsened. Findings include no SRF, abnormal foveal contour, vitreous traction, intraretinal fluid (Interval progression of VMT with central cystic changes).   Notes *Images captured and stored on drive  Diagnosis / Impression:  OD: Mac hole closed; abnormal foveal profile; no IRF/SRF OS: Interval progression of VMT with increased central cystic changes  Clinical management:  See below  Abbreviations: NFP - Normal foveal profile. CME - cystoid macular edema. PED - pigment epithelial detachment. IRF - intraretinal fluid. SRF - subretinal fluid. EZ - ellipsoid zone. ERM - epiretinal membrane. ORA - outer retinal atrophy. ORT - outer retinal tubulation. SRHM - subretinal hyper-reflective material                 ASSESSMENT/PLAN:    ICD-10-CM   1. Macular hole of right eye  H35.341   2. Retinal edema  H35.81 OCT, Retina - OU - Both Eyes  3. Essential hypertension  I10   4. Hypertensive retinopathy of both eyes  H35.033  5. Combined forms of age-related cataract of left eye  H25.812   6. Pseudophakia  Z96.1   7. Vitreomacular traction, left  H43.822     1,2. Full thickness macular hole OD  - s/p PPV/TissueBlue stain/MP/14% C3F8 OD, 03.11.21             - doing well             - mac hole closed  - BCVA now 20/20 OD post cataract surgery  - monitor  3,4. Hypertensive retinopathy OU  - discussed importance of tight BP control  - monitor  5. Mixed form age related cataract OS  - The  symptoms of cataract, surgical options, and treatments and risks were discussed with patient.  - discussed diagnosis and progression  - under the expert management of Dr Shirleen Schirmer  6. Pseudophakia OD  - s/p CE/IOL OD (Dr. Shirleen Schirmer, 07.08.21)  - IOL in excellent position, doing well  - monitor  7. VMT OS  - interval progression of central VMT with increased central cystic changes  - f/u 3-4 months, sooner prn -- DFE, OCT    Ophthalmic Meds Ordered this visit:  No orders of the defined types were placed in this encounter.      Return for f/u 3-4 months, VMT OS, DFE, OCT.  There are no Patient Instructions on file for this visit.   Explained the diagnoses, plan, and follow up with the patient and they expressed understanding.  Patient expressed understanding of the importance of proper follow up care.   This document serves as a record of services personally performed by Gardiner Sleeper, MD, PhD. It was created on their behalf by Roselee Nova, COMT. The creation of this record is the provider's dictation and/or activities during the visit.  Electronically signed by: Roselee Nova, COMT 03/13/20 8:40 AM   This document serves as a record of services personally performed by Gardiner Sleeper, MD, PhD. It was created on their behalf by San Jetty. Owens Shark, OA an ophthalmic technician. The creation of this record is the provider's dictation and/or activities during the visit.    Electronically signed by: San Jetty. Owens Shark, [place] 02.02.2022 8:40 AM  Gardiner Sleeper, M.D., Ph.D. Diseases & Surgery of the Retina and Vitreous [facility]  I have reviewed the above documentation for accuracy and completeness, and I agree with the above. Gardiner Sleeper, M.D., Ph.D. 03/13/20 8:40 AM   Abbreviations: M myopia (nearsighted); A astigmatism; H hyperopia (farsighted); P presbyopia; Mrx spectacle prescription;  CTL contact lenses; OD right eye; OS left eye; OU both eyes  XT  exotropia; ET esotropia; PEK punctate epithelial keratitis; PEE punctate epithelial erosions; DES dry eye syndrome; MGD meibomian gland dysfunction; ATs artificial tears; PFAT's preservative free artificial tears; [place] nuclear sclerotic cataract; PSC posterior subcapsular cataract; ERM epi-retinal membrane; PVD posterior vitreous detachment; RD retinal detachment; DM diabetes mellitus; DR diabetic retinopathy; NPDR non-proliferative diabetic retinopathy; PDR proliferative diabetic retinopathy; CSME clinically significant macular edema; DME diabetic macular edema; dbh dot blot hemorrhages; CWS cotton wool spot; POAG primary open angle glaucoma; C/D cup-to-disc ratio; HVF humphrey visual field; GVF goldmann visual field; OCT optical coherence tomography; IOP intraocular pressure; BRVO Branch retinal vein occlusion; CRVO central retinal vein occlusion; CRAO central retinal artery occlusion; BRAO branch retinal artery occlusion; RT retinal tear; SB scleral buckle; PPV pars plana vitrectomy; VH Vitreous hemorrhage; PRP panretinal laser photocoagulation; IVK intravitreal kenalog; VMT vitreomacular traction; MH Macular hole;  NVD neovascularization of the disc; NVE neovascularization elsewhere; AREDS age related eye disease study; ARMD age related macular degeneration; POAG primary open angle glaucoma; EBMD epithelial/anterior basement membrane dystrophy; ACIOL anterior chamber intraocular lens; IOL intraocular lens; PCIOL posterior chamber intraocular lens; Phaco/IOL phacoemulsification with intraocular lens placement; [place] photorefractive keratectomy; LASIK laser assisted in situ keratomileusis; HTN hypertension; DM diabetes mellitus; COPD chronic obstructive pulmonary disease

## 2020-03-13 ENCOUNTER — Encounter (INDEPENDENT_AMBULATORY_CARE_PROVIDER_SITE_OTHER): Payer: Self-pay | Admitting: Ophthalmology

## 2020-03-13 ENCOUNTER — Ambulatory Visit (INDEPENDENT_AMBULATORY_CARE_PROVIDER_SITE_OTHER): Payer: Medicare HMO | Admitting: Ophthalmology

## 2020-03-13 ENCOUNTER — Other Ambulatory Visit: Payer: Self-pay

## 2020-03-13 DIAGNOSIS — H35341 Macular cyst, hole, or pseudohole, right eye: Secondary | ICD-10-CM

## 2020-03-13 DIAGNOSIS — H35033 Hypertensive retinopathy, bilateral: Secondary | ICD-10-CM

## 2020-03-13 DIAGNOSIS — H43822 Vitreomacular adhesion, left eye: Secondary | ICD-10-CM

## 2020-03-13 DIAGNOSIS — Z961 Presence of intraocular lens: Secondary | ICD-10-CM

## 2020-03-13 DIAGNOSIS — H3581 Retinal edema: Secondary | ICD-10-CM

## 2020-03-13 DIAGNOSIS — I10 Essential (primary) hypertension: Secondary | ICD-10-CM

## 2020-03-13 DIAGNOSIS — H25812 Combined forms of age-related cataract, left eye: Secondary | ICD-10-CM

## 2020-06-05 NOTE — Progress Notes (Signed)
[facility] Note  06/10/2020     CHIEF COMPLAINT Patient presents for Retina Follow Up   HISTORY OF PRESENT ILLNESS: Catherine Carney is a 70 y.o. female who presents to the clinic today for:   HPI    Retina Follow Up    Patient presents with  Other.  In left eye.  This started 3 months ago.  I, the attending physician,  performed the HPI with the patient and updated documentation appropriately.          Comments    Patient here for 3 months retina follow up for VMT OS. Patient states vision about the same. No eye pain/=.       Last edited by Bernarda Caffey, MD on 06/10/2020 10:45 PM. (History)    pt states no new health concerns   Referring physician: Particia Nearing, [facility] [place] [address],  [place] [address]  HISTORICAL INFORMATION:   Selected notes from the MEDICAL RECORD NUMBER Referred by Dr Radford Pax for retina eval   CURRENT MEDICATIONS: Current Outpatient Medications (Ophthalmic Drugs)  Medication Sig  . ARTIFICIAL TEAR SOLUTION OP Place 1 drop into both eyes daily as needed (dry eyes).   No current facility-administered medications for this visit. (Ophthalmic Drugs)   Current Outpatient Medications (Other)  Medication Sig  . acetaminophen (TYLENOL) 500 MG tablet Take 500 mg by mouth every 6 (six) hours as needed (for pain.).   Marland Kitchen aspirin EC 81 MG tablet Take 81 mg by mouth daily.  . Calcium Carb-Cholecalciferol (CALCIUM 600+D) 600-800 MG-UNIT TABS Take 1 tablet by mouth daily.  . ciprofloxacin (CIPRO) 500 MG tablet Take 1 tablet (500 mg total) by mouth 2 (two) times daily.  . Coenzyme Q10 (COQ10) 100 MG CAPS Take 100 mg by mouth daily.  Marland Kitchen lisinopril (ZESTRIL) 20 MG tablet Take 20 mg by mouth daily.  . metoprolol succinate (TOPROL-XL) 25 MG 24 hr tablet Take 25 mg by mouth daily.   . Multiple Vitamin (MULTIVITAMIN WITH MINERALS) TABS tablet Take 1 tablet by mouth daily. Women's Multivitamin 50+  . Nutritional Supplement LIQD Take  1 Bottle by mouth daily. Immune max otc supplement  . nystatin cream (MYCOSTATIN) Apply 1 application topically 2 (two) times daily.  Ernestine Conrad 3-6-9 Fatty Acids (TRIPLE OMEGA-3-6-9 PO) Take 1 capsule by mouth daily.  Marland Kitchen omeprazole (PRILOSEC) 40 MG capsule Take 1 capsule (40 mg total) by mouth 2 (two) times daily before a meal.  . rosuvastatin (CRESTOR) 20 MG tablet Take 20 mg by mouth at bedtime.  . traMADol (ULTRAM) 50 MG tablet Take 1 tablet (50 mg total) by mouth every 6 (six) hours as needed.  . vitamin B-12 (CYANOCOBALAMIN) 500 MCG tablet Take 500 mcg by mouth daily.   No current facility-administered medications for this visit. (Other)      REVIEW OF SYSTEMS: ROS    Positive for: Gastrointestinal, Musculoskeletal, Eyes   Negative for: Constitutional, Neurological, Skin, Genitourinary, HENT, Endocrine, Cardiovascular, Respiratory, Psychiatric, Allergic/Imm, Heme/Lymph   Last edited by Theodore Demark, COA on 06/10/2020  9:04 AM. (History)       ALLERGIES No Known Allergies  PAST MEDICAL HISTORY Past Medical History:  Diagnosis Date  . Cataract    Combined form OU  . Family history of adverse reaction to anesthesia    sister had PONV  . Female bladder prolapse   . Hyperlipidemia   . Hypertension   . Hypertensive retinopathy    OU  .  Macular hole of right eye   . Osteoarthritis    Past Surgical History:  Procedure Laterality Date  . [facility] VITRECTOMY WITH 20 GAUGE MVR PORT FOR MACULAR HOLE Right 04/20/2019   Procedure: 25 GAUGE PARS PLANA VITRECTOMY WITH 20 GAUGE MVR PORT FOR MACULAR HOLE WITH MEMBRANE PEEL;  Surgeon: Bernarda Caffey, MD;  Location: [facility];  Service: Ophthalmology;  Laterality: Right;  . BIOPSY  11/17/2019   Procedure: BIOPSY;  Surgeon: [name], DO;  [facility];  Service: Endoscopy;;  . CATARACT EXTRACTION Right   . CHOLECYSTECTOMY    . COLONOSCOPY WITH PROPOFOL N/A 11/17/2019   Procedure: COLONOSCOPY WITH PROPOFOL;   Surgeon: [name], DO;  [facility];  Service: Endoscopy;  Laterality: N/A;  11:15am  . ESOPHAGOGASTRODUODENOSCOPY (EGD) WITH PROPOFOL N/A 11/17/2019   Procedure: ESOPHAGOGASTRODUODENOSCOPY (EGD) WITH PROPOFOL;  Surgeon: [name], DO;  [facility];  Service: Endoscopy;  Laterality: N/A;  . EYE SURGERY Right 04/20/2019   [facility] repair - PPV/MP - Dr. Bernarda Caffey  . GALLBLADDER SURGERY    . GAS/FLUID EXCHANGE Right 04/20/2019   Procedure: Gas/Fluid Exchange;  Surgeon: Bernarda Caffey, MD;  Location: [facility];  Service: Ophthalmology;  Laterality: Right;  . HEMOSTASIS CLIP PLACEMENT  11/17/2019   Procedure: HEMOSTASIS CLIP PLACEMENT;  Surgeon: [name], DO;  [facility];  Service: Endoscopy;;  . PARTIAL COLECTOMY N/A 12/25/2019   Procedure: PARTIAL COLECTOMY;  Surgeon: Aviva Signs, MD;  Location[facility];  Service: General;  Laterality: N/A;  . PHOTOCOAGULATION WITH LASER Right 04/20/2019   Procedure: Photocoagulation With Laser;  Surgeon: Bernarda Caffey, MD;  Location: [facility];  Service: Ophthalmology;  Laterality: Right;  . POLYPECTOMY  11/17/2019   Procedure: POLYPECTOMY;  Surgeon: [name], DO;  [facility];  Service: Endoscopy;;  . TUBAL LIGATION      FAMILY HISTORY Family History  Problem Relation Age of Onset  . Heart attack Maternal Grandmother   . Cancer Maternal Grandfather   . Cancer Father        lung  . Other Mother        irregular heart beat  . Hypertension Mother   . Diabetes Brother   . Hypertension Brother   . High Cholesterol Brother   . Atrial fibrillation Brother   . Melanoma Brother        right ear  . Hypertension Sister   . High Cholesterol Sister   . Colon cancer Sister   . Rheum arthritis Sister   . Other Sister        irregular heart beat  . Hypertension Son   . Cancer Other        maternal side  . Stroke Other        maternal side    SOCIAL HISTORY Social History   Tobacco  Use  . Smoking status: Never Smoker  . Smokeless tobacco: Never Used  Vaping Use  . Vaping Use: Never used  Substance Use Topics  . Alcohol use: No    Alcohol/week: 0.0 standard drinks  . Drug use: No         OPHTHALMIC EXAM:  Base Eye Exam    Visual Acuity (Snellen - Linear)      Right Left   Dist cc 20/20 -2 20/20 -1   Correction: Glasses       Tonometry (Tonopen, 9:02 AM)      Right Left  Pressure 11 13       Pupils      Dark Light Shape React APD   Right 3 2 Round Brisk None   Left 3 2 Round Brisk None       Visual Fields (Counting fingers)      Left Right    Full Full       Extraocular Movement      Right Left    Full, Ortho Full, Ortho       Neuro/Psych    Oriented x3: Yes   Mood/Affect: Normal       Dilation    Both eyes: 1.0% Mydriacyl, 2.5% Phenylephrine @ 9:01 AM        Slit Lamp and Fundus Exam    External Exam      Right Left   External Normal        Slit Lamp Exam      Right Left   Lids/Lashes Dermatochalasis - upper lid, Meibomian gland dysfunction Dermatochalasis - upper lid, Meibomian gland dysfunction   Conjunctiva/Sclera White and quiet White and quiet   Cornea 1-2+ inferior Punctate epithelial erosions inferiorly, well healed temporal cataract wounds, mild tear film debris Trace Punctate epithelial erosions, trace Debris in tear film   Anterior Chamber Deep and quiet Deep and quiet   Iris Round and moderately dilated Round and dilated   Lens PC IOL in good position with open PC 2+ Nuclear sclerosis, 2+ Cortical cataract   Vitreous post vitrectomy, clear Vitreous syneresis       Fundus Exam      Right Left   Disc mild Pallor, Sharp rim, mild PPA/PPP Pink and Sharp, mild PPA/PPP   C/D Ratio 0.6 0.4   Macula flat; mac hole closed, Blunted foveal reflex, mild RPE mottling, No heme or edema Flat, good foveal reflex, mild RPE mottling, central cystic changes - improved, No heme or edema   Vessels attenuated, mild tortuousity  attenuated, mild tortuousity, mild AV crossing changes   Periphery attached, good 360 laser changes Attached, no heme          IMAGING AND PROCEDURES  Imaging and Procedures for _0 @  OCT, Retina - OU - Both Eyes       Right Eye Quality was good. Central Foveal Thickness: 335. Progression has been stable. Findings include no SRF, abnormal foveal contour, no IRF (Mac hole closed, irregular inner retinal surface).   Left Eye Quality was good. Central Foveal Thickness: 278. Progression has improved. Findings include no SRF, abnormal foveal contour, vitreous traction, intraretinal fluid (Interval release of central VMT and interval improvement in central cystic changes -- now partial PVD).   Notes *Images captured and stored on drive  Diagnosis / Impression:  OD: Mac hole closed; abnormal foveal profile; no IRF/SRF OS: interval release of central VMT and interval improvement in central cystic changes -- now partial PVD  Clinical management:  See below  Abbreviations: NFP - Normal foveal profile. CME - cystoid macular edema. PED - pigment epithelial detachment. IRF - intraretinal fluid. SRF - subretinal fluid. EZ - ellipsoid zone. ERM - epiretinal membrane. ORA - outer retinal atrophy. ORT - outer retinal tubulation. SRHM - subretinal hyper-reflective material                 ASSESSMENT/PLAN:    ICD-10-CM   1. Macular hole of right eye  H35.341   2. Retinal edema  H35.81 OCT, Retina - OU - Both Eyes  3. Essential hypertension  I10  4. Hypertensive retinopathy of both eyes  H35.033   5. Combined forms of age-related cataract of left eye  H25.812   6. Pseudophakia  Z96.1   7. Vitreomacular traction, left  H43.822   8. Combined forms of age-related cataract of both eyes  H25.813     1,2. Full thickness macular hole OD  - s/p PPV/TissueBlue stain/MP/14% C3F8 OD, 03.11.21             - doing well             - mac hole closed  - BCVA now 20/20 OD post cataract  surgery  - monitor  3,4. Hypertensive retinopathy OU  - discussed importance of tight BP control  - monitor  5. 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  - discussed diagnosis and progression  - under the expert management of Dr Shirleen Schirmer  6. Pseudophakia OD  - s/p CE/IOL OD (Dr. Shirleen Schirmer, 07.08.21)  - IOL in excellent position, doing well  - monitor  7. VMT OS -- resolved  - OCT 5.2.22 shows interval release of central VMT and interval improvement in central cystic changes -- now partial PVD  - f/u 1 yr, sooner prn -- DFE, OCT    Ophthalmic Meds Ordered this visit:  No orders of the defined types were placed in this encounter.      Return in about 1 year (around 06/10/2021) for f/u Faith OD / VMT OS, DFE, OCT.  There are no Patient Instructions on file for this visit.  This document serves as a record of services personally performed by Gardiner Sleeper, MD, PhD. It was created on their behalf by Leeann Must, [place], an ophthalmic technician. The creation of this record is the provider's dictation and/or activities during the visit.    Electronically signed by: Leeann Must, COA _0 @ 10:48 PM  Gardiner Sleeper, M.D., Ph.D. Diseases & Surgery of the Retina and [place] 06/10/2020   I have reviewed the above documentation for accuracy and completeness, and I agree with the above. Gardiner Sleeper, M.D., Ph.D. 06/10/20 10:48 PM  Abbreviations: M myopia (nearsighted); A astigmatism; H hyperopia (farsighted); P presbyopia; Mrx spectacle prescription;  CTL contact lenses; OD right eye; OS left eye; OU both eyes  XT exotropia; ET esotropia; PEK punctate epithelial keratitis; PEE punctate epithelial erosions; DES dry eye syndrome; MGD meibomian gland dysfunction; ATs artificial tears; PFAT's preservative free artificial tears; [place] nuclear sclerotic  cataract; PSC posterior subcapsular cataract; ERM epi-retinal membrane; PVD posterior vitreous detachment; RD retinal detachment; DM diabetes mellitus; DR diabetic retinopathy; NPDR non-proliferative diabetic retinopathy; PDR proliferative diabetic retinopathy; CSME clinically significant macular edema; DME diabetic macular edema; dbh dot blot hemorrhages; CWS cotton wool spot; POAG primary open angle glaucoma; C/D cup-to-disc ratio; HVF humphrey visual field; GVF goldmann visual field; OCT optical coherence tomography; IOP intraocular pressure; BRVO Branch retinal vein occlusion; CRVO central retinal vein occlusion; CRAO central retinal artery occlusion; BRAO branch retinal artery occlusion; RT retinal tear; SB scleral buckle; PPV pars plana vitrectomy; VH Vitreous hemorrhage; PRP panretinal laser photocoagulation; IVK intravitreal kenalog; VMT vitreomacular traction; MH Macular hole;  NVD neovascularization of the disc; NVE neovascularization elsewhere; AREDS age related eye disease study; ARMD age related macular degeneration; POAG primary open angle glaucoma; EBMD epithelial/anterior basement membrane dystrophy; ACIOL anterior chamber intraocular lens; IOL intraocular lens; PCIOL posterior chamber intraocular  lens; Phaco/IOL phacoemulsification with intraocular lens placement; [place] photorefractive keratectomy; LASIK laser assisted in situ keratomileusis; HTN hypertension; DM diabetes mellitus; COPD chronic obstructive pulmonary disease

## 2020-06-10 ENCOUNTER — Encounter (INDEPENDENT_AMBULATORY_CARE_PROVIDER_SITE_OTHER): Payer: Self-pay | Admitting: Ophthalmology

## 2020-06-10 ENCOUNTER — Other Ambulatory Visit: Payer: Self-pay

## 2020-06-10 ENCOUNTER — Ambulatory Visit (INDEPENDENT_AMBULATORY_CARE_PROVIDER_SITE_OTHER): Payer: Medicare HMO | Admitting: Ophthalmology

## 2020-06-10 DIAGNOSIS — H35033 Hypertensive retinopathy, bilateral: Secondary | ICD-10-CM | POA: Diagnosis not present

## 2020-06-10 DIAGNOSIS — I10 Essential (primary) hypertension: Secondary | ICD-10-CM

## 2020-06-10 DIAGNOSIS — H3581 Retinal edema: Secondary | ICD-10-CM | POA: Diagnosis not present

## 2020-06-10 DIAGNOSIS — H43822 Vitreomacular adhesion, left eye: Secondary | ICD-10-CM

## 2020-06-10 DIAGNOSIS — H25812 Combined forms of age-related cataract, left eye: Secondary | ICD-10-CM

## 2020-06-10 DIAGNOSIS — H35341 Macular cyst, hole, or pseudohole, right eye: Secondary | ICD-10-CM

## 2020-06-10 DIAGNOSIS — H25813 Combined forms of age-related cataract, bilateral: Secondary | ICD-10-CM

## 2020-06-10 DIAGNOSIS — Z961 Presence of intraocular lens: Secondary | ICD-10-CM

## 2020-07-05 ENCOUNTER — Ambulatory Visit: Payer: Medicare HMO | Admitting: Obstetrics & Gynecology

## 2020-07-17 ENCOUNTER — Ambulatory Visit: Payer: Medicare HMO | Admitting: Adult Health

## 2020-07-17 ENCOUNTER — Other Ambulatory Visit: Payer: Self-pay

## 2020-07-17 ENCOUNTER — Encounter: Payer: Self-pay | Admitting: Adult Health

## 2020-07-17 VITALS — BP 131/72 | HR 72 | Ht 66.0 in | Wt 189.0 lb

## 2020-07-17 DIAGNOSIS — Z4689 Encounter for fitting and adjustment of other specified devices: Secondary | ICD-10-CM

## 2020-07-17 DIAGNOSIS — N362 Urethral caruncle: Secondary | ICD-10-CM | POA: Diagnosis not present

## 2020-07-17 DIAGNOSIS — N813 Complete uterovaginal prolapse: Secondary | ICD-10-CM

## 2020-07-17 NOTE — Progress Notes (Signed)
  Subjective:     Patient ID: ARAH ARO, female   DOB: 04/28/1950, 70 y.o.   MRN: [id number]  HPI Catherine Carney is a 70 year old white female,married, PM in for pessary maintenance has seen some blood at times, like a spot. PCP is Dr Melford Aase.  Review of Systems For pessary maintenance Has seen some blood   Reviewed past medical,surgical, social and family history. Reviewed medications and allergies.  Objective:   Physical Exam BP 131/72 (BP Location: Left Arm, Patient Position: Sitting, Cuff Size: Normal)   Pulse 72   Ht 5\' 6"  (1.676 m)   Wt 189 lb (85.7 kg)   BMI 30.51 kg/m    Skin warm and dry.Pelvic: external genitalia is normal in appearance, has red rash near buttocks, vagina: pessary easily removed, has creamy vaginal discharge, washed with soap and water and dried,no lesions or irritations noted, pessary easily reinserted,urethra has caruncle, showed her with a mirror, cervix:smooth, and prolased, uterus: normal size, shape and contour, non tender, no masses felt, adnexa: no masses or tenderness noted. Bladder is non tender and no masses felt.    [facility] - 07/17/20 1407      Pregnancy Intention Screening   Does the patient want to become pregnant in the next year? No    Does the patient's partner want to become pregnant in the next year? No    Would the patient like to discuss contraceptive options today? No      Contraception Wrap Up   Current Method Female Sterilization    End Method Female Sterilization    Contraception Counseling Provided No         Examination chaperoned by Celene Squibb LPN  Assessment:     1. Encounter for pessary maintenance Milex ring with support #5  2. Uterine procidentia   3. Urethral caruncle     Plan:     Follow up in 4 months for pessary maintenance    Can use Aquaphor mixed with Desitin for peri area, and get pantie liner with out wings

## 2020-07-23 ENCOUNTER — Other Ambulatory Visit: Payer: Self-pay | Admitting: Internal Medicine

## 2020-08-22 ENCOUNTER — Other Ambulatory Visit (HOSPITAL_COMMUNITY): Payer: Self-pay | Admitting: Family Medicine

## 2020-08-22 DIAGNOSIS — Z1231 Encounter for screening mammogram for malignant neoplasm of breast: Secondary | ICD-10-CM

## 2020-08-26 ENCOUNTER — Other Ambulatory Visit (HOSPITAL_COMMUNITY): Payer: Self-pay | Admitting: Family Medicine

## 2020-08-26 DIAGNOSIS — Z780 Asymptomatic menopausal state: Secondary | ICD-10-CM

## 2020-08-28 ENCOUNTER — Ambulatory Visit (HOSPITAL_COMMUNITY)
Admission: RE | Admit: 2020-08-28 | Discharge: 2020-08-28 | Disposition: A | Discharge status: Home / Self Care | Payer: Medicare HMO | Source: Ambulatory Visit | Attending: Family Medicine | Admitting: Family Medicine

## 2020-08-28 ENCOUNTER — Other Ambulatory Visit: Payer: Self-pay

## 2020-08-28 ENCOUNTER — Ambulatory Visit (HOSPITAL_COMMUNITY): Payer: Medicare HMO

## 2020-08-28 DIAGNOSIS — Z1231 Encounter for screening mammogram for malignant neoplasm of breast: Secondary | ICD-10-CM

## 2020-08-28 DIAGNOSIS — Z780 Asymptomatic menopausal state: Secondary | ICD-10-CM | POA: Diagnosis not present

## 2020-11-11 ENCOUNTER — Encounter: Payer: Self-pay | Admitting: Adult Health

## 2020-11-11 ENCOUNTER — Other Ambulatory Visit: Payer: Self-pay

## 2020-11-11 ENCOUNTER — Ambulatory Visit: Payer: Medicare HMO | Admitting: Adult Health

## 2020-11-11 VITALS — BP 161/89 | HR 64 | Ht 66.0 in | Wt 189.0 lb

## 2020-11-11 DIAGNOSIS — Z4689 Encounter for fitting and adjustment of other specified devices: Secondary | ICD-10-CM

## 2020-11-11 DIAGNOSIS — N813 Complete uterovaginal prolapse: Secondary | ICD-10-CM | POA: Diagnosis not present

## 2020-11-11 NOTE — Progress Notes (Signed)
  Subjective:     Patient ID: Catherine Carney, female   DOB: 1950/09/09, 70 y.o.   MRN: [id number]  HPI Catherine Carney is a 70 year old white female,married, PM in for pessary maintenance.   PCP is Dr Melford Aase. Review of Systems For pessary maintenance Has discharge at times, some blood  Feels like comes down some at times  Reviewed past medical,surgical, social and family history. Reviewed medications and allergies.     Objective:   Physical Exam BP (!) 161/89 (BP Location: Left Arm, Patient Position: Sitting, Cuff Size: Normal)   Pulse 64   Ht 5\' 6"  (1.676 m)   Wt 189 lb (85.7 kg)   BMI 30.51 kg/m     Skin warm and dry.Pelvic: external genitalia is normal in appearance no lesions, vagina:pessary removed and washed with soap and water and dried, tan discharge without odor, no lesions seen,urethra has caruncle, cervix:smooth, uterus: normal size, shape and contour, non tender, no masses felt, +prolapse,adnexa: no masses or tenderness noted. Bladder is non tender and no masses felt. #5 ring pessary easily reinserted.  [facility] - 11/11/20 0913       Pregnancy Intention Screening   Does the patient want to become pregnant in the next year? N/A    Does the patient's partner want to become pregnant in the next year? N/A    Would the patient like to discuss contraceptive options today? N/A      Contraception Wrap Up   Current Method Female Sterilization   PM   End Method Female Sterilization   PM   Contraception Counseling Provided No            Examination chaperoned by Estill Bamberg LPN Assessment:     1. Encounter for pessary maintenance Milex ring with support #5  2. Uterine procidentia     Plan:     Will Follow up in 3 months for pessary maintenance at her request

## 2020-11-29 ENCOUNTER — Encounter: Payer: Self-pay | Admitting: *Deleted

## 2020-12-17 ENCOUNTER — Encounter: Payer: Self-pay | Admitting: Internal Medicine

## 2021-02-11 ENCOUNTER — Ambulatory Visit: Payer: Medicare HMO | Admitting: Adult Health

## 2021-02-11 ENCOUNTER — Other Ambulatory Visit: Payer: Self-pay | Admitting: Gastroenterology

## 2021-02-12 ENCOUNTER — Other Ambulatory Visit: Payer: Self-pay

## 2021-02-12 ENCOUNTER — Encounter: Payer: Self-pay | Admitting: Adult Health

## 2021-02-12 ENCOUNTER — Ambulatory Visit: Payer: Medicare HMO | Admitting: Adult Health

## 2021-02-12 VITALS — BP 153/69 | HR 64 | Ht 66.0 in | Wt 185.6 lb

## 2021-02-12 DIAGNOSIS — Z4689 Encounter for fitting and adjustment of other specified devices: Secondary | ICD-10-CM

## 2021-02-12 DIAGNOSIS — N813 Complete uterovaginal prolapse: Secondary | ICD-10-CM

## 2021-02-12 DIAGNOSIS — N362 Urethral caruncle: Secondary | ICD-10-CM

## 2021-02-12 NOTE — Progress Notes (Signed)
°  Subjective:     Patient ID: Catherine Carney, female   DOB: 1950/10/16, 71 y.o.   MRN: [id number]  HPI Catherine Carney is a 71 year old white female, married, PM in for pessary maintenance.  PCP is Dr Melford Aase   Review of Systems Denies any discharge, blood or odor.  Reviewed past medical,surgical, social and family history. Reviewed medications and allergies.     Objective:   Physical Exam BP (!) 153/69 (BP Location: Right Arm, Patient Position: Sitting, Cuff Size: Normal)    Pulse 64    Ht 5\' 6"  (1.676 m)    Wt 185 lb 9.6 oz (84.2 kg)    BMI 29.96 kg/m      Skin warm and dry.Pelvic: external genitalia is normal in appearance no lesions, vagina: pessary easily removed, washed with soap and water and dried,no lesion in vagina or irritation, pessary easily inserted,urethra has caruncle, cervix:smooth,uterus: normal size, shape and contour, non tender, no masses felt,+prolapse,  adnexa: no masses or tenderness noted. Bladder is non tender and no masses felt.  Examination chaperoned by Catherine Pupa LPN     1. Encounter for pessary maintenance,milex ring #5 with support  2. Uterine procidentia  3. Urethral caruncle     Plan:     Follow up in 3 months for pessary maintenance

## 2021-02-13 NOTE — Telephone Encounter (Signed)
Please verify if patient is taking omeprazole once or twice daily.  I am going to send in a limited refill once we have this information.  She needs to keep upcoming appointment for further refills.

## 2021-02-14 NOTE — Telephone Encounter (Signed)
Spoke to pt, she informed me that she is only taking 1 Omeprazole daily. Informed her to keep up coming OV for further refills.

## 2021-02-17 NOTE — Telephone Encounter (Signed)
Noted.  Rx sent.

## 2021-04-12 ENCOUNTER — Other Ambulatory Visit: Payer: Self-pay | Admitting: Gastroenterology

## 2021-04-14 NOTE — Telephone Encounter (Signed)
Noted  

## 2021-04-28 ENCOUNTER — Encounter: Payer: Self-pay | Admitting: Gastroenterology

## 2021-04-28 NOTE — Progress Notes (Signed)
? ? ?Referring Provider: Chesley Noon, MD ?Primary Care Physician:  Chesley Noon, MD ?Primary GI Physician: Dr. Abbey Chatters ? ?Chief Complaint  ?Patient presents with  ? Colonoscopy  ? ? ?HPI:   ?Catherine Carney is a 71 y.o. female presenting today with a history of multiple adenomatous colon polyps, reflux esophagitis, dysphagia, presenting today to discuss scheduling surveillance colonoscopy. ? ?Colonoscopy October 2021 with nonbleeding internal hemorrhoids, 13 mm polyp in the cecum resected and retrieved and clip placed, 11 mm polyp at the ileocecal valve resected and retrieved and 2 clips placed, multiple large ascending unresectable colon polyps left behind.  Recommended referral to surgery to discuss right hemicolectomy and follow-up colonoscopy in 1 year.  Pathology revealed tubular adenomas. ? ?She underwent partial colectomy 12/25/2019 with removal of section just proximal to terminal ileum to proximal transverse colon s/p side-to-side ileocolonic anastomosis.  Pathology revealed sessile serrated polyp with focal low-grade cytologic dysplasia, other sessile serial polyps without high-grade dysplasia, tubular adenomas without high-grade dysplasia or carcinoma, unremarkable appendix, 18 lymph nodes negative. ? ?Today:  ?Doing well overall.  Reports she had some loose stools after she had the partial colectomy, but this has resolved.  Denies constipation, diarrhea, BRBPR, melena, abdominal pain, unintentional weight loss. ? ?GERD is well controlled on omeprazole 40 mg daily. No dysphagia, nausea, or vomiting.  ? ? ?Past Medical History:  ?Diagnosis Date  ? Cataract   ? Combined form OU  ? Family history of adverse reaction to anesthesia   ? sister had PONV  ? Female bladder prolapse   ? Hyperlipidemia   ? Hypertension   ? Hypertensive retinopathy   ? OU  ? Macular hole of right eye   ? Osteoarthritis   ? ? ?Past Surgical History:  ?Procedure Laterality Date  ? 25 GAUGE PARS PLANA VITRECTOMY WITH 20 GAUGE  MVR PORT FOR MACULAR HOLE Right 04/20/2019  ? Procedure: 23 GAUGE PARS PLANA VITRECTOMY WITH 20 GAUGE MVR PORT FOR MACULAR HOLE WITH MEMBRANE PEEL;  Surgeon: Bernarda Caffey, MD;  Location: [facility];  Service: Ophthalmology;  Laterality: Right;  ? BIOPSY  11/17/2019  ? Procedure: BIOPSY;  Surgeon: [name], DO;  [facility];  Service: Endoscopy;;  ? CATARACT EXTRACTION Right   ? CHOLECYSTECTOMY    ? COLONOSCOPY WITH PROPOFOL N/A 11/17/2019  ? Surgeon: [name], DO; nonbleeding internal hemorrhoids, 13 mm tubular adenoma in the cecum, 11 mm tubular adenoma at the ileocecal valve, multiple large ascending unresectable colon polyps left behind. Referred to general suregery for right hemicolectomy. Repeat TCS in 1 year.  ? ESOPHAGOGASTRODUODENOSCOPY (EGD) WITH PROPOFOL N/A 11/17/2019  ? Surgeon: [name], DO;  small hiatal hernia, grade a reflux esophagitis, gastritis biopsied, normal examined duodenum.  Pathology with mild nonspecific reactive gastropathy, negative for H. pylori.  ? EYE SURGERY Right 04/20/2019  ? Strang repair - PPV/MP - Dr. Bernarda Caffey  ? GALLBLADDER SURGERY    ? GAS/FLUID EXCHANGE Right 04/20/2019  ? Procedure: Gas/Fluid Exchange;  Surgeon: Bernarda Caffey, MD;  Location: [place];  Service: Ophthalmology;  Laterality: Right;  ? HEMOSTASIS CLIP PLACEMENT  11/17/2019  ? Procedure: HEMOSTASIS CLIP PLACEMENT;  Surgeon: [name], DO;  [facility];  Service: Endoscopy;;  ? PARTIAL COLECTOMY N/A 12/25/2019  ? Procedure: PARTIAL COLECTOMY;  Surgeon: Aviva Signs, MD;  [facility]: [facility];  Service: General;  Laterality: N/A;  ? PHOTOCOAGULATION WITH LASER Right 04/20/2019  ? Procedure: Photocoagulation With Laser;  Surgeon:  Bernarda Caffey, MD;  Location: [facility];  Service: Ophthalmology;  Laterality: Right;  ? POLYPECTOMY  11/17/2019  ? Procedure: POLYPECTOMY;  Surgeon: [name], DO;  [facility];  Service: Endoscopy;;  ? TUBAL LIGATION     ? ? ?Current Outpatient Medications  ?Medication Sig Dispense Refill  ? acetaminophen (TYLENOL) 500 MG tablet Take 500 mg by mouth every 6 (six) hours as needed (for pain.).     ? ARTIFICIAL TEAR SOLUTION OP Place 1 drop into both eyes daily as needed (dry eyes).    ? aspirin EC 81 MG tablet Take 81 mg by mouth daily.    ? Calcium Carb-Cholecalciferol (CALCIUM 600+D) 600-800 MG-UNIT TABS Take 1 tablet by mouth daily.    ? Coenzyme Q10 (COQ10) 100 MG CAPS Take 100 mg by mouth daily.    ? metoprolol succinate (TOPROL-XL) 25 MG 24 hr tablet Take 25 mg by mouth daily.     ? Multiple Vitamin (MULTIVITAMIN WITH MINERALS) TABS tablet Take 1 tablet by mouth daily. Women's Multivitamin 50+    ? nystatin cream (MYCOSTATIN) Apply 1 application topically 2 (two) times daily.    ? Omega 3-6-9 Fatty Acids (TRIPLE OMEGA-3-6-9 PO) Take 1 capsule by mouth daily.    ? omeprazole (PRILOSEC) 40 MG capsule TAKE 1 CAPSULE BY MOUTH ONCE DAILY BEFORE BREAKFAST 60 capsule 0  ? rosuvastatin (CRESTOR) 20 MG tablet Take 20 mg by mouth at bedtime.    ? valsartan (DIOVAN) 320 MG tablet Take 320 mg by mouth daily.    ? vitamin B-12 (CYANOCOBALAMIN) 500 MCG tablet Take 500 mcg by mouth daily.    ? ?No current facility-administered medications for this visit.  ? ? ?Allergies as of 04/30/2021  ? (No Known Allergies)  ? ? ?Family History  ?Problem Relation Age of Onset  ? Other Mother   ?     irregular heart beat  ? Hypertension Mother   ? Cancer Father   ?     lung  ? Hypertension Sister   ? High Cholesterol Sister   ? Colon cancer Sister 36  ? Rheum arthritis Sister   ? Other Sister   ?     irregular heart beat  ? Diabetes Brother   ? Hypertension Brother   ? High Cholesterol Brother   ? Atrial fibrillation Brother   ? Melanoma Brother   ?     right ear  ? Heart attack Maternal Grandmother   ? Cancer Maternal Grandfather   ? Hypertension Son   ? Cancer Other   ?     maternal side  ? Stroke Other   ?     maternal side  ? Colon cancer Maternal Uncle    ? Colon cancer Maternal Aunt   ? ? ?Social History  ? ?Socioeconomic History  ? Marital status: Married  ?  Spouse name: Not on file  ? Number of children: Not on file  ? Years of education: Not on file  ? Highest education level: Not on file  ?Occupational History  ? Not on file  ?Tobacco Use  ? Smoking status: Never  ? Smokeless tobacco: Never  ?Vaping Use  ? Vaping Use: Never used  ?Substance and Sexual Activity  ? Alcohol use: No  ?  Alcohol/week: 0.0 standard drinks  ? Drug use: No  ? Sexual activity: Not Currently  ?  Birth control/protection: Surgical, Post-menopausal  ?  Comment: tubal  ?Other Topics Concern  ? Not on file  ?  Social History Narrative  ? Not on file  ? ?Social Determinants of Health  ? ?Financial Resource Strain: Not on file  ?Food Insecurity: Not on file  ?Transportation Needs: Not on file  ?Physical Activity: Not on file  ?Stress: Not on file  ?Social Connections: Not on file  ? ? ?Review of Systems: ?Gen: Denies fever, chills, cold or flulike symptoms, presyncope, syncope. ?CV: Denies chest pain, palpitations. ?Resp: Denies dyspnea or cough. ?GI: See HPI ?Heme: See HPI ? ?Physical Exam: ?BP (!) 154/75   Pulse 66   Temp (!) 97.5 ?F (36.4 ?C) (Temporal)   Ht '5\' 6"'$  (1.676 m)   Wt 192 lb 3.2 oz (87.2 kg)   BMI 31.02 kg/m?  ?General:   Alert and oriented. No distress noted. Pleasant and cooperative.  ?Head:  Normocephalic and atraumatic. ?Eyes:  Conjuctiva clear without scleral icterus. ?Heart:  S1, S2 present without murmurs appreciated. ?Lungs:  Clear to auscultation bilaterally. No wheezes, rales, or rhonchi. No distress.  ?Abdomen:  +BS, soft, non-tender and non-distended. No rebound or guarding. No HSM or masses noted. ?Msk:  Symmetrical without gross deformities. Normal posture. ?Extremities:  Without edema. ?Neurologic:  Alert and  oriented x4 ?Psych: Normal mood and affect.  ? ? ?Assessment: ?71 year old female with history of HTN, HLD, GERD with reflux esophagitis, multiple  adenomatous colon polyps s/p partial colectomy in 2021 with pathology revealing serrated polyp with focal low-grade cytologic dysplasia as well as other sessile serial  polyps and tubular adenomas without dysplasi

## 2021-04-28 NOTE — H&P (View-Only) (Signed)
? ? ?Referring Provider: Chesley Noon, MD ?Primary Care Physician:  Chesley Noon, MD ?Primary GI Physician: Dr. Abbey Chatters ? ?Chief Complaint  ?Patient presents with  ? Colonoscopy  ? ? ?HPI:   ?Catherine Carney is a 71 y.o. female presenting today with a history of multiple adenomatous colon polyps, reflux esophagitis, dysphagia, presenting today to discuss scheduling surveillance colonoscopy. ? ?Colonoscopy October 2021 with nonbleeding internal hemorrhoids, 13 mm polyp in the cecum resected and retrieved and clip placed, 11 mm polyp at the ileocecal valve resected and retrieved and 2 clips placed, multiple large ascending unresectable colon polyps left behind.  Recommended referral to surgery to discuss right hemicolectomy and follow-up colonoscopy in 1 year.  Pathology revealed tubular adenomas. ? ?She underwent partial colectomy 12/25/2019 with removal of section just proximal to terminal ileum to proximal transverse colon s/p side-to-side ileocolonic anastomosis.  Pathology revealed sessile serrated polyp with focal low-grade cytologic dysplasia, other sessile serial polyps without high-grade dysplasia, tubular adenomas without high-grade dysplasia or carcinoma, unremarkable appendix, 18 lymph nodes negative. ? ?Today:  ?Doing well overall.  Reports she had some loose stools after she had the partial colectomy, but this has resolved.  Denies constipation, diarrhea, BRBPR, melena, abdominal pain, unintentional weight loss. ? ?GERD is well controlled on omeprazole 40 mg daily. No dysphagia, nausea, or vomiting.  ? ? ?Past Medical History:  ?Diagnosis Date  ? Cataract   ? Combined form OU  ? Family history of adverse reaction to anesthesia   ? sister had PONV  ? Female bladder prolapse   ? Hyperlipidemia   ? Hypertension   ? Hypertensive retinopathy   ? OU  ? Macular hole of right eye   ? Osteoarthritis   ? ? ?Past Surgical History:  ?Procedure Laterality Date  ? 25 GAUGE PARS PLANA VITRECTOMY WITH 20 GAUGE  MVR PORT FOR MACULAR HOLE Right 04/20/2019  ? Procedure: 28 GAUGE PARS PLANA VITRECTOMY WITH 20 GAUGE MVR PORT FOR MACULAR HOLE WITH MEMBRANE PEEL;  Surgeon: Bernarda Caffey, MD;  Location: [facility];  Service: Ophthalmology;  Laterality: Right;  ? BIOPSY  11/17/2019  ? Procedure: BIOPSY;  Surgeon: [name], DO;  [facility];  Service: Endoscopy;;  ? CATARACT EXTRACTION Right   ? CHOLECYSTECTOMY    ? COLONOSCOPY WITH PROPOFOL N/A 11/17/2019  ? Surgeon: [name], DO; nonbleeding internal hemorrhoids, 13 mm tubular adenoma in the cecum, 11 mm tubular adenoma at the ileocecal valve, multiple large ascending unresectable colon polyps left behind. Referred to general suregery for right hemicolectomy. Repeat TCS in 1 year.  ? ESOPHAGOGASTRODUODENOSCOPY (EGD) WITH PROPOFOL N/A 11/17/2019  ? Surgeon: [name], DO;  small hiatal hernia, grade a reflux esophagitis, gastritis biopsied, normal examined duodenum.  Pathology with mild nonspecific reactive gastropathy, negative for H. pylori.  ? EYE SURGERY Right 04/20/2019  ? Speculator repair - PPV/MP - Dr. Bernarda Caffey  ? GALLBLADDER SURGERY    ? GAS/FLUID EXCHANGE Right 04/20/2019  ? Procedure: Gas/Fluid Exchange;  Surgeon: Bernarda Caffey, MD;  Location: [facility];  Service: Ophthalmology;  Laterality: Right;  ? HEMOSTASIS CLIP PLACEMENT  11/17/2019  ? Procedure: HEMOSTASIS CLIP PLACEMENT;  Surgeon: [name], DO;  [facility];  Service: Endoscopy;;  ? PARTIAL COLECTOMY N/A 12/25/2019  ? Procedure: PARTIAL COLECTOMY;  Surgeon: Aviva Signs, MD;  [facility];  Service: General;  Laterality: N/A;  ? PHOTOCOAGULATION WITH LASER Right 04/20/2019  ? Procedure: Photocoagulation With Laser;  Surgeon:  Bernarda Caffey, MD;  Location: [facility];  Service: Ophthalmology;  Laterality: Right;  ? POLYPECTOMY  11/17/2019  ? Procedure: POLYPECTOMY;  Surgeon: [name], DO;  [facility];  Service: Endoscopy;;  ? TUBAL LIGATION     ? ? ?Current Outpatient Medications  ?Medication Sig Dispense Refill  ? acetaminophen (TYLENOL) 500 MG tablet Take 500 mg by mouth every 6 (six) hours as needed (for pain.).     ? ARTIFICIAL TEAR SOLUTION OP Place 1 drop into both eyes daily as needed (dry eyes).    ? aspirin EC 81 MG tablet Take 81 mg by mouth daily.    ? Calcium Carb-Cholecalciferol (CALCIUM 600+D) 600-800 MG-UNIT TABS Take 1 tablet by mouth daily.    ? Coenzyme Q10 (COQ10) 100 MG CAPS Take 100 mg by mouth daily.    ? metoprolol succinate (TOPROL-XL) 25 MG 24 hr tablet Take 25 mg by mouth daily.     ? Multiple Vitamin (MULTIVITAMIN WITH MINERALS) TABS tablet Take 1 tablet by mouth daily. Women's Multivitamin 50+    ? nystatin cream (MYCOSTATIN) Apply 1 application topically 2 (two) times daily.    ? Omega 3-6-9 Fatty Acids (TRIPLE OMEGA-3-6-9 PO) Take 1 capsule by mouth daily.    ? omeprazole (PRILOSEC) 40 MG capsule TAKE 1 CAPSULE BY MOUTH ONCE DAILY BEFORE BREAKFAST 60 capsule 0  ? rosuvastatin (CRESTOR) 20 MG tablet Take 20 mg by mouth at bedtime.    ? valsartan (DIOVAN) 320 MG tablet Take 320 mg by mouth daily.    ? vitamin B-12 (CYANOCOBALAMIN) 500 MCG tablet Take 500 mcg by mouth daily.    ? ?No current facility-administered medications for this visit.  ? ? ?Allergies as of 04/30/2021  ? (No Known Allergies)  ? ? ?Family History  ?Problem Relation Age of Onset  ? Other Mother   ?     irregular heart beat  ? Hypertension Mother   ? Cancer Father   ?     lung  ? Hypertension Sister   ? High Cholesterol Sister   ? Colon cancer Sister 5  ? Rheum arthritis Sister   ? Other Sister   ?     irregular heart beat  ? Diabetes Brother   ? Hypertension Brother   ? High Cholesterol Brother   ? Atrial fibrillation Brother   ? Melanoma Brother   ?     right ear  ? Heart attack Maternal Grandmother   ? Cancer Maternal Grandfather   ? Hypertension Son   ? Cancer Other   ?     maternal side  ? Stroke Other   ?     maternal side  ? Colon cancer Maternal Uncle    ? Colon cancer Maternal Aunt   ? ? ?Social History  ? ?Socioeconomic History  ? Marital status: Married  ?  Spouse name: Not on file  ? Number of children: Not on file  ? Years of education: Not on file  ? Highest education level: Not on file  ?Occupational History  ? Not on file  ?Tobacco Use  ? Smoking status: Never  ? Smokeless tobacco: Never  ?Vaping Use  ? Vaping Use: Never used  ?Substance and Sexual Activity  ? Alcohol use: No  ?  Alcohol/week: 0.0 standard drinks  ? Drug use: No  ? Sexual activity: Not Currently  ?  Birth control/protection: Surgical, Post-menopausal  ?  Comment: tubal  ?Other Topics Concern  ? Not on file  ?  Social History Narrative  ? Not on file  ? ?Social Determinants of Health  ? ?Financial Resource Strain: Not on file  ?Food Insecurity: Not on file  ?Transportation Needs: Not on file  ?Physical Activity: Not on file  ?Stress: Not on file  ?Social Connections: Not on file  ? ? ?Review of Systems: ?Gen: Denies fever, chills, cold or flulike symptoms, presyncope, syncope. ?CV: Denies chest pain, palpitations. ?Resp: Denies dyspnea or cough. ?GI: See HPI ?Heme: See HPI ? ?Physical Exam: ?BP (!) 154/75   Pulse 66   Temp (!) 97.5 ?F (36.4 ?C) (Temporal)   Ht '5\' 6"'$  (1.676 m)   Wt 192 lb 3.2 oz (87.2 kg)   BMI 31.02 kg/m?  ?General:   Alert and oriented. No distress noted. Pleasant and cooperative.  ?Head:  Normocephalic and atraumatic. ?Eyes:  Conjuctiva clear without scleral icterus. ?Heart:  S1, S2 present without murmurs appreciated. ?Lungs:  Clear to auscultation bilaterally. No wheezes, rales, or rhonchi. No distress.  ?Abdomen:  +BS, soft, non-tender and non-distended. No rebound or guarding. No HSM or masses noted. ?Msk:  Symmetrical without gross deformities. Normal posture. ?Extremities:  Without edema. ?Neurologic:  Alert and  oriented x4 ?Psych: Normal mood and affect.  ? ? ?Assessment: ?71 year old female with history of HTN, HLD, GERD with reflux esophagitis, multiple  adenomatous colon polyps s/p partial colectomy in 2021 with pathology revealing serrated polyp with focal low-grade cytologic dysplasia as well as other sessile serial  polyps and tubular adenomas without dysplasi

## 2021-04-30 ENCOUNTER — Other Ambulatory Visit: Payer: Self-pay

## 2021-04-30 ENCOUNTER — Encounter: Payer: Self-pay | Admitting: Gastroenterology

## 2021-04-30 ENCOUNTER — Ambulatory Visit: Payer: Medicare HMO | Admitting: Gastroenterology

## 2021-04-30 ENCOUNTER — Telehealth: Payer: Self-pay

## 2021-04-30 VITALS — BP 154/75 | HR 66 | Temp 97.5°F | Ht 66.0 in | Wt 192.2 lb

## 2021-04-30 DIAGNOSIS — K219 Gastro-esophageal reflux disease without esophagitis: Secondary | ICD-10-CM | POA: Insufficient documentation

## 2021-04-30 DIAGNOSIS — Z800 Family history of malignant neoplasm of digestive organs: Secondary | ICD-10-CM

## 2021-04-30 DIAGNOSIS — Z86010 Personal history of colonic polyps: Secondary | ICD-10-CM | POA: Diagnosis not present

## 2021-04-30 DIAGNOSIS — Z860101 Personal history of adenomatous and serrated colon polyps: Secondary | ICD-10-CM | POA: Insufficient documentation

## 2021-04-30 MED ORDER — PEG 3350-KCL-NA BICARB-NACL 420 G PO SOLR: 4000.0000 mL | ORAL | 0 refills | Status: DC

## 2021-04-30 NOTE — Telephone Encounter (Signed)
Called pt, TCS w/Propofol ASA 2 w/Dr. Abbey Chatters scheduled for 06/06/21. Pt called back few minutes later after realizing she had another appt same day. TCS changed to 05/27/21 at 1:45pm. Rx for prep sent to pharmacy. Orders entered. Instructions mailed. ?

## 2021-04-30 NOTE — Patient Instructions (Signed)
We will arrange to have a colonoscopy in the near future with Dr. Abbey Chatters. ? ?Continue taking omeprazole 40 mg daily 30 minutes before breakfast for reflux.  ? ?We will plan to follow-up with you in 1 year or sooner if needed.  Do not hesitate to call if you have any questions or concerns prior to your next visit. ? ?It was great meeting you today!  ? ?Aliene Altes, PA-C ?[facility] ? ?

## 2021-05-13 ENCOUNTER — Ambulatory Visit: Payer: Medicare HMO | Admitting: Adult Health

## 2021-05-13 ENCOUNTER — Encounter: Payer: Self-pay | Admitting: Adult Health

## 2021-05-13 VITALS — BP 153/79 | HR 61 | Ht 66.0 in | Wt 193.0 lb

## 2021-05-13 DIAGNOSIS — Z4689 Encounter for fitting and adjustment of other specified devices: Secondary | ICD-10-CM | POA: Diagnosis not present

## 2021-05-13 DIAGNOSIS — N362 Urethral caruncle: Secondary | ICD-10-CM

## 2021-05-13 DIAGNOSIS — N813 Complete uterovaginal prolapse: Secondary | ICD-10-CM

## 2021-05-13 NOTE — Progress Notes (Signed)
?  Subjective:  ?  ? Patient ID: Catherine Carney, female   DOB: [birth year], 71 y.o.   MRN: [id number] ? ?HPI ?Catherine Carney is a 71 year old white female, married, PM in for pessary maintenance. She says it comes down at times and she pushes it back, has never fell out. ?PCP is Dr Melford Aase. ? ?Review of Systems ?Denies any vaginals bleeding, discharge or odor. ?Reviewed past medical,surgical, social and family history. Reviewed medications and allergies.  ?   ?Objective:  ? Physical Exam ?BP (!) 153/79 (BP Location: Left Arm, Patient Position: Sitting, Cuff Size: Normal)   Pulse 61   Ht '5\' 6"'$  (1.676 m)   Wt 193 lb (87.5 kg)   BMI 31.15 kg/m?   ?  Skin warm and dry.Pelvic: external genitalia is normal in appearance no lesions, vagina: pessary,#5 milex ring with support, easily removed, washed with soap and water and dried,no lesion in vagina or irritation, pessary easily inserted,urethra has caruncle, cervix:smooth,uterus: normal size, shape and contour, non tender, no masses felt,+prolapse,  adnexa: no masses or tenderness noted. Bladder is non tender and no masses felt. ?  ?Examination chaperoned by Catherine Pupa LPN ? fall risk is low ? [facility] - 05/13/21 0858   ? ?  ? Pregnancy Intention Screening  ? Does the patient want to become pregnant in the next year? N/A   ? Does the patient's partner want to become pregnant in the next year? N/A   ? Would the patient like to discuss contraceptive options today? N/A   ?  ? Contraception Wrap Up  ? Current Method Female Sterilization   ? End Method Female Sterilization   ? Contraception Counseling Provided No   ? ?  ?  ? ?  ?  ?Assessment:  ?   ?   1. Encounter for pessary maintenance ?Milex ring #5 with support ? ?2. Uterine procidentia ? ?3. Urethral caruncle ?   ?Plan:  ?   ?Will try 4 months for pessary maintenance but call before if needed  ?   ?

## 2021-05-27 ENCOUNTER — Encounter (HOSPITAL_COMMUNITY): Payer: Self-pay

## 2021-05-27 ENCOUNTER — Ambulatory Visit (HOSPITAL_COMMUNITY)
Admission: RE | Admit: 2021-05-27 | Discharge: 2021-05-27 | Disposition: A | Discharge status: Home / Self Care | Payer: Medicare HMO | Attending: Internal Medicine | Admitting: Internal Medicine

## 2021-05-27 ENCOUNTER — Other Ambulatory Visit: Payer: Self-pay

## 2021-05-27 ENCOUNTER — Ambulatory Visit (HOSPITAL_BASED_OUTPATIENT_CLINIC_OR_DEPARTMENT_OTHER): Payer: Medicare HMO | Admitting: Certified Registered Nurse Anesthetist

## 2021-05-27 ENCOUNTER — Ambulatory Visit (HOSPITAL_COMMUNITY): Payer: Medicare HMO | Admitting: Certified Registered Nurse Anesthetist

## 2021-05-27 ENCOUNTER — Encounter (HOSPITAL_COMMUNITY)
Admission: RE | Disposition: A | Discharge status: Home / Self Care | Payer: Self-pay | Source: Home / Self Care | Attending: Internal Medicine

## 2021-05-27 DIAGNOSIS — I10 Essential (primary) hypertension: Secondary | ICD-10-CM | POA: Diagnosis not present

## 2021-05-27 DIAGNOSIS — Z9049 Acquired absence of other specified parts of digestive tract: Secondary | ICD-10-CM | POA: Diagnosis not present

## 2021-05-27 DIAGNOSIS — K219 Gastro-esophageal reflux disease without esophagitis: Secondary | ICD-10-CM | POA: Insufficient documentation

## 2021-05-27 DIAGNOSIS — Z86010 Personal history of colonic polyps: Secondary | ICD-10-CM | POA: Insufficient documentation

## 2021-05-27 DIAGNOSIS — Z800 Family history of malignant neoplasm of digestive organs: Secondary | ICD-10-CM

## 2021-05-27 DIAGNOSIS — K648 Other hemorrhoids: Secondary | ICD-10-CM | POA: Insufficient documentation

## 2021-05-27 DIAGNOSIS — D124 Benign neoplasm of descending colon: Secondary | ICD-10-CM | POA: Insufficient documentation

## 2021-05-27 DIAGNOSIS — K635 Polyp of colon: Secondary | ICD-10-CM | POA: Diagnosis not present

## 2021-05-27 DIAGNOSIS — Z980 Intestinal bypass and anastomosis status: Secondary | ICD-10-CM | POA: Insufficient documentation

## 2021-05-27 DIAGNOSIS — Z79899 Other long term (current) drug therapy: Secondary | ICD-10-CM | POA: Diagnosis not present

## 2021-05-27 HISTORY — PX: COLONOSCOPY WITH PROPOFOL: SHX5780

## 2021-05-27 HISTORY — PX: POLYPECTOMY: SHX5525

## 2021-05-27 SURGERY — COLONOSCOPY WITH PROPOFOL
Anesthesia: General

## 2021-05-27 MED ORDER — LIDOCAINE HCL (CARDIAC) PF 100 MG/5ML IV SOSY
PREFILLED_SYRINGE | INTRAVENOUS | Status: DC | PRN
  Administered 2021-05-27: 50 mg via INTRAVENOUS

## 2021-05-27 MED ORDER — LACTATED RINGERS IV SOLN: INTRAVENOUS | Status: DC

## 2021-05-27 MED ORDER — PROPOFOL 10 MG/ML IV BOLUS
INTRAVENOUS | Status: DC | PRN
  Administered 2021-05-27: 100 mg via INTRAVENOUS
  Administered 2021-05-27 (×2): 50 mg via INTRAVENOUS

## 2021-05-27 NOTE — Anesthesia Postprocedure Evaluation (Signed)
Anesthesia Post Note ? ?Patient: Catherine Carney ? ?Procedure(s) Performed: COLONOSCOPY WITH PROPOFOL ?POLYPECTOMY ? ?Patient location during evaluation: Phase II ?Anesthesia Type: General ?Level of consciousness: awake ?Pain management: pain level controlled ?Vital Signs Assessment: post-procedure vital signs reviewed and stable ?Respiratory status: spontaneous breathing and respiratory function stable ?Cardiovascular status: blood pressure returned to baseline and stable ?Postop Assessment: no headache and no apparent nausea or vomiting ?Anesthetic complications: no ?Comments: Late entry ? ? ?No notable events documented. ? ? ?Last Vitals:  ?Vitals:  ? 05/27/21 1215 05/27/21 1307  ?BP: (!) 174/89 (!) 106/55  ?Pulse:  73  ?Resp: 16 17  ?Temp: 36.9 ?C 36.6 ?C  ?SpO2: 94% 96%  ?  ?Last Pain:  ?Vitals:  ? 05/27/21 1307  ?TempSrc: Oral  ?PainSc: 0-No pain  ? ? ?  ?  ?  ?  ?  ?  ? ?Louann Sjogren ? ? ? ? ?

## 2021-05-27 NOTE — Transfer of Care (Signed)
Immediate Anesthesia Transfer of Care Note ? ?Patient: Catherine Carney ? ?Procedure(s) Performed: COLONOSCOPY WITH PROPOFOL ?POLYPECTOMY ? ?Patient Location: Endoscopy Unit ? ?Anesthesia Type:General ? ?Level of Consciousness: awake ? ?Airway & Oxygen Therapy: Patient Spontanous Breathing ? ?Post-op Assessment: Report given to RN and Post -op Vital signs reviewed and stable ? ?Post vital signs: Reviewed and stable ? ?Last Vitals:  ?Vitals Value Taken Time  ?BP    ?Temp    ?Pulse 75   ?Resp 18   ?SpO2    ? ? ?Last Pain:  ?Vitals:  ? 05/27/21 1250  ?TempSrc:   ?PainSc: 0-No pain  ?   ? ?Patients Stated Pain Goal: 8 (05/27/21 1215) ? ?Complications: No notable events documented. ?

## 2021-05-27 NOTE — Interval H&P Note (Signed)
History and Physical Interval Note: ? ?05/27/2021 ?12:34 PM ? ?Catherine Carney  has presented today for surgery, with the diagnosis of history of adenomatous colon polyps, family history of colon cancer.  The various methods of treatment have been discussed with the patient and family. After consideration of risks, benefits and other options for treatment, the patient has consented to  Procedure(s) with comments: ?COLONOSCOPY WITH PROPOFOL (N/A) - 1:45pm as a surgical intervention.  The patient's history has been reviewed, patient examined, no change in status, stable for surgery.  I have reviewed the patient's chart and labs.  Questions were answered to the patient's satisfaction.   ? ? ?[name] ? ? ?

## 2021-05-27 NOTE — Anesthesia Preprocedure Evaluation (Signed)
Anesthesia Evaluation  ?Patient identified by MRN, date of birth, ID band ?Patient awake ? ? ? ?Reviewed: ?Allergy & Precautions, H&P , NPO status , Patient's Chart, lab work & pertinent test results, reviewed documented beta blocker date and time  ? ?Airway ?Mallampati: II ? ?TM Distance: >3 FB ?Neck ROM: full ? ? ? Dental ?no notable dental hx. ? ?  ?Pulmonary ?neg pulmonary ROS,  ?  ?Pulmonary exam normal ?breath sounds clear to auscultation ? ? ? ? ? ? Cardiovascular ?Exercise Tolerance: Good ?hypertension, negative cardio ROS ? ? ?Rhythm:regular Rate:Normal ? ? ?  ?Neuro/Psych ? Neuromuscular disease negative psych ROS  ? GI/Hepatic ?negative GI ROS, Neg liver ROS, hiatal hernia, GERD  Medicated,  ?Endo/Other  ?negative endocrine ROS ? Renal/GU ?negative Renal ROS  ?negative genitourinary ?  ?Musculoskeletal ? ? Abdominal ?  ?Peds ? Hematology ?negative hematology ROS ?(+)   ?Anesthesia Other Findings ? ? Reproductive/Obstetrics ?negative OB ROS ? ?  ? ? ? ? ? ? ? ? ? ? ? ? ? ?  ?  ? ? ? ? ? ? ? ? ?Anesthesia Physical ?Anesthesia Plan ? ?ASA: 2 ? ?Anesthesia Plan: General  ? ?Post-op Pain Management:   ? ?Induction:  ? ?PONV Risk Score and Plan: Propofol infusion ? ?Airway Management Planned:  ? ?Additional Equipment:  ? ?Intra-op Plan:  ? ?Post-operative Plan:  ? ?Informed Consent: I have reviewed the patients History and Physical, chart, labs and discussed the procedure including the risks, benefits and alternatives for the proposed anesthesia with the patient or authorized representative who has indicated his/her understanding and acceptance.  ? ? ? ?Dental Advisory Given ? ?Plan Discussed with: CRNA ? ?Anesthesia Plan Comments:   ? ? ? ? ? ? ?Anesthesia Quick Evaluation ? ?

## 2021-05-27 NOTE — Discharge Instructions (Signed)
?  Colonoscopy Discharge Instructions  Read the instructions outlined below and refer to this sheet in the next few weeks. These discharge instructions provide you with general information on caring for yourself after you leave the hospital. Your doctor may also give you specific instructions. While your treatment has been planned according to the most current medical practices available, unavoidable complications occasionally occur.   ACTIVITY You may resume your regular activity, but move at a slower pace for the next 24 hours.  Take frequent rest periods for the next 24 hours.  Walking will help get rid of the air and reduce the bloated feeling in your belly (abdomen).  No driving for 24 hours (because of the medicine (anesthesia) used during the test).   Do not sign any important legal documents or operate any machinery for 24 hours (because of the anesthesia used during the test).  NUTRITION Drink plenty of fluids.  You may resume your normal diet as instructed by your doctor.  Begin with a light meal and progress to your normal diet. Heavy or fried foods are harder to digest and may make you feel sick to your stomach (nauseated).  Avoid alcoholic beverages for 24 hours or as instructed.  MEDICATIONS You may resume your normal medications unless your doctor tells you otherwise.  WHAT YOU CAN EXPECT TODAY Some feelings of bloating in the abdomen.  Passage of more gas than usual.  Spotting of blood in your stool or on the toilet paper.  IF YOU HAD POLYPS REMOVED DURING THE COLONOSCOPY: No aspirin products for 7 days or as instructed.  No alcohol for 7 days or as instructed.  Eat a soft diet for the next 24 hours.  FINDING OUT THE RESULTS OF YOUR TEST Not all test results are available during your visit. If your test results are not back during the visit, make an appointment with your caregiver to find out the results. Do not assume everything is normal if you have not heard from your  caregiver or the medical facility. It is important for you to follow up on all of your test results.  SEEK IMMEDIATE MEDICAL ATTENTION IF: You have more than a spotting of blood in your stool.  Your belly is swollen (abdominal distention).  You are nauseated or vomiting.  You have a temperature over 101.  You have abdominal pain or discomfort that is severe or gets worse throughout the day.   Your colonoscopy revealed 1 polyp(s) which I removed successfully. Await pathology results, my office will contact you. I recommend repeating colonoscopy in 5 years for surveillance purposes. Otherwise follow up with Gi as needed.    I hope you have a great rest of your week!  Love Chowning K. Amery Vandenbos, D.O. Gastroenterology and Hepatology [facility]  

## 2021-05-27 NOTE — Op Note (Signed)
[facility] ?Patient Name: Catherine Carney ?Procedure Date: 05/27/2021 12:32 PM ?MRN: [id number] ?Date of Birth: [birth year] ?Attending MD: Elon Alas. Abbey Chatters , DO ?CSN: [id number] ?Age: 71 ?Admit Type: Outpatient ?Procedure:                Colonoscopy ?Indications:              High risk colon cancer surveillance: Personal  ?                          history of adenoma (10 mm or greater in size), High  ?                          risk colon cancer surveillance: Personal history of  ?                          sessile serrated colon polyp (10 mm or greater in  ?                          size), High risk colon cancer surveillance:  ?                          Personal history of sessile serrated colon polyp  ?                          with dysplasia ?Providers:                Elon Alas. Abbey Chatters, DO, Caprice Kluver, Randa Spike,  ?                          Technician ?Referring MD:              ?Medicines:                See the Anesthesia note for documentation of the  ?                          administered medications ?Complications:            No immediate complications. ?Estimated Blood Loss:     Estimated blood loss was minimal. ?Procedure:                Pre-Anesthesia Assessment: ?                          - The anesthesia plan was to use monitored  ?                          anesthesia care (MAC). ?                          After obtaining informed consent, the colonoscope  ?                          was passed under direct vision. Throughout the  ?                          procedure, the patient's blood pressure, pulse,  and  ?                          oxygen saturations were monitored continuously. The  ?                          PCF-HQ190L ([id number]) scope was introduced through  ?                          the anus and advanced to the the ileocolonic  ?                          anastomosis. The colonoscopy was performed without  ?                          difficulty. The patient tolerated the procedure  ?                           well. The quality of the bowel preparation was  ?                          evaluated using the BBPS [facility] Bowel Preparation  ?                          Scale) with scores of: Right Colon = 3, Transverse  ?                          Colon = 3 and Left Colon = 3 (entire mucosa seen  ?                          well with no residual staining, small fragments of  ?                          stool or opaque liquid). The total BBPS score  ?                          equals 9. ?Scope In: 12:54:01 PM ?Scope Out: 1:04:05 PM ?Scope Withdrawal Time: 0 hours 7 minutes 24 seconds  ?Total Procedure Duration: 0 hours 10 minutes 4 seconds  ?Findings: ?     The perianal and digital rectal examinations were normal. ?     Non-bleeding internal hemorrhoids were found during endoscopy. ?     A 5 mm polyp was found in the descending colon. The polyp was flat. The  ?     polyp was removed with a cold snare. Resection and retrieval were  ?     complete. ?     There was evidence of a prior side-to-side ileo-colonic anastomosis in  ?     the transverse colon. This was patent and was characterized by healthy  ?     appearing mucosa. ?Impression:               - Non-bleeding internal hemorrhoids. ?                          - One 5 mm polyp in  the descending colon, removed  ?                          with a cold snare. Resected and retrieved. ?                          - Patent side-to-side ileo-colonic anastomosis,  ?                          characterized by healthy appearing mucosa. ?Moderate Sedation: ?     Per Anesthesia Care ?Recommendation:           - Patient has a contact number available for  ?                          emergencies. The signs and symptoms of potential  ?                          delayed complications were discussed with the  ?                          patient. Return to normal activities tomorrow.  ?                          Written discharge instructions were provided to the  ?                           patient. ?                          - Resume previous diet. ?                          - Continue present medications. ?                          - Await pathology results. ?                          - Repeat colonoscopy in 5 years for surveillance  ?                          and family history of colon cancer ?                          - Return to GI clinic PRN. ?Procedure Code(s):        --- Professional --- ?                          [phone], Colonoscopy, flexible; with removal of  ?                          tumor(s), polyp(s), or other lesion(s) by snare  ?                          technique ?Diagnosis Code(s):        --- Professional --- ?  K63.5, Polyp of colon ?                          Z98.0, Intestinal bypass and anastomosis status ?                          K64.8, Other hemorrhoids ?                          Z86.010, Personal history of colonic polyps ?CPT copyright 2019 American Medical Association. All rights reserved. ?The codes documented in this report are preliminary and upon coder review may  ?be revised to meet current compliance requirements. ?Elon Alas. Abbey Chatters, DO ?Elon Alas. Maddock, DO ?05/27/2021 1:09:31 PM ?This report has been signed electronically. ?Number of Addenda: 0 ?

## 2021-05-29 ENCOUNTER — Encounter (HOSPITAL_COMMUNITY): Payer: Self-pay | Admitting: Internal Medicine

## 2021-05-29 LAB — SURGICAL PATHOLOGY

## 2021-06-10 ENCOUNTER — Encounter (INDEPENDENT_AMBULATORY_CARE_PROVIDER_SITE_OTHER): Payer: Medicare HMO | Admitting: Ophthalmology

## 2021-06-16 NOTE — Progress Notes (Signed)
[facility] - Clinic Note  06/24/2021    CHIEF COMPLAINT Patient presents for Retina Follow Up  HISTORY OF PRESENT ILLNESS: Catherine Carney is a 71 y.o. female who presents to the clinic today for:   HPI     Retina Follow Up   Patient presents with  Other.  In right eye.  This started 1 year ago.  I, the attending physician,  performed the HPI with the patient and updated documentation appropriately.        Comments   Patient here for 1 year retina follow up for [facility] OD/VMT OS. Patient states vision about the same. Having a time with glasses. Had gotten a new RX 6 months ago but didn't fill it. No eye pain.       Last edited by Rennis Chris, MD on 06/24/2021 12:36 PM.     Referring physician: Eartha Inch, MD [address] [place],  [place] [address]  HISTORICAL INFORMATION:   Selected notes from the MEDICAL RECORD NUMBER Referred by Dr Mayford Knife for retina eval   CURRENT MEDICATIONS: Current Outpatient Medications (Ophthalmic Drugs)  Medication Sig   ARTIFICIAL TEAR SOLUTION OP Place 1 drop into both eyes daily as needed (dry eyes).   No current facility-administered medications for this visit. (Ophthalmic Drugs)   Current Outpatient Medications (Other)  Medication Sig   acetaminophen (TYLENOL) 500 MG tablet Take 500 mg by mouth every 6 (six) hours as needed (for pain.).    aspirin EC 81 MG tablet Take 81 mg by mouth daily.   Calcium Carb-Cholecalciferol (CALCIUM 600+D) 600-800 MG-UNIT TABS Take 1 tablet by mouth daily.   Coenzyme Q10 (COQ10) 100 MG CAPS Take 100 mg by mouth daily.   metoprolol succinate (TOPROL-XL) 50 MG 24 hr tablet Take 50 mg by mouth daily.   Multiple Vitamin (MULTIVITAMIN WITH MINERALS) TABS tablet Take 1 tablet by mouth daily. Women's Multivitamin 50+   nystatin cream (MYCOSTATIN) Apply 1 application. topically 2 (two) times daily as needed for dry skin.   Omega 3-6-9 Fatty Acids (TRIPLE OMEGA-3-6-9 PO) Take 1 capsule by  mouth daily.   omeprazole (PRILOSEC) 40 MG capsule TAKE 1 CAPSULE BY MOUTH ONCE DAILY BEFORE BREAKFAST   rosuvastatin (CRESTOR) 20 MG tablet Take 20 mg by mouth at bedtime.   valsartan (DIOVAN) 320 MG tablet Take 320 mg by mouth daily.   vitamin B-12 (CYANOCOBALAMIN) 500 MCG tablet Take 500 mcg by mouth daily.   No current facility-administered medications for this visit. (Other)   REVIEW OF SYSTEMS: ROS   Positive for: Gastrointestinal, Musculoskeletal, Eyes Negative for: Constitutional, Neurological, Skin, Genitourinary, HENT, Endocrine, Cardiovascular, Respiratory, Psychiatric, Allergic/Imm, Heme/Lymph Last edited by Laddie Aquas, COA on 06/24/2021  9:22 AM.     ALLERGIES No Known Allergies  PAST MEDICAL HISTORY Past Medical History:  Diagnosis Date   Cataract    Combined form OU   Family history of adverse reaction to anesthesia    sister had PONV   Female bladder prolapse    Hyperlipidemia    Hypertension    Hypertensive retinopathy    OU   Macular hole of right eye    Osteoarthritis    Past Surgical History:  Procedure Laterality Date   25 GAUGE PARS PLANA VITRECTOMY WITH 20 GAUGE MVR PORT FOR MACULAR HOLE Right 04/20/2019   Procedure: 25 GAUGE PARS PLANA VITRECTOMY WITH 20 GAUGE MVR PORT FOR MACULAR HOLE WITH MEMBRANE PEEL;  Surgeon: Rennis Chris, MD;  Location: [facility]  OR;  Service: Ophthalmology;  Laterality: Right;   BIOPSY  11/17/2019   Procedure: BIOPSY;  Surgeon: Lanelle Bal, DO;  [facility];  Service: Endoscopy;;   CATARACT EXTRACTION Right    CHOLECYSTECTOMY     COLONOSCOPY WITH PROPOFOL N/A 11/17/2019   Surgeon: Lanelle Bal, DO; nonbleeding internal hemorrhoids, 13 mm tubular adenoma in the cecum, 11 mm tubular adenoma at the ileocecal valve, multiple large ascending unresectable colon polyps left behind. Referred to general suregery for right hemicolectomy. Repeat TCS in 1 year.   COLONOSCOPY WITH PROPOFOL N/A 05/27/2021    Procedure: COLONOSCOPY WITH PROPOFOL;  Surgeon: Lanelle Bal, DO;  Location[facility];  Service: Endoscopy;  Laterality: N/A;  1:45pm   ESOPHAGOGASTRODUODENOSCOPY (EGD) WITH PROPOFOL N/A 11/17/2019   Surgeon: Lanelle Bal, DO;  small hiatal hernia, grade a reflux esophagitis, gastritis biopsied, normal examined duodenum.  Pathology with mild nonspecific reactive gastropathy, negative for H. pylori.   EYE SURGERY Right 04/20/2019   [facility] repair - PPV/MP - Dr. Rennis Chris   GALLBLADDER SURGERY     GAS/FLUID EXCHANGE Right 04/20/2019   Procedure: Gas/Fluid Exchange;  Surgeon: Rennis Chris, MD;  Location: [facility] OR;  Service: Ophthalmology;  Laterality: Right;   HEMOSTASIS CLIP PLACEMENT  11/17/2019   Procedure: HEMOSTASIS CLIP PLACEMENT;  Surgeon: Lanelle Bal, DO;  [facility];  Service: Endoscopy;;   PARTIAL COLECTOMY N/A 12/25/2019   Procedure: PARTIAL COLECTOMY;  Surgeon: Franky Macho, MD;  Location: [facility];  Service: General;  Laterality: N/A;   PHOTOCOAGULATION WITH LASER Right 04/20/2019   Procedure: Photocoagulation With Laser;  Surgeon: Rennis Chris, MD;  Location: [facility] OR;  Service: Ophthalmology;  Laterality: Right;   POLYPECTOMY  11/17/2019   Procedure: POLYPECTOMY;  Surgeon: Lanelle Bal, DO;  [facility];  Service: Endoscopy;;   POLYPECTOMY  05/27/2021   Procedure: POLYPECTOMY;  Surgeon: Lanelle Bal, DO;  [facility];  Service: Endoscopy;;   TUBAL LIGATION     FAMILY HISTORY Family History  Problem Relation Age of Onset   Other Mother        irregular heart beat   Hypertension Mother    Cancer Father        lung   Hypertension Sister    High Cholesterol Sister    Colon cancer Sister 22   Rheum arthritis Sister    Other Sister        irregular heart beat   Diabetes Brother    Hypertension Brother    High Cholesterol Brother    Atrial fibrillation Brother    Melanoma Brother        right ear   Heart attack  Maternal Grandmother    Cancer Maternal Grandfather    Hypertension Son    Cancer Other        maternal side   Stroke Other        maternal side   Colon cancer Maternal Uncle    Colon cancer Maternal Aunt    SOCIAL HISTORY Social History   Tobacco Use   Smoking status: Never   Smokeless tobacco: Never  Vaping Use   Vaping Use: Never used  Substance Use Topics   Alcohol use: No    Alcohol/week: 0.0 standard drinks   Drug use: No       OPHTHALMIC EXAM:  Base Eye Exam     Visual Acuity (Snellen - Linear)       Right Left  Dist cc 20/20 -1 20/20 -1    Correction: Glasses         Tonometry (Tonopen, 9:16 AM)       Right Left   Pressure 14 16         Pupils       Dark Light Shape React APD   Right 3 2 Round Brisk None   Left 3 2 Round Brisk None         Visual Fields (Counting fingers)       Left Right    Full Full         Extraocular Movement       Right Left    Full, Ortho Full, Ortho         Neuro/Psych     Oriented x3: Yes   Mood/Affect: Normal         Dilation     Both eyes: 1.0% Mydriacyl, 2.5% Phenylephrine @ 9:16 AM           Slit Lamp and Fundus Exam     External Exam       Right Left   External Normal          Slit Lamp Exam       Right Left   Lids/Lashes Dermatochalasis - upper lid, Meibomian gland dysfunction Dermatochalasis - upper lid, Meibomian gland dysfunction   Conjunctiva/Sclera White and quiet White and quiet   Cornea 1-2+ inferior Punctate epithelial erosions, well healed temporal cataract wounds, mild tear film debris 1+ Punctate epithelial erosions, trace Debris in tear film   Anterior Chamber Deep and quiet Deep and quiet   Iris Round and dilated Round and dilated   Lens PC IOL in good position with open PC 2+ Nuclear sclerosis, 2+ Cortical cataract   Anterior Vitreous post vitrectomy, clear Vitreous syneresis         Fundus Exam       Right Left   Disc mild Pallor, Sharp rim, mild  PPA/PPP Pink and Sharp, mild PPA/PPP   C/D Ratio 0.6 0.4   Macula flat; mac hole closed, Blunted foveal reflex, mild RPE mottling, No heme or edema Flat, good foveal reflex, mild RPE mottling, central cystic changes - resolved, No heme or edema   Vessels attenuated, mild tortuousity attenuated, mild tortuousity, mild AV crossing changes   Periphery attached, good 360 laser changes Attached, no heme           Refraction     Wearing Rx       Sphere Cylinder Axis Add   Right +0.25 +1.00 003 +2.25   Left +0.50 +1.00 009 +2.25           IMAGING AND PROCEDURES  Imaging and Procedures for @TODAY @  OCT, Retina - OU - Both Eyes       Right Eye Quality was good. Central Foveal Thickness: 315. Progression has been stable. Findings include no SRF, abnormal foveal contour, no IRF (Mac hole closed, irregular inner retinal surface).   Left Eye Quality was good. Central Foveal Thickness: 270. Progression has improved. Findings include no SRF, abnormal foveal contour, no IRF (Stable release of central VMT and interval improvement in central cystic changes -- now partial PVD).   Notes *Images captured and stored on drive  Diagnosis / Impression:  OD: Mac hole closed; abnormal foveal profile; no IRF/SRF OS: stable release of central VMT and interval improvement in central cystic changes -- now partial PVD  Clinical management:  See below  Abbreviations:  NFP - Normal foveal profile. CME - cystoid macular edema. PED - pigment epithelial detachment. IRF - intraretinal fluid. SRF - subretinal fluid. EZ - ellipsoid zone. ERM - epiretinal membrane. ORA - outer retinal atrophy. ORT - outer retinal tubulation. SRHM - subretinal hyper-reflective material            ASSESSMENT/PLAN:    ICD-10-CM   1. Macular hole of right eye  H35.341 OCT, Retina - OU - Both Eyes    2. Essential hypertension  I10     3. Hypertensive retinopathy of both eyes  H35.033 OCT, Retina - OU - Both Eyes     4. Combined forms of age-related cataract of left eye  H25.812     5. Pseudophakia  Z96.1     6. 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 Combined forms of age-related cataract of both eyes  H25.813      1,2. Full thickness macular hole OD  - s/p PPV/TissueBlue stain/MP/14% C3F8 OD, 03.11.21             - doing well             - mac hole closed  - BCVA now 20/20 OD post cataract surgery  - monitor  3,4. Hypertensive retinopathy OU  - discussed importance of tight BP control  - monitor  5. 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  - discussed diagnosis and progression  - under the expert management of Dr Salena Saner. Dione Booze  6. Pseudophakia OD  - s/p CE/IOL OD (Dr. Zetta Bills, 07.08.21)  - IOL in excellent position, doing well  - monitor  7. VMT OS -- resolved  - OCT 05.16.23 shows stable release of central VMT and interval improvement in central cystic changes -- now partial PVD  - f/u 1 yr, sooner prn -- DFE, OCT  Ophthalmic Meds Ordered this visit:  No orders of the defined types were placed in this encounter.    Return in about 1 year (around 06/25/2022) for DFE, OCT.  There are no Patient Instructions on file for this visit.  This document serves as a record of services personally performed by Karie Chimera, MD, PhD. It was created on their behalf by Joni Reining, an ophthalmic technician. The creation of this record is the provider's dictation and/or activities during the visit.    Electronically signed by: Joni Reining COA, 06/24/21  12:38 PM  Karie Chimera, M.D., Ph.D. Diseases & Surgery of the Retina and Vitreous [facility]  I have reviewed the above documentation for accuracy and completeness, and I agree with the above. Karie Chimera, M.D., Ph.D. 06/24/21 12:40 PM  Abbreviations: M myopia (nearsighted); A astigmatism; H hyperopia  (farsighted); P presbyopia; Mrx spectacle prescription;  CTL contact lenses; OD right eye; OS left eye; OU both eyes  XT exotropia; ET esotropia; PEK punctate epithelial keratitis; PEE punctate epithelial erosions; DES dry eye syndrome; MGD meibomian gland dysfunction; ATs artificial tears; PFAT's preservative free artificial tears; NSC nuclear sclerotic cataract; PSC posterior subcapsular cataract; ERM epi-retinal membrane; PVD posterior vitreous detachment; RD retinal detachment; DM diabetes mellitus; DR diabetic retinopathy; NPDR non-proliferative diabetic retinopathy; PDR proliferative diabetic retinopathy; CSME clinically significant macular edema; DME diabetic macular edema; dbh dot blot hemorrhages; CWS cotton wool spot; POAG primary open angle glaucoma; C/D cup-to-disc ratio; HVF humphrey visual field; GVF goldmann visual field; OCT  optical coherence tomography; IOP intraocular pressure; BRVO Branch retinal vein occlusion; CRVO central retinal vein occlusion; CRAO central retinal artery occlusion; BRAO branch retinal artery occlusion; RT retinal tear; SB scleral buckle; PPV pars plana vitrectomy; VH Vitreous hemorrhage; PRP panretinal laser photocoagulation; IVK intravitreal kenalog; VMT vitreomacular traction; MH Macular hole;  NVD neovascularization of the disc; NVE neovascularization elsewhere; AREDS age related eye disease study; ARMD age related macular degeneration; POAG primary open angle glaucoma; EBMD epithelial/anterior basement membrane dystrophy; ACIOL anterior chamber intraocular lens; IOL intraocular lens; PCIOL posterior chamber intraocular lens; Phaco/IOL phacoemulsification with intraocular lens placement; PRK photorefractive keratectomy; LASIK laser assisted in situ keratomileusis; HTN hypertension; DM diabetes mellitus; COPD chronic obstructive pulmonary disease

## 2021-06-24 ENCOUNTER — Ambulatory Visit (INDEPENDENT_AMBULATORY_CARE_PROVIDER_SITE_OTHER): Payer: Medicare HMO | Admitting: Ophthalmology

## 2021-06-24 ENCOUNTER — Encounter (INDEPENDENT_AMBULATORY_CARE_PROVIDER_SITE_OTHER): Payer: Self-pay | Admitting: Ophthalmology

## 2021-06-24 DIAGNOSIS — I10 Essential (primary) hypertension: Secondary | ICD-10-CM

## 2021-06-24 DIAGNOSIS — H35033 Hypertensive retinopathy, bilateral: Secondary | ICD-10-CM | POA: Diagnosis not present

## 2021-06-24 DIAGNOSIS — H43822 Vitreomacular adhesion, left eye: Secondary | ICD-10-CM | POA: Diagnosis not present

## 2021-06-24 DIAGNOSIS — Z961 Presence of intraocular lens: Secondary | ICD-10-CM

## 2021-06-24 DIAGNOSIS — H25812 Combined forms of age-related cataract, left eye: Secondary | ICD-10-CM

## 2021-06-24 DIAGNOSIS — H25813 Combined forms of age-related cataract, bilateral: Secondary | ICD-10-CM

## 2021-06-24 DIAGNOSIS — H35341 Macular cyst, hole, or pseudohole, right eye: Secondary | ICD-10-CM

## 2021-07-25 ENCOUNTER — Encounter (HOSPITAL_COMMUNITY): Payer: Self-pay

## 2021-07-25 ENCOUNTER — Other Ambulatory Visit: Payer: Self-pay

## 2021-07-25 DIAGNOSIS — Z7982 Long term (current) use of aspirin: Secondary | ICD-10-CM | POA: Diagnosis not present

## 2021-07-25 DIAGNOSIS — R519 Headache, unspecified: Secondary | ICD-10-CM | POA: Diagnosis present

## 2021-07-25 DIAGNOSIS — Z79899 Other long term (current) drug therapy: Secondary | ICD-10-CM | POA: Diagnosis not present

## 2021-07-25 DIAGNOSIS — I10 Essential (primary) hypertension: Secondary | ICD-10-CM | POA: Diagnosis not present

## 2021-07-25 NOTE — ED Triage Notes (Signed)
Pt states she does NOT have a headache but has had 5 episodes of sharp pain in the top of her head that last about 10 seconds.  No hx of of same.

## 2021-07-26 ENCOUNTER — Emergency Department (HOSPITAL_COMMUNITY): Payer: Medicare HMO

## 2021-07-26 ENCOUNTER — Emergency Department (HOSPITAL_COMMUNITY)
Admission: EM | Admit: 2021-07-26 | Discharge: 2021-07-26 | Disposition: A | Discharge status: Home / Self Care | Payer: Medicare HMO | Attending: Emergency Medicine | Admitting: Emergency Medicine

## 2021-07-26 DIAGNOSIS — R519 Headache, unspecified: Secondary | ICD-10-CM

## 2021-07-26 NOTE — Discharge Instructions (Signed)
You were seen today for shooting pains in your scalp.  Your CT scan is reassuring and shows no acute findings.  If you have recurrence of symptoms, monitor closely for other signs and symptoms including those of stroke.  You should follow-up with your primary doctor if symptoms persist.

## 2021-07-26 NOTE — ED Provider Notes (Signed)
[facility] EMERGENCY DEPARTMENT Provider Note   CSN: [id number] Arrival date & time: 07/25/21  2234     History  Chief Complaint  Patient presents with   Headache    IONNA AVIS is a 71 y.o. female.  HPI     This is a 71 year old female with a history of hypertension, hyperlipidemia who presents with intermittent head and scalp pain.  Patient states that she had several episodes throughout the evening of sharp pain on the top of her head and scalp.  Pain would last less than 10 seconds and self resolved.  She did not take anything for symptoms.  She did not have any associated vision changes, weakness, numbness, tingling, strokelike symptoms.  She has never had anything like this in the past.  Over the food was around 9 PM.  She states that she did note that her blood pressure was slightly higher than normal but also states that she was anxious about her symptoms.  She denies any recent systemic symptoms such as fevers or viral illness.  Currently she is asymptomatic  Home Medications Prior to Admission medications   Medication Sig Start Date End Date Taking? Authorizing Provider  acetaminophen (TYLENOL) 500 MG tablet Take 500 mg by mouth every 6 (six) hours as needed (for pain.).     [provider]  ARTIFICIAL TEAR SOLUTION OP Place 1 drop into both eyes daily as needed (dry eyes).    [provider]  aspirin EC 81 MG tablet Take 81 mg by mouth daily.    [provider]  Calcium Carb-Cholecalciferol (CALCIUM 600+D) 600-800 MG-UNIT TABS Take 1 tablet by mouth daily.    [provider]  Coenzyme Q10 (COQ10) 100 MG CAPS Take 100 mg by mouth daily.    [provider]  metoprolol succinate (TOPROL-XL) 50 MG 24 hr tablet Take 50 mg by mouth daily. 11/14/19   [provider]  Multiple Vitamin (MULTIVITAMIN WITH MINERALS) TABS tablet Take 1 tablet by mouth daily. Women's Multivitamin 50+    [provider]  nystatin cream  (MYCOSTATIN) Apply 1 application. topically 2 (two) times daily as needed for dry skin.    [provider]  Omega 3-6-9 Fatty Acids (TRIPLE OMEGA-3-6-9 PO) Take 1 capsule by mouth daily.    [provider]  omeprazole (PRILOSEC) 40 MG capsule TAKE 1 CAPSULE BY MOUTH ONCE DAILY BEFORE BREAKFAST 04/19/21   [name], DO  rosuvastatin (CRESTOR) 20 MG tablet Take 20 mg by mouth at bedtime. 10/02/19   [provider]  valsartan (DIOVAN) 320 MG tablet Take 320 mg by mouth daily.    [provider]  vitamin B-12 (CYANOCOBALAMIN) 500 MCG tablet Take 500 mcg by mouth daily.    [provider]      Allergies    Patient has no known allergies.    Review of Systems   Review of Systems  Gastrointestinal:  Negative for nausea.  Neurological:  Positive for headaches.  All other systems reviewed and are negative.   Physical Exam Updated Vital Signs BP (!) 179/105   Pulse 76   Temp 98.5 F (36.9 C) (Oral)   Resp 18   Ht 1.676 m ('5\' 6"'$ )   Wt 86.2 kg   SpO2 97%   BMI 30.67 kg/m  Physical Exam Vitals and nursing note reviewed.  Constitutional:      Appearance: She is well-developed. She is obese.     Comments: Elderly, nontoxic-appearing  HENT:  Head: Normocephalic and atraumatic.     Comments: Location of pinpointed pain evaluated just over the right side of the scalp, there are no scalp changes or irritation noted, no skin changes, no lesions or erythema Eyes:     Pupils: Pupils are equal, round, and reactive to light.  Cardiovascular:     Rate and Rhythm: Normal rate and regular rhythm.  Pulmonary:     Effort: Pulmonary effort is normal. No respiratory distress.  Abdominal:     General: Bowel sounds are normal.     Palpations: Abdomen is soft.  Musculoskeletal:     Cervical back: Neck supple.  Skin:    General: Skin is warm and dry.  Neurological:     Mental Status: She is alert and oriented to person, place, and time.      Comments: Cranial nerves II through XII intact, 5 out of 5 strength in all 4 extremities, no dysmetria to finger-nose-finger     ED Results / Procedures / Treatments   Labs (all labs ordered are listed, but only abnormal results are displayed) Labs Reviewed - No data to display  EKG None  Radiology CT Head Wo Contrast  Result Date: 07/26/2021 CLINICAL DATA:  Headache. EXAM: CT HEAD WITHOUT CONTRAST TECHNIQUE: Contiguous axial images were obtained from the base of the skull through the vertex without intravenous contrast. RADIATION DOSE REDUCTION: This exam was performed according to the departmental dose-optimization program which includes automated exposure control, adjustment of the mA and/or kV according to patient size and/or use of iterative reconstruction technique. COMPARISON:  None Available. FINDINGS: Brain: The ventricles and sulci are appropriate size for the patient's age. The gray-white matter discrimination is preserved. There is no acute intracranial hemorrhage. No mass effect or midline shift. No extra-axial fluid collection. Vascular: No hyperdense vessel or unexpected calcification. Skull: Normal. Negative for fracture or focal lesion. Sinuses/Orbits: No acute finding. Other: None IMPRESSION: No acute intracranial pathology. Electronically Signed   By: Anner Crete M.D.   On: 07/26/2021 01:38    Procedures Procedures    Medications Ordered in ED Medications - No data to display  ED Course/ Medical Decision Making/ A&P                           Medical Decision Making Amount and/or Complexity of Data Reviewed Radiology: ordered.   This patient presents to the ED for concern of headache, scalp pain, this involves an extensive number of treatment options, and is a complaint that carries with it a high risk of complications and morbidity.  I considered the following differential and admission for this acute, potentially life threatening condition.  The differential  diagnosis includes nerve pain, atypical migraine, less likely subarachnoid hemorrhage or intracranial bleed  MDM:    This is a 71 year old female who reports intermittent sharp scalp discomfort on the right.  She is nontoxic and vital signs are reassuring.  These episodes are self-limited and she is currently symptoms free.  Her neuro exam is normal and she denies any strokelike symptoms.  I discussed with the patient that her symptoms were not really consistent with any emergent entity I could think of.  She has no overlying skin changes or scalp changes to suggest irritation or infection.  Given the self-limited brief nature of the symptoms, would have low suspicion for intermittent migraine.  Somewhat suggestive of a neuralgia type pain.  Patient was concerned regarding her symptoms.  Given her age, we  did obtain a CT scan to ensure that there was no bleed or other intracranial process.  This was reviewed by myself and is negative.  Patient reassured.  Recommend that she keep notes regarding her symptoms if they were to return.  She was given strict return precautions.  (Labs, imaging, consults)  Labs: I Ordered, and personally interpreted labs.  The pertinent results include: None  Imaging Studies ordered: I ordered imaging studies including CT head I independently visualized and interpreted imaging. I agree with the radiologist interpretation  Additional history obtained from friend at bedside.  External records from outside source obtained and reviewed including prior evaluation  Cardiac Monitoring: The patient was maintained on a cardiac monitor.  I personally viewed and interpreted the cardiac monitored which showed an underlying rhythm of: Normal sinus rhythm  Reevaluation: After the interventions noted above, I reevaluated the patient and found that they have :resolved  Social Determinants of Health: Elderly, lives independently  Disposition: Discharge  Co morbidities that  complicate the patient evaluation  Past Medical History:  Diagnosis Date   Cataract    Combined form OU   Family history of adverse reaction to anesthesia    sister had PONV   Female bladder prolapse    Hyperlipidemia    Hypertension    Hypertensive retinopathy    OU   Macular hole of right eye    Osteoarthritis      Medicines No orders of the defined types were placed in this encounter.   I have reviewed the patients home medicines and have made adjustments as needed  Problem List / ED Course: Problem List Items Addressed This Visit   None Visit Diagnoses     Acute nonintractable headache, unspecified headache type    -  Primary                   Final Clinical Impression(s) / ED Diagnoses Final diagnoses:  Acute nonintractable headache, unspecified headache type    Rx / DC Orders ED Discharge Orders     None         Merryl Hacker, MD 07/26/21 0210

## 2021-09-11 ENCOUNTER — Ambulatory Visit: Payer: Medicare HMO | Admitting: Adult Health

## 2021-09-12 ENCOUNTER — Ambulatory Visit: Payer: Medicare HMO | Admitting: Adult Health

## 2021-09-17 ENCOUNTER — Ambulatory Visit: Payer: Medicare HMO | Admitting: Adult Health

## 2021-09-22 ENCOUNTER — Ambulatory Visit: Payer: Medicare HMO | Admitting: Adult Health

## 2021-09-22 ENCOUNTER — Encounter: Payer: Self-pay | Admitting: Adult Health

## 2021-09-22 VITALS — BP 150/84 | HR 60 | Ht 66.0 in | Wt 195.5 lb

## 2021-09-22 DIAGNOSIS — N362 Urethral caruncle: Secondary | ICD-10-CM

## 2021-09-22 DIAGNOSIS — Z4689 Encounter for fitting and adjustment of other specified devices: Secondary | ICD-10-CM

## 2021-09-22 DIAGNOSIS — N813 Complete uterovaginal prolapse: Secondary | ICD-10-CM

## 2021-09-22 NOTE — Progress Notes (Signed)
  Subjective:     Patient ID: Catherine Carney, female   DOB: Sep 07, 1950, 71 y.o.   MRN: [id number]  HPI Catherine Carney is a 71 year old white female, married, PM in for pessary maintenance, she denies any bleeding or discharge PCP is Dr Melford Aase  Review of Systems For pessary maintenance Denies any bleeding or discahrge Reviewed past medical,surgical, social and family history. Reviewed medications and allergies.     Objective:   Physical Exam BP (!) 150/84 (BP Location: Left Arm, Patient Position: Sitting, Cuff Size: Normal)   Pulse 60   Ht '5\' 6"'$  (1.676 m)   Wt 195 lb 8 oz (88.7 kg)   BMI 31.55 kg/m     Skin warm and dry.Pelvic: external genitalia is normal in appearance no lesions, vagina: pessary,#5 milex ring with support, easily removed, washed with soap and water and dried,no lesion in vagina or irritation, pessary easily inserted,urethra has caruncle, cervix:smooth,uterus: normal size, shape and contour, non tender, no masses felt,+prolapse,  adnexa: no masses or tenderness noted. Bladder is non tender and no masses felt.   Examination chaperoned by Levy Pupa LPN Fall risk is low  Upstream - 09/22/21 1048       Pregnancy Intention Screening   Does the patient want to become pregnant in the next year? No    Does the patient's partner want to become pregnant in the next year? No    Would the patient like to discuss contraceptive options today? No      Contraception Wrap Up   Current Method Female Sterilization    End Method Female Sterilization    Contraception Counseling Provided No             Assessment:     1. Encounter for pessary maintenance Milex ring #5 with support  2. Uterine procidentia   3. Urethral caruncle    Plan:     Follow up in 4 months for pessary maintenance or sooner if needed

## 2022-01-22 ENCOUNTER — Ambulatory Visit: Payer: Medicare HMO | Admitting: Adult Health

## 2022-01-26 ENCOUNTER — Ambulatory Visit: Payer: Medicare HMO | Admitting: Adult Health

## 2022-02-11 ENCOUNTER — Other Ambulatory Visit (HOSPITAL_COMMUNITY): Payer: Self-pay | Admitting: Family Medicine

## 2022-02-11 DIAGNOSIS — Z1231 Encounter for screening mammogram for malignant neoplasm of breast: Secondary | ICD-10-CM

## 2022-02-17 ENCOUNTER — Encounter: Payer: Self-pay | Admitting: Adult Health

## 2022-02-17 ENCOUNTER — Ambulatory Visit: Payer: Medicare HMO | Admitting: Adult Health

## 2022-02-17 VITALS — BP 152/90 | HR 63 | Ht 66.0 in | Wt 193.5 lb

## 2022-02-17 DIAGNOSIS — N362 Urethral caruncle: Secondary | ICD-10-CM

## 2022-02-17 DIAGNOSIS — Z4689 Encounter for fitting and adjustment of other specified devices: Secondary | ICD-10-CM

## 2022-02-17 DIAGNOSIS — N9089 Other specified noninflammatory disorders of vulva and perineum: Secondary | ICD-10-CM | POA: Insufficient documentation

## 2022-02-17 DIAGNOSIS — N813 Complete uterovaginal prolapse: Secondary | ICD-10-CM | POA: Diagnosis not present

## 2022-02-17 MED ORDER — NYSTATIN 100000 UNIT/GM EX CREA
1.0000 | TOPICAL_CREAM | Freq: Two times a day (BID) | CUTANEOUS | 99 refills | Status: AC | PRN
Start: 2022-02-17 — End: ?

## 2022-02-17 NOTE — Progress Notes (Signed)
  Subjective:     Patient ID: Catherine Carney, female   DOB: 1951-01-24, 72 y.o.   MRN: [id number]  HPI Catherine Carney is a 72 year old white female, married, PM in for pessary maintenance, she denies any bleeding or discharge,but feels like uterus coming around pessary. PCP is Dr Melford Aase  Review of Systems For pessary maintenance Denies any bleeding or discharge Feels like uterus coming around pessary  Reviewed past medical,surgical, social and family history. Reviewed medications and allergies    Objective:   Physical Exam BP (!) 152/90 (BP Location: Right Arm, Patient Position: Sitting, Cuff Size: Normal)   Pulse 63   Ht '5\' 6"'$  (1.676 m)   Wt 193 lb 8 oz (87.8 kg)   BMI 31.23 kg/m     Skin warm and dry.Pelvic: external genitalia is red and irritated,vagina: pessary,#5 milex ring with support, easily removed, washed with soap and water and dried,no lesion in vagina or irritation,fitted with # 6 pessary, and asked to walk around and squat, felt good, Dr Nelda Marseille in and agrees with #6, removed fitting pessary and #6 ring with support  pessary easily inserted,urethra has caruncle, cervix:smooth,uterus: normal size, shape and contour, non tender, no masses felt,+prolapse,  adnexa: no masses or tenderness noted. Bladder is non tender and no masses felt.   Examination chaperoned by Levy Pupa LPN Fall risk is low  Upstream - 02/17/22 1015       Pregnancy Intention Screening   Does the patient want to become pregnant in the next year? N/A    Does the patient's partner want to become pregnant in the next year? N/A    Would the patient like to discuss contraceptive options today? N/A      Contraception Wrap Up   Current Method Female Sterilization    End Method Female Sterilization    Contraception Counseling Provided No             Assessment:     1. Pessary maintenance, Milex ring with support #6 Will try milex ring #6, she says uterus coming around #5  2. Uterine procidentia Dr Nelda Marseille talked  with her about seeing Dr Wannetta Sender, about surgery if desired.  3. Urethral caruncle  4. Vulvar irritation Red and irritated, wears pad and feels damp Will refill nystatin cream, but also try aquaphor 3N1 Meds ordered this encounter  Medications   nystatin cream (MYCOSTATIN)    Sig: Apply 1 Application topically 2 (two) times daily as needed for dry skin.    Dispense:  30 g    Refill:  PRN    Order Specific Question:   Supervising Provider    Answer:   Florian Buff [[id number]]       Plan:     Follow up in 4 weeks for recheck and ROS

## 2022-02-19 ENCOUNTER — Ambulatory Visit (HOSPITAL_COMMUNITY)
Admission: RE | Admit: 2022-02-19 | Discharge: 2022-02-19 | Disposition: A | Discharge status: Home / Self Care | Payer: Medicare HMO | Source: Ambulatory Visit | Attending: Family Medicine | Admitting: Family Medicine

## 2022-02-19 DIAGNOSIS — Z1231 Encounter for screening mammogram for malignant neoplasm of breast: Secondary | ICD-10-CM | POA: Insufficient documentation

## 2022-03-17 ENCOUNTER — Encounter: Payer: Self-pay | Admitting: Adult Health

## 2022-03-17 ENCOUNTER — Ambulatory Visit: Payer: Medicare HMO | Admitting: Adult Health

## 2022-03-17 VITALS — BP 151/85 | HR 62 | Ht 66.0 in | Wt 195.0 lb

## 2022-03-17 DIAGNOSIS — Z4689 Encounter for fitting and adjustment of other specified devices: Secondary | ICD-10-CM | POA: Diagnosis not present

## 2022-03-17 DIAGNOSIS — N813 Complete uterovaginal prolapse: Secondary | ICD-10-CM

## 2022-03-17 DIAGNOSIS — N9089 Other specified noninflammatory disorders of vulva and perineum: Secondary | ICD-10-CM | POA: Diagnosis not present

## 2022-03-17 NOTE — Progress Notes (Signed)
  Subjective:     Patient ID: Catherine Carney, female   DOB: Jul 17, 1950, 72 y.o.   MRN: [id number]  HPI Catherine Carney is a 72 year old white female, married, PM in for pessary check, she denies any bleeding or discharge,but feels like uterus coming around pessary. PCP is Dr Melford Aase  Review of Systems   Denies any bleeding or discharge Feels like uterus coming around pessary  Reviewed past medical,surgical, social and family history. Reviewed medications and allergies   Objective:   Physical Exam BP (!) 151/85 (BP Location: Right Arm, Patient Position: Sitting, Cuff Size: Normal)   Pulse 62   Ht '5\' 6"'$  (1.676 m)   Wt 195 lb (88.5 kg)   BMI 31.47 kg/m     Skin warm and dry.Pelvic: external genitalia is red and irritated,vagina: pessary,#6 milex ring with support, easily removed, washed with soap and water and dried,no lesion in vagina or irritation,fitted with # 7 pessary, and asked to walk around and squat, felt good, no difference, Dr Elonda Husky in and said fit but had moved sideways, pt says just keep #6, #6 ring with support  pessary easily inserted,urethra has caruncle, cervix:smooth,uterus: normal size, shape and contour, non tender, no masses felt,+prolapse,  adnexa: no masses or tenderness noted. Bladder is non tender and no masses felt.   Examination chaperoned by Levy Pupa LPN  Upstream - 76/15/18 1100       Pregnancy Intention Screening   Does the patient want to become pregnant in the next year? N/A    Does the patient's partner want to become pregnant in the next year? N/A    Would the patient like to discuss contraceptive options today? N/A      Contraception Wrap Up   Current Method Female Sterilization    End Method Female Sterilization    Contraception Counseling Provided No             Assessment:     1. Pessary maintenance, Milex ring with support #6  2. Uterine procidentia   3. Vulvar irritation Use nystatin cream and get Aquaphor 3 N 1    Plan:      Follow in 4  months for pessary maintenance

## 2022-04-01 ENCOUNTER — Other Ambulatory Visit: Payer: Self-pay | Admitting: Internal Medicine

## 2022-04-01 ENCOUNTER — Encounter: Payer: Self-pay | Admitting: Internal Medicine

## 2022-05-21 ENCOUNTER — Ambulatory Visit: Payer: Medicare HMO | Admitting: Internal Medicine

## 2022-05-21 ENCOUNTER — Encounter: Payer: Self-pay | Admitting: Internal Medicine

## 2022-05-21 VITALS — BP 133/77 | HR 72 | Temp 97.8°F | Ht 66.0 in | Wt 194.4 lb

## 2022-05-21 DIAGNOSIS — Z86010 Personal history of colonic polyps: Secondary | ICD-10-CM

## 2022-05-21 DIAGNOSIS — Z800 Family history of malignant neoplasm of digestive organs: Secondary | ICD-10-CM | POA: Diagnosis not present

## 2022-05-21 DIAGNOSIS — K219 Gastro-esophageal reflux disease without esophagitis: Secondary | ICD-10-CM

## 2022-05-21 MED ORDER — OMEPRAZOLE 40 MG PO CPDR: 40.0000 mg | DELAYED_RELEASE_CAPSULE | Freq: Every day | ORAL | 3 refills | Status: DC

## 2022-05-21 NOTE — Patient Instructions (Signed)
I am happy to hear that you are doing well.  Continue on omeprazole daily for your chronic reflux.  I sent you in a year supply of refills today.  We can discuss pros and cons of repeat colonoscopy in 2028.  Follow-up in 1 year or sooner if needed.  It was very nice seeing you again today.  Dr. Marletta Lor

## 2022-05-21 NOTE — Progress Notes (Signed)
Referring Provider: Eartha InchBadger, Michael C, MD Primary Care Physician:  Eartha InchBadger, Michael C, MD Primary GI:  Dr. Marletta Lorarver  Chief Complaint  Patient presents with   Follow-up    Follow up GERD and colonoscopy    HPI:   Catherine Carney is a 72 y.o. female who presents to clinic today for follow-up visit.  History of chronic GERD well-controlled on omeprazole daily.  No breakthrough acid reflux or heartburn.  No dysphagia odynophagia.  No epigastric or chest pain.  History of numerous adenomatous colon polyps, multiple large ascending unresectable polyps status post right hemicolectomy.  Family history of colon cancer in sister.  Last colonoscopy 05/27/2021 with 1 sessile serrated polyp descending colon.  Recommended 5-year recall if benefits outweigh risk.  Today states she is doing great.  No GI complaints.  Bowels moving well.  Past Medical History:  Diagnosis Date   Cataract    Combined form OU   Family history of adverse reaction to anesthesia    sister had PONV   Female bladder prolapse    Hyperlipidemia    Hypertension    Hypertensive retinopathy    OU   Macular hole of right eye    Osteoarthritis     Past Surgical History:  Procedure Laterality Date   25 GAUGE PARS PLANA VITRECTOMY WITH 20 GAUGE MVR PORT FOR MACULAR HOLE Right 04/20/2019   Procedure: 25 GAUGE PARS PLANA VITRECTOMY WITH 20 GAUGE MVR PORT FOR MACULAR HOLE WITH MEMBRANE PEEL;  Surgeon: Rennis ChrisZamora, Brian, MD;  [facility];  Service: Ophthalmology;  Laterality: Right;   BIOPSY  11/17/2019   Procedure: BIOPSY;  Surgeon: Lanelle Balarver, Justinian Miano K, DO;  Location: [facility];  Service: Endoscopy;;   CATARACT EXTRACTION Right    CHOLECYSTECTOMY     COLONOSCOPY WITH PROPOFOL N/A 11/17/2019   Surgeon: Lanelle Balarver, Sander Speckman K, DO; nonbleeding internal hemorrhoids, 13 mm tubular adenoma in the cecum, 11 mm tubular adenoma at the ileocecal valve, multiple large ascending unresectable colon polyps left behind. Referred to general  suregery for right hemicolectomy. Repeat TCS in 1 year.   COLONOSCOPY WITH PROPOFOL N/A 05/27/2021   Procedure: COLONOSCOPY WITH PROPOFOL;  Surgeon: Lanelle Balarver, Varsha Knock K, DO;  Location: [facility];  Service: Endoscopy;  Laterality: N/A;  1:45pm   ESOPHAGOGASTRODUODENOSCOPY (EGD) WITH PROPOFOL N/A 11/17/2019   Surgeon: Lanelle Balarver, Teisha Trowbridge K, DO;  small hiatal hernia, grade a reflux esophagitis, gastritis biopsied, normal examined duodenum.  Pathology with mild nonspecific reactive gastropathy, negative for H. pylori.   EYE SURGERY Right 04/20/2019   [facility] repair - PPV/MP - Dr. Rennis ChrisBrian Zamora   GALLBLADDER SURGERY     GAS/FLUID EXCHANGE Right 04/20/2019   Procedure: Gas/Fluid Exchange;  Surgeon: Rennis ChrisZamora, Brian, MD;  [facility];  Service: Ophthalmology;  Laterality: Right;   HEMOSTASIS CLIP PLACEMENT  11/17/2019   Procedure: HEMOSTASIS CLIP PLACEMENT;  Surgeon: Lanelle Balarver, Ferrell Claiborne K, DO;  Location: [facility];  Service: Endoscopy;;   PARTIAL COLECTOMY N/A 12/25/2019   Procedure: PARTIAL COLECTOMY;  Surgeon: Franky MachoJenkins, Mark, MD;  Location: [facility] ORS;  Service: General;  Laterality: N/A;   PHOTOCOAGULATION WITH LASER Right 04/20/2019   Procedure: Photocoagulation With Laser;  Surgeon: Rennis ChrisZamora, Brian, MD;  [facility];  Service: Ophthalmology;  Laterality: Right;   POLYPECTOMY  11/17/2019   Procedure: POLYPECTOMY;  Surgeon: Lanelle Balarver, Daimen Shovlin K, DO;  Location: [facility];  Service: Endoscopy;;   POLYPECTOMY  05/27/2021   Procedure: POLYPECTOMY;  Surgeon: Lanelle Balarver, Kari Kerth K, DO;  Location: [facility];  Service: Endoscopy;;   TUBAL LIGATION      Current Outpatient Medications  Medication Sig Dispense Refill   acetaminophen (TYLENOL) 500 MG tablet Take 500 mg by mouth every 6 (six) hours as needed (for pain.).      ARTIFICIAL TEAR SOLUTION OP Place 1 drop into both eyes daily as needed (dry eyes).     aspirin EC 81 MG tablet Take 81 mg by mouth daily.     Calcium Carb-Cholecalciferol (CALCIUM 600+D)  600-800 MG-UNIT TABS Take 1 tablet by mouth daily.     Coenzyme Q10 (COQ10) 100 MG CAPS Take 100 mg by mouth daily.     metoprolol succinate (TOPROL-XL) 50 MG 24 hr tablet Take 50 mg by mouth daily.     Multiple Vitamin (MULTIVITAMIN WITH MINERALS) TABS tablet Take 1 tablet by mouth daily. Women's Multivitamin 50+     nystatin cream (MYCOSTATIN) Apply 1 Application topically 2 (two) times daily as needed for dry skin. 30 g PRN   Omega 3-6-9 Fatty Acids (TRIPLE OMEGA-3-6-9 PO) Take 1 capsule by mouth daily.     omeprazole (PRILOSEC) 40 MG capsule TAKE 1 CAPSULE BY MOUTH ONCE DAILY BEFORE BREAKFAST 90 capsule 0   rosuvastatin (CRESTOR) 20 MG tablet Take 20 mg by mouth at bedtime.     valsartan (DIOVAN) 320 MG tablet Take 320 mg by mouth daily.     vitamin B-12 (CYANOCOBALAMIN) 500 MCG tablet Take 500 mcg by mouth daily.     No current facility-administered medications for this visit.    Allergies as of 05/21/2022   (No Known Allergies)    Family History  Problem Relation Age of Onset   Heart attack Maternal Grandmother    Cancer Maternal Grandfather    Cancer Father        lung   Other Mother        irregular heart beat   Hypertension Mother    Cancer Brother        lung, brain   Diabetes Brother    Hypertension Brother    High Cholesterol Brother    Atrial fibrillation Brother    Melanoma Brother        right ear   Hypertension Sister    High Cholesterol Sister    Colon cancer Sister 86   Rheum arthritis Sister    Other Sister        irregular heart beat   Hypertension Son    Colon cancer Maternal Aunt    Colon cancer Maternal Uncle    Cancer Other        maternal side   Stroke Other        maternal side    Social History   Socioeconomic History   Marital status: Married    Spouse name: Not on file   Number of children: Not on file   Years of education: Not on file   Highest education level: Not on file  Occupational History   Not on file  Tobacco Use    Smoking status: Never   Smokeless tobacco: Never  Vaping Use   Vaping Use: Never used  Substance and Sexual Activity   Alcohol use: No    Alcohol/week: 0.0 standard drinks of alcohol   Drug use: No   Sexual activity: Not Currently    Birth control/protection: Surgical, Post-menopausal    Comment: tubal  Other Topics Concern   Not on file  Social History Narrative   Not on file   Social  Determinants of Health   Financial Resource Strain: Not on file  Food Insecurity: Not on file  Transportation Needs: Not on file  Physical Activity: Not on file  Stress: Not on file  Social Connections: Not on file    Subjective: Review of Systems  Constitutional:  Negative for chills and fever.  HENT:  Negative for congestion and hearing loss.   Eyes:  Negative for blurred vision and double vision.  Respiratory:  Negative for cough and shortness of breath.   Cardiovascular:  Negative for chest pain and palpitations.  Gastrointestinal:  Negative for abdominal pain, blood in stool, constipation, diarrhea, heartburn, melena and vomiting.  Genitourinary:  Negative for dysuria and urgency.  Musculoskeletal:  Negative for joint pain and myalgias.  Skin:  Negative for itching and rash.  Neurological:  Negative for dizziness and headaches.  Psychiatric/Behavioral:  Negative for depression. The patient is not nervous/anxious.      Objective: BP 133/77   Pulse 72   Temp 97.8 F (36.6 C)   Ht 5\' 6"  (1.676 m)   Wt 194 lb 6.4 oz (88.2 kg)   BMI 31.38 kg/m  Physical Exam Constitutional:      Appearance: Normal appearance.  HENT:     Head: Normocephalic and atraumatic.  Eyes:     Extraocular Movements: Extraocular movements intact.     Conjunctiva/sclera: Conjunctivae normal.  Cardiovascular:     Rate and Rhythm: Normal rate and regular rhythm.  Pulmonary:     Effort: Pulmonary effort is normal.     Breath sounds: Normal breath sounds.  Abdominal:     General: Bowel sounds are normal.      Palpations: Abdomen is soft.  Musculoskeletal:        General: No swelling. Normal range of motion.     Cervical back: Normal range of motion and neck supple.  Skin:    General: Skin is warm and dry.     Coloration: Skin is not jaundiced.  Neurological:     General: No focal deficit present.     Mental Status: She is alert and oriented to person, place, and time.  Psychiatric:        Mood and Affect: Mood normal.        Behavior: Behavior normal.      Assessment: *Chronic GERD-well controlled on Omeprazole daily *Adenomatous colon polyps  Plan: Chronic GERD well-controlled on omeprazole daily.  Continue.  Refill today.  Colonoscopy recall 2028.  Will discuss risk versus benefits at that time.  Follow-up in 1 year or sooner if needed.  05/21/2022 3:33 PM   Disclaimer: This note was dictated with voice recognition software. Similar sounding words can inadvertently be transcribed and may not be corrected upon review.

## 2022-06-18 NOTE — Progress Notes (Addendum)
[facility] - Clinic Note  06/24/2022    CHIEF COMPLAINT Patient presents for Retina Follow Up  HISTORY OF PRESENT ILLNESS: Catherine Carney is a 72 y.o. female who presents to the clinic today for:   HPI     Retina Follow Up   Patient presents with  Other.  In right eye.  This started 1.  Duration of years.  I, the attending physician,  performed the HPI with the patient and updated documentation appropriately.        Comments   Patient here for 1 year retina follow up for macular hole OD. Patient states vision about the same. Last week OD had a little eye pain. It was quick and then over. Had RSV in March. On Prednisone for 5 days. Antibiotic for 10 days and inhaler for 2 weeks. Sees doctor next week.       Last edited by Rennis Chris, MD on 06/24/2022  4:57 PM.    Pt is seeing Dr. Dione Booze for routine eye exams / glasses  Referring physician: Sallye Lat, MD [address] [place],  [place] [address]  HISTORICAL INFORMATION:   Selected notes from the MEDICAL RECORD NUMBER Referred by Dr Mayford Knife for retina eval   CURRENT MEDICATIONS: Current Outpatient Medications (Ophthalmic Drugs)  Medication Sig   ARTIFICIAL TEAR SOLUTION OP Place 1 drop into both eyes daily as needed (dry eyes).   No current facility-administered medications for this visit. (Ophthalmic Drugs)   Current Outpatient Medications (Other)  Medication Sig   acetaminophen (TYLENOL) 500 MG tablet Take 500 mg by mouth every 6 (six) hours as needed (for pain.).    aspirin EC 81 MG tablet Take 81 mg by mouth daily.   Calcium Carb-Cholecalciferol (CALCIUM 600+D) 600-800 MG-UNIT TABS Take 1 tablet by mouth daily.   Coenzyme Q10 (COQ10) 100 MG CAPS Take 100 mg by mouth daily.   metoprolol succinate (TOPROL-XL) 50 MG 24 hr tablet Take 50 mg by mouth daily.   Multiple Vitamin (MULTIVITAMIN WITH MINERALS) TABS tablet Take 1 tablet by mouth daily. Women's Multivitamin 50+   nystatin  cream (MYCOSTATIN) Apply 1 Application topically 2 (two) times daily as needed for dry skin.   Omega 3-6-9 Fatty Acids (TRIPLE OMEGA-3-6-9 PO) Take 1 capsule by mouth daily.   omeprazole (PRILOSEC) 40 MG capsule Take 1 capsule (40 mg total) by mouth daily.   rosuvastatin (CRESTOR) 20 MG tablet Take 20 mg by mouth at bedtime.   valsartan (DIOVAN) 320 MG tablet Take 320 mg by mouth daily.   vitamin B-12 (CYANOCOBALAMIN) 500 MCG tablet Take 500 mcg by mouth daily.   No current facility-administered medications for this visit. (Other)   REVIEW OF SYSTEMS: ROS   Positive for: Gastrointestinal, Musculoskeletal, Eyes Negative for: Constitutional, Neurological, Skin, Genitourinary, HENT, Endocrine, Cardiovascular, Respiratory, Psychiatric, Allergic/Imm, Heme/Lymph Last edited by Laddie Aquas, COA on 06/24/2022  9:36 AM.      ALLERGIES No Known Allergies  PAST MEDICAL HISTORY Past Medical History:  Diagnosis Date   Cataract    Combined form OU   Family history of adverse reaction to anesthesia    sister had PONV   Female bladder prolapse    Hyperlipidemia    Hypertension    Hypertensive retinopathy    OU   Macular hole of right eye    Osteoarthritis    Past Surgical History:  Procedure Laterality Date   25 GAUGE PARS PLANA VITRECTOMY WITH 20 GAUGE MVR PORT  FOR MACULAR HOLE Right 04/20/2019   Procedure: 25 GAUGE PARS PLANA VITRECTOMY WITH 20 GAUGE MVR PORT FOR MACULAR HOLE WITH MEMBRANE PEEL;  Surgeon: Rennis Chris, MD;  Location: [facility] OR;  Service: Ophthalmology;  Laterality: Right;   BIOPSY  11/17/2019   Procedure: BIOPSY;  Surgeon: Lanelle Bal, DO;  [facility]: [facility];  Service: Endoscopy;;   CATARACT EXTRACTION Right    CHOLECYSTECTOMY     COLONOSCOPY WITH PROPOFOL N/A 11/17/2019   Surgeon: Lanelle Bal, DO; nonbleeding internal hemorrhoids, 13 mm tubular adenoma in the cecum, 11 mm tubular adenoma at the ileocecal valve, multiple large ascending unresectable  colon polyps left behind. Referred to general suregery for right hemicolectomy. Repeat TCS in 1 year.   COLONOSCOPY WITH PROPOFOL N/A 05/27/2021   Procedure: COLONOSCOPY WITH PROPOFOL;  Surgeon: Lanelle Bal, DO;  Location: [facility];  Service: Endoscopy;  Laterality: N/A;  1:45pm   ESOPHAGOGASTRODUODENOSCOPY (EGD) WITH PROPOFOL N/A 11/17/2019   Surgeon: Lanelle Bal, DO;  small hiatal hernia, grade a reflux esophagitis, gastritis biopsied, normal examined duodenum.  Pathology with mild nonspecific reactive gastropathy, negative for H. pylori.   EYE SURGERY Right 04/20/2019   [facility] repair - PPV/MP - Dr. Rennis Chris   GALLBLADDER SURGERY     GAS/FLUID EXCHANGE Right 04/20/2019   Procedure: Gas/Fluid Exchange;  Surgeon: Rennis Chris, MD;  Location: [facility] OR;  Service: Ophthalmology;  Laterality: Right;   HEMOSTASIS CLIP PLACEMENT  11/17/2019   Procedure: HEMOSTASIS CLIP PLACEMENT;  Surgeon: Lanelle Bal, DO;  [facility]: [facility];  Service: Endoscopy;;   PARTIAL COLECTOMY N/A 12/25/2019   Procedure: PARTIAL COLECTOMY;  Surgeon: Franky Macho, MD;  Location: [facility];  Service: General;  Laterality: N/A;   PHOTOCOAGULATION WITH LASER Right 04/20/2019   Procedure: Photocoagulation With Laser;  Surgeon: Rennis Chris, MD;  Location: [facility] OR;  Service: Ophthalmology;  Laterality: Right;   POLYPECTOMY  11/17/2019   Procedure: POLYPECTOMY;  Surgeon: Lanelle Bal, DO;  [facility]: [facility];  Service: Endoscopy;;   POLYPECTOMY  05/27/2021   Procedure: POLYPECTOMY;  Surgeon: Lanelle Bal, DO;  [facility];  Service: Endoscopy;;   TUBAL LIGATION     FAMILY HISTORY Family History  Problem Relation Age of Onset   Heart attack Maternal Grandmother    Cancer Maternal Grandfather    Cancer Father        lung   Other Mother        irregular heart beat   Hypertension Mother    Cancer Brother        lung, brain   Diabetes Brother    Hypertension Brother    High  Cholesterol Brother    Atrial fibrillation Brother    Melanoma Brother        right ear   Hypertension Sister    High Cholesterol Sister    Colon cancer Sister 87   Rheum arthritis Sister    Other Sister        irregular heart beat   Hypertension Son    Colon cancer Maternal Aunt    Colon cancer Maternal Uncle    Cancer Other        maternal side   Stroke Other        maternal side   SOCIAL HISTORY Social History   Tobacco Use   Smoking status: Never   Smokeless tobacco: Never  Vaping Use   Vaping Use: Never used  Substance Use Topics  Alcohol use: No    Alcohol/week: 0.0 standard drinks of alcohol   Drug use: No       OPHTHALMIC EXAM:  Base Eye Exam     Visual Acuity (Snellen - Linear)       Right Left   Dist cc 20/20 -1 20/20 -1         Tonometry (Tonopen, 9:32 AM)       Right Left   Pressure 17 16         Pupils       Dark Light Shape React APD   Right 3 2 Round Brisk None   Left 3 2 Round Brisk None         Visual Fields (Counting fingers)       Left Right    Full Full         Extraocular Movement       Right Left    Full, Ortho Full, Ortho         Neuro/Psych     Oriented x3: Yes   Mood/Affect: Normal         Dilation     Both eyes: 1.0% Mydriacyl, 2.5% Phenylephrine @ 9:31 AM           Slit Lamp and Fundus Exam     External Exam       Right Left   External Normal          Slit Lamp Exam       Right Left   Lids/Lashes Dermatochalasis - upper lid, mild MGD Dermatochalasis - upper lid, Meibomian gland dysfunction   Conjunctiva/Sclera White and quiet White and quiet   Cornea Trace Punctate epithelial erosions, well healed temporal cataract wounds trace tear film debris   Anterior Chamber deep, clear, narrow temporal angle Deep and quiet   Iris Round and dilated Round and dilated   Lens PC IOL in good position with open PC 2+ Nuclear sclerosis, 2+ Cortical cataract   Anterior Vitreous post vitrectomy,  clear Vitreous syneresis         Fundus Exam       Right Left   Disc mild Pallor, Sharp rim, mild PPA/PPP, +cupping Pink and Sharp, mild PPA/PPP   C/D Ratio 0.65 0.4   Macula flat; mac hole closed, Blunted foveal reflex, mild RPE mottling, No heme or edema Flat, good foveal reflex, mild RPE mottling, central cystic changes - resolved, No heme or edema   Vessels mild attenuation, mild tortuosity mild attenuation, mild tortuosity, mild AV crossing changes   Periphery attached, good 360 laser changes, No heme Attached, no heme           Refraction     Wearing Rx       Sphere Cylinder Axis Add   Right +0.00 +0.75 170 +2.75   Left +1.00 +0.75 011 +2.75           IMAGING AND PROCEDURES  Imaging and Procedures for @TODAY @  OCT, Retina - OU - Both Eyes       Right Eye Quality was good. Central Foveal Thickness: 286. Progression has been stable. Findings include no IRF, no SRF, abnormal foveal contour (Mac hole closed, irregular inner retinal surface).   Left Eye Quality was good. Central Foveal Thickness: 274. Progression has been stable. Findings include no IRF, no SRF, abnormal foveal contour (Stable improvement in central cystic changes, partial PVD).   Notes *Images captured and stored on drive  Diagnosis / Impression:  OD: Mac  hole closed; irregular inner retinal surface; no IRF/SRF OS: Stable improvement in central cystic changes, partial PVD  Clinical management:  See below  Abbreviations: NFP - Normal foveal profile. CME - cystoid macular edema. PED - pigment epithelial detachment. IRF - intraretinal fluid. SRF - subretinal fluid. EZ - ellipsoid zone. ERM - epiretinal membrane. ORA - outer retinal atrophy. ORT - outer retinal tubulation. SRHM - subretinal hyper-reflective material            ASSESSMENT/PLAN:    ICD-10-CM   1. Macular hole of right eye  H35.341     2. Essential hypertension  I10     3. Hypertensive retinopathy of both eyes  H35.033  OCT, Retina - OU - Both Eyes    4. Combined forms of age-related cataract of left eye  H25.812     5. Pseudophakia  Z96.1     6. Vitreomacular traction, left  H43.822      1. Full thickness macular hole OD  - s/p PPV/TissueBlue stain/MP/14% C3F8 OD, 03.11.21             - doing well             - mac hole closed  - BCVA now 20/20 OD post cataract surgery  - monitor  2,3. Hypertensive retinopathy OU  - discussed importance of tight BP control  - monitor  4. 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  - discussed diagnosis and progression  - under the expert management of Dr. Zetta Bills  5. Pseudophakia OD  - s/p CE/IOL OD (Dr. Zetta Bills, 07.08.21)  - IOL in excellent position, doing well  - monitor  6. VMT OS -- resolved / released as of May 2022  - OCT 05.15.24 shows residual partial PVD  - f/u 1 yr, sooner prn -- DFE, OCT  Ophthalmic Meds Ordered this visit:  No orders of the defined types were placed in this encounter.    Return in about 1 year (around 06/24/2023) for f/u FTMH OD, DFE, OCT.  There are no Patient Instructions on file for this visit.  This document serves as a record of services personally performed by Karie Chimera, MD, PhD. It was created on their behalf by De Blanch, an ophthalmic technician. The creation of this record is the provider's dictation and/or activities during the visit.    Electronically signed by: De Blanch, OA, 06/24/22  5:07 PM  This document serves as a record of services personally performed by Karie Chimera, MD, PhD. It was created on their behalf by Glee Arvin. Manson Passey, OA an ophthalmic technician. The creation of this record is the provider's dictation and/or activities during the visit.    Electronically signed by: Glee Arvin. Manson Passey, [place] 05.15.2024 5:07 PM  Karie Chimera, M.D., Ph.D. Diseases & Surgery of the Retina and  Vitreous [facility]  I have reviewed the above documentation for accuracy and completeness, and I agree with the above. Karie Chimera, M.D., Ph.D. 06/24/22 5:07 PM  Abbreviations: M myopia (nearsighted); A astigmatism; H hyperopia (farsighted); P presbyopia; Mrx spectacle prescription;  CTL contact lenses; OD right eye; OS left eye; OU both eyes  XT exotropia; ET esotropia; PEK punctate epithelial keratitis; PEE punctate epithelial erosions; DES dry eye syndrome; MGD meibomian gland dysfunction; ATs artificial tears; PFAT's preservative free artificial tears; NSC nuclear sclerotic cataract; PSC posterior subcapsular cataract;  ERM epi-retinal membrane; PVD posterior vitreous detachment; RD retinal detachment; DM diabetes mellitus; DR diabetic retinopathy; NPDR non-proliferative diabetic retinopathy; PDR proliferative diabetic retinopathy; CSME clinically significant macular edema; DME diabetic macular edema; dbh dot blot hemorrhages; CWS cotton wool spot; POAG primary open angle glaucoma; C/D cup-to-disc ratio; HVF humphrey visual field; GVF goldmann visual field; OCT optical coherence tomography; IOP intraocular pressure; BRVO Branch retinal vein occlusion; CRVO central retinal vein occlusion; CRAO central retinal artery occlusion; BRAO branch retinal artery occlusion; RT retinal tear; SB scleral buckle; PPV pars plana vitrectomy; VH Vitreous hemorrhage; PRP panretinal laser photocoagulation; IVK intravitreal kenalog; VMT vitreomacular traction; MH Macular hole;  NVD neovascularization of the disc; NVE neovascularization elsewhere; AREDS age related eye disease study; ARMD age related macular degeneration; POAG primary open angle glaucoma; EBMD epithelial/anterior basement membrane dystrophy; ACIOL anterior chamber intraocular lens; IOL intraocular lens; PCIOL posterior chamber intraocular lens; Phaco/IOL phacoemulsification with intraocular lens placement; PRK photorefractive  keratectomy; LASIK laser assisted in situ keratomileusis; HTN hypertension; DM diabetes mellitus; COPD chronic obstructive pulmonary disease

## 2022-06-24 ENCOUNTER — Encounter (INDEPENDENT_AMBULATORY_CARE_PROVIDER_SITE_OTHER): Payer: Self-pay | Admitting: Ophthalmology

## 2022-06-24 ENCOUNTER — Ambulatory Visit (INDEPENDENT_AMBULATORY_CARE_PROVIDER_SITE_OTHER): Payer: Medicare HMO | Admitting: Ophthalmology

## 2022-06-24 DIAGNOSIS — I10 Essential (primary) hypertension: Secondary | ICD-10-CM | POA: Diagnosis not present

## 2022-06-24 DIAGNOSIS — H35341 Macular cyst, hole, or pseudohole, right eye: Secondary | ICD-10-CM | POA: Diagnosis not present

## 2022-06-24 DIAGNOSIS — H35033 Hypertensive retinopathy, bilateral: Secondary | ICD-10-CM

## 2022-06-24 DIAGNOSIS — H43822 Vitreomacular adhesion, left eye: Secondary | ICD-10-CM

## 2022-06-24 DIAGNOSIS — Z961 Presence of intraocular lens: Secondary | ICD-10-CM

## 2022-06-24 DIAGNOSIS — H25812 Combined forms of age-related cataract, left eye: Secondary | ICD-10-CM | POA: Diagnosis not present

## 2022-06-24 DIAGNOSIS — H25813 Combined forms of age-related cataract, bilateral: Secondary | ICD-10-CM

## 2022-07-16 ENCOUNTER — Ambulatory Visit: Payer: Medicare HMO | Admitting: Adult Health

## 2022-07-16 ENCOUNTER — Encounter: Payer: Self-pay | Admitting: Adult Health

## 2022-07-16 VITALS — BP 140/77 | HR 60 | Ht 66.0 in | Wt 194.0 lb

## 2022-07-16 DIAGNOSIS — Z4689 Encounter for fitting and adjustment of other specified devices: Secondary | ICD-10-CM

## 2022-07-16 DIAGNOSIS — N813 Complete uterovaginal prolapse: Secondary | ICD-10-CM

## 2022-07-16 DIAGNOSIS — N362 Urethral caruncle: Secondary | ICD-10-CM | POA: Diagnosis not present

## 2022-07-16 DIAGNOSIS — N9089 Other specified noninflammatory disorders of vulva and perineum: Secondary | ICD-10-CM

## 2022-07-16 NOTE — Progress Notes (Signed)
  Subjective:     Patient ID: Catherine Carney, female   DOB: 1950-04-27, 72 y.o.   MRN: [id number]  HPI Catherine Carney is a 72 year old white female, married, PM in for pessary check, she denies any bleeding or discharge,but feels like uterus coming around pessary still, may have surgery in the fall to fix this.  PCP is Dr Cyndia Bent    Review of Systems Denies any bleeding or discharge Feels like uterus coming around pessary  Reviewed past medical,surgical, social and family history. Reviewed medications and allergies      Objective:   Physical Exam BP (!) 140/77 (BP Location: Left Arm, Patient Position: Sitting, Cuff Size: Normal)   Pulse 60   Ht 5\' 6"  (1.676 m)   Wt 194 lb (88 kg)   BMI 31.31 kg/m     Skin warm and dry.Pelvic: external genitalia is red and irritated, but better than last time,vagina: pessary,#6 milex ring with support, easily removed, has creamy discharge, without odor,washed with soap and water and dried,no lesion in vagina or irritation, #6 ring with support  pessary easily inserted,urethra has caruncle, cervix:smooth,uterus: normal size, shape and contour, non tender, no masses felt,+prolapse,  adnexa: no masses or tenderness noted. Bladder is non tender and no masses felt.   Examination chaperoned by Malachy Mood LP Fall risk is low  [facility] - 07/16/22 1126       Pregnancy Intention Screening   Does the patient want to become pregnant in the next year? N/A    Does the patient's partner want to become pregnant in the next year? N/A    Would the patient like to discuss contraceptive options today? N/A      Contraception Wrap Up   Current Method Female Sterilization    End Method Female Sterilization    Contraception Counseling Provided No             Assessment:      1. Pessary maintenance, Milex ring with support #6 Cleaned pessary and reinserted  2. Uterine procidentia  3. Urethral caruncle   4. Vulvar irritation Has used nystatin, but has not found  Aquaphor 3 N 1 cream, will try [facility]     Plan:     Follow up in 4 months for pessary maintenance or sooner if needed

## 2022-11-13 ENCOUNTER — Ambulatory Visit: Payer: Medicare HMO | Admitting: Adult Health

## 2022-11-13 ENCOUNTER — Encounter: Payer: Self-pay | Admitting: Adult Health

## 2022-11-13 VITALS — BP 148/83 | HR 57 | Ht 66.0 in | Wt 196.0 lb

## 2022-11-13 DIAGNOSIS — N362 Urethral caruncle: Secondary | ICD-10-CM

## 2022-11-13 DIAGNOSIS — N813 Complete uterovaginal prolapse: Secondary | ICD-10-CM | POA: Diagnosis not present

## 2022-11-13 DIAGNOSIS — Z4689 Encounter for fitting and adjustment of other specified devices: Secondary | ICD-10-CM

## 2022-11-13 NOTE — Progress Notes (Signed)
  Subjective:     Patient ID: Catherine Carney, female   DOB: 12/10/1950, 72 y.o.   MRN: [id number]  HPI Catherine Carney is a 72 year old white female, married, PM in for pessary check, she denies any bleeding or discharge,but feels like uterus coming around pessary still, may have surgery at [facility].  PCP is Dr Cyndia Bent  Review of Systems Denies any bleeding or discharge Feels like uterus coming around pessary  Reviewed past medical,surgical, social and family history. Reviewed medications and allergies    Objective:   Physical Exam BP (!) 148/83 (BP Location: Left Arm, Patient Position: Sitting, Cuff Size: Normal)   Pulse (!) 57   Ht 5\' 6"  (1.676 m)   Wt 196 lb (88.9 kg)   BMI 31.64 kg/m  BP was 132/72 yesterday at another doctor visit. Skin warm and dry.Pelvic: external genitalia has few red areas, but better than last time,vagina: pessary,#6 milex ring with support, easily removed, washed with soap and water and dried,no lesion in vagina or irritation, #6 ring with support  pessary easily inserted,urethra has caruncle, cervix:smooth,uterus: normal size, shape and contour, non tender, no masses felt,+prolapse,  adnexa: no masses or tenderness noted. Bladder is non tender and no masses felt.    Examination chaperoned by Freddie Apley RN Fall risk is low Assessment:     1. Pessary maintenance, Milex ring with support #6 Cleaned and reinserted   2. Uterine procidentia  3. Urethral caruncle     Plan:     Follow up in 4 months for pessary maintenance if not had sugery by then

## 2023-01-20 ENCOUNTER — Other Ambulatory Visit (HOSPITAL_COMMUNITY): Payer: Self-pay | Admitting: Physician Assistant

## 2023-01-20 DIAGNOSIS — Z1231 Encounter for screening mammogram for malignant neoplasm of breast: Secondary | ICD-10-CM

## 2023-02-22 ENCOUNTER — Ambulatory Visit (HOSPITAL_COMMUNITY)
Admission: RE | Admit: 2023-02-22 | Discharge: 2023-02-22 | Disposition: A | Discharge status: Home / Self Care | Payer: Medicare HMO | Source: Ambulatory Visit | Attending: Physician Assistant | Admitting: Physician Assistant

## 2023-02-22 ENCOUNTER — Encounter (HOSPITAL_COMMUNITY): Payer: Self-pay

## 2023-02-22 DIAGNOSIS — Z1231 Encounter for screening mammogram for malignant neoplasm of breast: Secondary | ICD-10-CM | POA: Diagnosis present

## 2023-03-16 ENCOUNTER — Ambulatory Visit: Payer: Medicare HMO | Admitting: Adult Health

## 2023-05-12 ENCOUNTER — Encounter: Payer: Self-pay | Admitting: Internal Medicine

## 2023-06-09 NOTE — Progress Notes (Shared)
 [facility] - Clinic Note  06/23/2023    CHIEF COMPLAINT Patient presents for No chief complaint on file.  HISTORY OF PRESENT ILLNESS: Catherine Carney is a 73 y.o. female who presents to the clinic today for:     Referring physician: Emaline Handsome, MD [address] [place],  [place] [address]  HISTORICAL INFORMATION:   Selected notes from the MEDICAL RECORD NUMBER Referred by Dr Micael Adas for retina eval   CURRENT MEDICATIONS: Current Outpatient Medications (Ophthalmic Drugs)  Medication Sig   ARTIFICIAL TEAR SOLUTION OP Place 1 drop into both eyes daily as needed (dry eyes).   No current facility-administered medications for this visit. (Ophthalmic Drugs)   Current Outpatient Medications (Other)  Medication Sig   acetaminophen  (TYLENOL ) 500 MG tablet Take 500 mg by mouth every 6 (six) hours as needed (for pain.).    aspirin EC 81 MG tablet Take 81 mg by mouth daily.   Calcium  Carb-Cholecalciferol (CALCIUM  600+D) 600-800 MG-UNIT TABS Take 1 tablet by mouth daily.   Coenzyme Q10 (COQ10) 100 MG CAPS Take 100 mg by mouth daily.   doxycycline (ADOXA) 100 MG tablet Take 100 mg by mouth 2 (two) times daily.   metoprolol  succinate (TOPROL -XL) 50 MG 24 hr tablet Take 50 mg by mouth daily.   Multiple Vitamin (MULTIVITAMIN WITH MINERALS) TABS tablet Take 1 tablet by mouth daily. Women's Multivitamin 50+   nystatin  cream (MYCOSTATIN ) Apply 1 Application topically 2 (two) times daily as needed for dry skin.   Omega 3-6-9 Fatty Acids (TRIPLE OMEGA-3-6-9 PO) Take 1 capsule by mouth daily.   omeprazole  (PRILOSEC) 40 MG capsule Take 1 capsule (40 mg total) by mouth daily.   rosuvastatin  (CRESTOR ) 20 MG tablet Take 20 mg by mouth at bedtime.   valsartan (DIOVAN) 320 MG tablet Take 320 mg by mouth daily.   vitamin B-12 (CYANOCOBALAMIN) 500 MCG tablet Take 500 mcg by mouth daily.   No current facility-administered medications for this visit. (Other)    REVIEW OF SYSTEMS:    ALLERGIES No Known Allergies  PAST MEDICAL HISTORY Past Medical History:  Diagnosis Date   Cataract    Combined form OU   Family history of adverse reaction to anesthesia    sister had PONV   Female bladder prolapse    Hyperlipidemia    Hypertension    Hypertensive retinopathy    OU   Macular hole of right eye    Osteoarthritis    Past Surgical History:  Procedure Laterality Date   25 GAUGE PARS PLANA VITRECTOMY WITH 20 GAUGE MVR PORT FOR MACULAR HOLE Right 04/20/2019   Procedure: 25 GAUGE PARS PLANA VITRECTOMY WITH 20 GAUGE MVR PORT FOR MACULAR HOLE WITH MEMBRANE PEEL;  Surgeon: Ronelle Coffee, MD;  Location: [facility] OR;  Service: Ophthalmology;  Laterality: Right;   BIOPSY  11/17/2019   Procedure: BIOPSY;  Surgeon: Vinetta Greening, DO;  [facility];  Service: Endoscopy;;   CATARACT EXTRACTION Right    CHOLECYSTECTOMY     COLONOSCOPY WITH PROPOFOL  N/A 11/17/2019   Surgeon: Vinetta Greening, DO; nonbleeding internal hemorrhoids, 13 mm tubular adenoma in the cecum, 11 mm tubular adenoma at the ileocecal valve, multiple large ascending unresectable colon polyps left behind. Referred to general suregery for right hemicolectomy. Repeat TCS in 1 year.   COLONOSCOPY WITH PROPOFOL  N/A 05/27/2021   Procedure: COLONOSCOPY WITH PROPOFOL ;  Surgeon: Vinetta Greening, DO;  [facility];  Service: Endoscopy;  Laterality: N/A;  1:45pm   ESOPHAGOGASTRODUODENOSCOPY (EGD) WITH PROPOFOL  N/A 11/17/2019   Surgeon: Vinetta Greening, DO;  small hiatal hernia, grade a reflux esophagitis, gastritis biopsied, normal examined duodenum.  Pathology with mild nonspecific reactive gastropathy, negative for H. pylori.   EYE SURGERY Right 04/20/2019   [facility] repair - PPV/MP - Dr. Ronelle Coffee   GALLBLADDER SURGERY     GAS/FLUID EXCHANGE Right 04/20/2019   Procedure: Gas/Fluid Exchange;  Surgeon: Ronelle Coffee, MD;  Location: [facility] OR;  Service: Ophthalmology;   Laterality: Right;   HEMOSTASIS CLIP PLACEMENT  11/17/2019   Procedure: HEMOSTASIS CLIP PLACEMENT;  Surgeon: Vinetta Greening, DO;  [facility];  Service: Endoscopy;;   PARTIAL COLECTOMY N/A 12/25/2019   Procedure: PARTIAL COLECTOMY;  Surgeon: Alanda Allegra, MD;  Location: [facility];  Service: General;  Laterality: N/A;   PHOTOCOAGULATION WITH LASER Right 04/20/2019   Procedure: Photocoagulation With Laser;  Surgeon: Ronelle Coffee, MD;  Location: [facility] OR;  Service: Ophthalmology;  Laterality: Right;   POLYPECTOMY  11/17/2019   Procedure: POLYPECTOMY;  Surgeon: Vinetta Greening, DO;  [facility];  Service: Endoscopy;;   POLYPECTOMY  05/27/2021   Procedure: POLYPECTOMY;  Surgeon: Vinetta Greening, DO;  [facility];  Service: Endoscopy;;   TUBAL LIGATION     FAMILY HISTORY Family History  Problem Relation Age of Onset   Heart attack Maternal Grandmother    Cancer Maternal Grandfather    Cancer Father        lung   Other Mother        irregular heart beat   Hypertension Mother    Cancer Brother        lung, brain   Diabetes Brother    Hypertension Brother    High Cholesterol Brother    Atrial fibrillation Brother    Melanoma Brother        right ear   Hypertension Sister    High Cholesterol Sister    Colon cancer Sister 26   Rheum arthritis Sister    Other Sister        irregular heart beat   Hypertension Son    Colon cancer Maternal Aunt    Colon cancer Maternal Uncle    Cancer Other        maternal side   Stroke Other        maternal side   SOCIAL HISTORY Social History   Tobacco Use   Smoking status: Never   Smokeless tobacco: Never  Vaping Use   Vaping status: Never Used  Substance Use Topics   Alcohol use: No    Alcohol/week: 0.0 standard drinks of alcohol   Drug use: No       OPHTHALMIC EXAM:  Not recorded    IMAGING AND PROCEDURES  Imaging and Procedures for @TODAY @          ASSESSMENT/PLAN:  No diagnosis  found.  1. Full thickness macular hole OD  - s/p PPV/TissueBlue  stain/MP/14% C3F8 OD, 03.11.21             - doing well             - mac hole closed  - BCVA now 20/20 OD post cataract surgery  - monitor  2,3. Hypertensive retinopathy OU  - discussed importance of tight BP control  - monitor  4. 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  -  discussed diagnosis and progression  - under the expert management of Dr. Anthoney Kipper  5. Pseudophakia OD  - s/p CE/IOL OD (Dr. Anthoney Kipper, 07.08.21)  - IOL in excellent position, doing well  - monitor  6. VMT OS -- resolved / released as of May 2022  - OCT 05.15.24 shows residual partial PVD  - f/u 1 yr, sooner prn -- DFE, OCT  Ophthalmic Meds Ordered this visit:  No orders of the defined types were placed in this encounter.    No follow-ups on file.  There are no Patient Instructions on file for this visit.  This document serves as a record of services personally performed by Jeanice Millard, MD, PhD. It was created on their behalf by Angelia Kelp, an ophthalmic technician. The creation of this record is the provider's dictation and/or activities during the visit.    Electronically signed by: Angelia Kelp, OA, 06/09/23  10:15 AM    Jeanice Millard, M.D., Ph.D. Diseases & Surgery of the Retina and Vitreous [facility]   Abbreviations: M myopia (nearsighted); A astigmatism; H hyperopia (farsighted); P presbyopia; Mrx spectacle prescription;  CTL contact lenses; OD right eye; OS left eye; OU both eyes  XT exotropia; ET esotropia; PEK punctate epithelial keratitis; PEE punctate epithelial erosions; DES dry eye syndrome; MGD meibomian gland dysfunction; ATs artificial tears; PFAT's preservative free artificial tears; NSC nuclear sclerotic cataract; PSC posterior subcapsular cataract; ERM epi-retinal membrane; PVD  posterior vitreous detachment; RD retinal detachment; DM diabetes mellitus; DR diabetic retinopathy; NPDR non-proliferative diabetic retinopathy; PDR proliferative diabetic retinopathy; CSME clinically significant macular edema; DME diabetic macular edema; dbh dot blot hemorrhages; CWS cotton wool spot; POAG primary open angle glaucoma; C/D cup-to-disc ratio; HVF humphrey visual field; GVF goldmann visual field; OCT optical coherence tomography; IOP intraocular pressure; BRVO Branch retinal vein occlusion; CRVO central retinal vein occlusion; CRAO central retinal artery occlusion; BRAO branch retinal artery occlusion; RT retinal tear; SB scleral buckle; PPV pars plana vitrectomy; VH Vitreous hemorrhage; PRP panretinal laser photocoagulation; IVK intravitreal kenalog ; VMT vitreomacular traction; MH Macular hole;  NVD neovascularization of the disc; NVE neovascularization elsewhere; AREDS age related eye disease study; ARMD age related macular degeneration; POAG primary open angle glaucoma; EBMD epithelial/anterior basement membrane dystrophy; ACIOL anterior chamber intraocular lens; IOL intraocular lens; PCIOL posterior chamber intraocular lens; Phaco/IOL phacoemulsification with intraocular lens placement; PRK photorefractive keratectomy; LASIK laser assisted in situ keratomileusis; HTN hypertension; DM diabetes mellitus; COPD chronic obstructive pulmonary disease

## 2023-06-23 ENCOUNTER — Other Ambulatory Visit: Payer: Self-pay | Admitting: Internal Medicine

## 2023-06-23 ENCOUNTER — Encounter (INDEPENDENT_AMBULATORY_CARE_PROVIDER_SITE_OTHER): Payer: Medicare HMO | Admitting: Ophthalmology

## 2023-06-23 ENCOUNTER — Encounter (INDEPENDENT_AMBULATORY_CARE_PROVIDER_SITE_OTHER): Payer: Self-pay

## 2023-06-23 DIAGNOSIS — I10 Essential (primary) hypertension: Secondary | ICD-10-CM

## 2023-06-23 DIAGNOSIS — H35341 Macular cyst, hole, or pseudohole, right eye: Secondary | ICD-10-CM

## 2023-06-23 DIAGNOSIS — H25812 Combined forms of age-related cataract, left eye: Secondary | ICD-10-CM

## 2023-06-23 DIAGNOSIS — Z961 Presence of intraocular lens: Secondary | ICD-10-CM

## 2023-06-23 DIAGNOSIS — H35033 Hypertensive retinopathy, bilateral: Secondary | ICD-10-CM

## 2023-06-23 DIAGNOSIS — H43822 Vitreomacular adhesion, left eye: Secondary | ICD-10-CM

## 2023-06-30 NOTE — Progress Notes (Signed)
 Referring Provider: Emaline Handsome, MD Primary Care Physician:  Emaline Handsome, MD Primary GI Physician: Dr. Mordechai April  Chief Complaint  Patient presents with   yearly check up    Yearly follow up. No problems     HPI:   Catherine Carney is a 73 y.o. female presenting today for follow-up.  She has history of GERD, numerous adenomatous colon polyps including multiple large polyps in the ascending colon that could not be resected s/p right hemicolectomy in November 2021.   Last colonoscopy 05/27/2021 with 1 sessile serrated polyp descending colon. Recommended 5-year recall if benefits outweigh risk.    Today:  GERD remains well-controlled on omeprazole  40 mg daily.  No nausea, vomiting, abdominal pain, dysphagia.  No dental concerns.  Denies BRBPR or melena.     Past Medical History:  Diagnosis Date   Cataract    Combined form OU   Family history of adverse reaction to anesthesia    sister had PONV   Female bladder prolapse    Hyperlipidemia    Hypertension    Hypertensive retinopathy    OU   Macular hole of right eye    Osteoarthritis     Past Surgical History:  Procedure Laterality Date   25 GAUGE PARS PLANA VITRECTOMY WITH 20 GAUGE MVR PORT FOR MACULAR HOLE Right 04/20/2019   Procedure: 25 GAUGE PARS PLANA VITRECTOMY WITH 20 GAUGE MVR PORT FOR MACULAR HOLE WITH MEMBRANE PEEL;  Surgeon: Ronelle Coffee, MD;  Location: [facility] OR;  Service: Ophthalmology;  Laterality: Right;   BIOPSY  11/17/2019   Procedure: BIOPSY;  Surgeon: Vinetta Greening, DO;  [facility];  Service: Endoscopy;;   CATARACT EXTRACTION Right    CHOLECYSTECTOMY     COLONOSCOPY WITH PROPOFOL  N/A 11/17/2019   Surgeon: Vinetta Greening, DO; nonbleeding internal hemorrhoids, 13 mm tubular adenoma in the cecum, 11 mm tubular adenoma at the ileocecal valve, multiple large ascending unresectable colon polyps left behind. Referred to general suregery for right hemicolectomy. Repeat TCS in 1 year.    COLONOSCOPY WITH PROPOFOL  N/A 05/27/2021   Procedure: COLONOSCOPY WITH PROPOFOL ;  Surgeon: Vinetta Greening, DO;  [facility];  Service: Endoscopy;  Laterality: N/A;  1:45pm   ESOPHAGOGASTRODUODENOSCOPY (EGD) WITH PROPOFOL  N/A 11/17/2019   Surgeon: Vinetta Greening, DO;  small hiatal hernia, grade a reflux esophagitis, gastritis biopsied, normal examined duodenum.  Pathology with mild nonspecific reactive gastropathy, negative for H. pylori.   EYE SURGERY Right 04/20/2019   [facility] repair - PPV/MP - Dr. Ronelle Coffee   GALLBLADDER SURGERY     GAS/FLUID EXCHANGE Right 04/20/2019   Procedure: Gas/Fluid Exchange;  Surgeon: Ronelle Coffee, MD;  Location: [facility] OR;  Service: Ophthalmology;  Laterality: Right;   HEMOSTASIS CLIP PLACEMENT  11/17/2019   Procedure: HEMOSTASIS CLIP PLACEMENT;  Surgeon: Vinetta Greening, DO;  [facility];  Service: Endoscopy;;   PARTIAL COLECTOMY N/A 12/25/2019   Procedure: PARTIAL COLECTOMY;  Surgeon: Alanda Allegra, MD;  Location: [facility];  Service: General;  Laterality: N/A;   PHOTOCOAGULATION WITH LASER Right 04/20/2019   Procedure: Photocoagulation With Laser;  Surgeon: Ronelle Coffee, MD;  Location: [facility] OR;  Service: Ophthalmology;  Laterality: Right;   POLYPECTOMY  11/17/2019   Procedure: POLYPECTOMY;  Surgeon: Vinetta Greening, DO;  [facility];  Service: Endoscopy;;   POLYPECTOMY  05/27/2021   Procedure: POLYPECTOMY;  Surgeon: Vinetta Greening, DO;  [facility];  Service: Endoscopy;;  TUBAL LIGATION      Current Outpatient Medications  Medication Sig Dispense Refill   acetaminophen  (TYLENOL ) 500 MG tablet Take 500 mg by mouth every 6 (six) hours as needed (for pain.).      amLODipine (NORVASC) 5 MG tablet Take 5 mg by mouth.     ARTIFICIAL TEAR SOLUTION OP Place 1 drop into both eyes daily as needed (dry eyes).     aspirin EC 81 MG tablet Take 81 mg by mouth daily.     Calcium  Carb-Cholecalciferol (CALCIUM  600+D)  600-800 MG-UNIT TABS Take 1 tablet by mouth daily.     cholecalciferol (VITAMIN D3) 25 MCG (1000 UNIT) tablet Take 1,000 Units by mouth daily.     Coenzyme Q10 (COQ10) 100 MG CAPS Take 100 mg by mouth daily.     metoprolol  succinate (TOPROL -XL) 50 MG 24 hr tablet Take 50 mg by mouth daily.     Multiple Vitamin (MULTIVITAMIN WITH MINERALS) TABS tablet Take 1 tablet by mouth daily. Women's Multivitamin 50+     nystatin  cream (MYCOSTATIN ) Apply 1 Application topically 2 (two) times daily as needed for dry skin. 30 g PRN   Omega 3-6-9 Fatty Acids (TRIPLE OMEGA-3-6-9 PO) Take 1 capsule by mouth daily.     omeprazole  (PRILOSEC) 40 MG capsule Take 1 capsule by mouth once daily 90 capsule 0   rosuvastatin  (CRESTOR ) 20 MG tablet Take 20 mg by mouth at bedtime.     valsartan (DIOVAN) 320 MG tablet Take 320 mg by mouth daily.     vitamin B-12 (CYANOCOBALAMIN) 500 MCG tablet Take 500 mcg by mouth daily.     No current facility-administered medications for this visit.    Allergies as of 07/01/2023   (No Known Allergies)    Family History  Problem Relation Age of Onset   Heart attack Maternal Grandmother    Cancer Maternal Grandfather    Cancer Father        lung   Other Mother        irregular heart beat   Hypertension Mother    Cancer Brother        lung, brain   Diabetes Brother    Hypertension Brother    High Cholesterol Brother    Atrial fibrillation Brother    Melanoma Brother        right ear   Hypertension Sister    High Cholesterol Sister    Colon cancer Sister 48   Rheum arthritis Sister    Other Sister        irregular heart beat   Hypertension Son    Colon cancer Maternal Aunt    Colon cancer Maternal Uncle    Cancer Other        maternal side   Stroke Other        maternal side    Social History   Socioeconomic History   Marital status: Married    Spouse name: Not on file   Number of children: Not on file   Years of education: Not on file   Highest education  level: Not on file  Occupational History   Not on file  Tobacco Use   Smoking status: Never   Smokeless tobacco: Never  Vaping Use   Vaping status: Never Used  Substance and Sexual Activity   Alcohol use: No    Alcohol/week: 0.0 standard drinks of alcohol   Drug use: No   Sexual activity: Not Currently    Birth control/protection: Surgical, Post-menopausal  Comment: tubal  Other Topics Concern   Not on file  Social History Narrative   Not on file   Social Drivers of Health   Financial Resource Strain: Low Risk  (05/31/2023)   Received from [facility]   Overall Financial Resource Strain (CARDIA)    Difficulty of Paying Living Expenses: Not hard at all  Food Insecurity: No Food Insecurity (05/31/2023)   Received from [facility]   Hunger Vital Sign    Worried About Running Out of Food in the Last Year: Never true    Ran Out of Food in the Last Year: Never true  Transportation Needs: No Transportation Needs (05/31/2023)   Received from [facility] - Transportation    Lack of Transportation (Medical): No    Lack of Transportation (Non-Medical): No  Physical Activity: Sufficiently Active (05/31/2023)   Received from [facility]   Exercise Vital Sign    Days of Exercise per Week: 5 days    Minutes of Exercise per Session: 30 min  Stress: No Stress Concern Present (06/08/2023)   Received from [facility] - Occupational Stress Questionnaire    Feeling of Stress : Not at all  Social Connections: Moderately Integrated (05/31/2023)   Received from [facility]   Social Network    How would you rate your social network (family, work, friends)?: Adequate participation with social networks    Review of Systems: Gen: Denies fever, chills, cold or flulike symptoms, presyncope, syncope. CV: Denies chest pain, palpitations. Resp: Denies dyspnea, cough. GI: See HPI Heme: See HPI  Physical Exam: BP 135/73 (BP  Location: Right Arm, Patient Position: Sitting, Cuff Size: Large)   Pulse 62   Temp 97.7 F (36.5 C) (Temporal)   Ht 5\' 6"  (1.676 m)   Wt 197 lb (89.4 kg)   BMI 31.80 kg/m  General:   Alert and oriented. No distress noted. Pleasant and cooperative.  Head:  Normocephalic and atraumatic. Eyes:  Conjuctiva clear without scleral icterus. Heart:  S1, S2 present without murmurs appreciated. Lungs:  Clear to auscultation bilaterally. No wheezes, rales, or rhonchi. No distress.  Abdomen:  +BS, soft, non-tender and non-distended. No rebound or guarding. No HSM or masses noted. Msk:  Symmetrical without gross deformities. Normal posture. Extremities:  Without edema. Neurologic:  Alert and  oriented x4 Psych:  Normal mood and affect.    Assessment:  73 year old female presenting today for follow-up of GERD.  Symptoms are well-controlled on omeprazole  40 mg daily.  No alarm symptoms.  No other significant GI symptoms.  Regarding her history of adenomatous colon polyps, she is up-to-date on surveillance.  Next colonoscopy due in 2028 if benefits outweigh risk.   Plan:  Continue omeprazole  40 mg daily.   Follow-up in 1 year or sooner if needed.   Shana Daring, PA-C [facility] 07/01/2023

## 2023-07-01 ENCOUNTER — Ambulatory Visit: Admitting: Gastroenterology

## 2023-07-01 ENCOUNTER — Encounter: Payer: Self-pay | Admitting: Gastroenterology

## 2023-07-01 VITALS — BP 135/73 | HR 62 | Temp 97.7°F | Ht 66.0 in | Wt 197.0 lb

## 2023-07-01 DIAGNOSIS — K219 Gastro-esophageal reflux disease without esophagitis: Secondary | ICD-10-CM

## 2023-07-01 DIAGNOSIS — Z860101 Personal history of adenomatous and serrated colon polyps: Secondary | ICD-10-CM

## 2023-07-01 NOTE — Patient Instructions (Signed)
 Continue omeprazole  40 mg daily 30 minutes before breakfast.  We will see you back in 1 year or sooner if needed.   It was good to see you today!   Shana Daring, PA-C [facility]

## 2023-07-01 NOTE — Progress Notes (Signed)
 [facility] - Clinic Note  07/06/2023    CHIEF COMPLAINT Patient presents for Retina Follow Up  HISTORY OF PRESENT ILLNESS: Catherine Carney is a 73 y.o. female who presents to the clinic today for:   HPI     Retina Follow Up   In right eye.  This started 1 year ago.  Duration of 1 year.  Since onset it is stable.        Comments   1 year retina follow up [facility] OD pt is reporting no vision changes noticed she denies any flashes has some floaters       Last edited by Alise Appl, COT on 07/06/2023  9:36 AM.     Patient is to see Dr. Candi Chafe at the end of the year. She feels the vision is about the same.   Referring physician: Emaline Handsome, MD [address] [place],  [place] [address]  HISTORICAL INFORMATION:   Selected notes from the MEDICAL RECORD NUMBER Referred by Dr Micael Adas for retina eval   CURRENT MEDICATIONS: Current Outpatient Medications (Ophthalmic Drugs)  Medication Sig   ARTIFICIAL TEAR SOLUTION OP Place 1 drop into both eyes daily as needed (dry eyes).   No current facility-administered medications for this visit. (Ophthalmic Drugs)   Current Outpatient Medications (Other)  Medication Sig   acetaminophen  (TYLENOL ) 500 MG tablet Take 500 mg by mouth every 6 (six) hours as needed (for pain.).    amLODipine (NORVASC) 5 MG tablet Take 5 mg by mouth.   aspirin EC 81 MG tablet Take 81 mg by mouth daily.   Calcium  Carb-Cholecalciferol (CALCIUM  600+D) 600-800 MG-UNIT TABS Take 1 tablet by mouth daily.   cholecalciferol (VITAMIN D3) 25 MCG (1000 UNIT) tablet Take 1,000 Units by mouth daily.   Coenzyme Q10 (COQ10) 100 MG CAPS Take 100 mg by mouth daily.   metoprolol  succinate (TOPROL -XL) 50 MG 24 hr tablet Take 50 mg by mouth daily.   Multiple Vitamin (MULTIVITAMIN WITH MINERALS) TABS tablet Take 1 tablet by mouth daily. Women's Multivitamin 50+   nystatin  cream (MYCOSTATIN ) Apply 1 Application topically 2 (two) times  daily as needed for dry skin.   Omega 3-6-9 Fatty Acids (TRIPLE OMEGA-3-6-9 PO) Take 1 capsule by mouth daily.   omeprazole  (PRILOSEC) 40 MG capsule Take 1 capsule by mouth once daily   rosuvastatin  (CRESTOR ) 20 MG tablet Take 20 mg by mouth at bedtime.   valsartan (DIOVAN) 320 MG tablet Take 320 mg by mouth daily.   vitamin B-12 (CYANOCOBALAMIN) 500 MCG tablet Take 500 mcg by mouth daily.   No current facility-administered medications for this visit. (Other)   REVIEW OF SYSTEMS: ROS   Positive for: Gastrointestinal, Musculoskeletal, Eyes Negative for: Constitutional, Neurological, Skin, Genitourinary, HENT, Endocrine, Cardiovascular, Respiratory, Psychiatric, Allergic/Imm, Heme/Lymph Last edited by Alise Appl, COT on 07/06/2023  9:36 AM.       ALLERGIES No Known Allergies  PAST MEDICAL HISTORY Past Medical History:  Diagnosis Date   Cataract    Combined form OU   Family history of adverse reaction to anesthesia    sister had PONV   Female bladder prolapse    Hyperlipidemia    Hypertension    Hypertensive retinopathy    OU   Macular hole of right eye    Osteoarthritis    Past Surgical History:  Procedure Laterality Date   25 GAUGE PARS PLANA VITRECTOMY WITH 20 GAUGE MVR PORT FOR MACULAR HOLE Right 04/20/2019  Procedure: 25 GAUGE PARS PLANA VITRECTOMY WITH 20 GAUGE MVR PORT FOR MACULAR HOLE WITH MEMBRANE PEEL;  Surgeon: Ronelle Coffee, MD;  Location: [facility] OR;  Service: Ophthalmology;  Laterality: Right;   BIOPSY  11/17/2019   Procedure: BIOPSY;  Surgeon: Vinetta Greening, DO;  Location: [facility];  Service: Endoscopy;;   CATARACT EXTRACTION Right    CHOLECYSTECTOMY     COLONOSCOPY WITH PROPOFOL  N/A 11/17/2019   Surgeon: Vinetta Greening, DO; nonbleeding internal hemorrhoids, 13 mm tubular adenoma in the cecum, 11 mm tubular adenoma at the ileocecal valve, multiple large ascending unresectable colon polyps left behind. Referred to general suregery for right  hemicolectomy. Repeat TCS in 1 year.   COLONOSCOPY WITH PROPOFOL  N/A 05/27/2021   Procedure: COLONOSCOPY WITH PROPOFOL ;  Surgeon: Vinetta Greening, DO;  Location: [facility];  Service: Endoscopy;  Laterality: N/A;  1:45pm   ESOPHAGOGASTRODUODENOSCOPY (EGD) WITH PROPOFOL  N/A 11/17/2019   Surgeon: Vinetta Greening, DO;  small hiatal hernia, grade a reflux esophagitis, gastritis biopsied, normal examined duodenum.  Pathology with mild nonspecific reactive gastropathy, negative for H. pylori.   EYE SURGERY Right 04/20/2019   [facility] repair - PPV/MP - Dr. Ronelle Coffee   GALLBLADDER SURGERY     GAS/FLUID EXCHANGE Right 04/20/2019   Procedure: Gas/Fluid Exchange;  Surgeon: Ronelle Coffee, MD;  Location: [facility] OR;  Service: Ophthalmology;  Laterality: Right;   HEMOSTASIS CLIP PLACEMENT  11/17/2019   Procedure: HEMOSTASIS CLIP PLACEMENT;  Surgeon: Vinetta Greening, DO;  Location: [facility];  Service: Endoscopy;;   PARTIAL COLECTOMY N/A 12/25/2019   Procedure: PARTIAL COLECTOMY;  Surgeon: Alanda Allegra, MD;  Location: [facility];  Service: General;  Laterality: N/A;   PHOTOCOAGULATION WITH LASER Right 04/20/2019   Procedure: Photocoagulation With Laser;  Surgeon: Ronelle Coffee, MD;  Location: [facility] OR;  Service: Ophthalmology;  Laterality: Right;   POLYPECTOMY  11/17/2019   Procedure: POLYPECTOMY;  Surgeon: Vinetta Greening, DO;  Location: [facility];  Service: Endoscopy;;   POLYPECTOMY  05/27/2021   Procedure: POLYPECTOMY;  Surgeon: Vinetta Greening, DO;  Location: [facility];  Service: Endoscopy;;   TUBAL LIGATION     FAMILY HISTORY Family History  Problem Relation Age of Onset   Heart attack Maternal Grandmother    Cancer Maternal Grandfather    Cancer Father        lung   Other Mother        irregular heart beat   Hypertension Mother    Cancer Brother        lung, brain   Diabetes Brother    Hypertension Brother    High Cholesterol Brother    Atrial fibrillation Brother    Melanoma  Brother        right ear   Hypertension Sister    High Cholesterol Sister    Colon cancer Sister 91   Rheum arthritis Sister    Other Sister        irregular heart beat   Hypertension Son    Colon cancer Maternal Aunt    Colon cancer Maternal Uncle    Cancer Other        maternal side   Stroke Other        maternal side   SOCIAL HISTORY Social History   Tobacco Use   Smoking status: Never   Smokeless tobacco: Never  Vaping Use   Vaping status: Never Used  Substance Use Topics   Alcohol use: No    Alcohol/week:  0.0 standard drinks of alcohol   Drug use: No       OPHTHALMIC EXAM:  Base Eye Exam     Visual Acuity (Snellen - Linear)       Right Left   Dist cc 20/20 -1 20/20 -1    Correction: Glasses         Tonometry (Tonopen, 9:43 AM)       Right Left   Pressure 18 18         Pupils       Pupils Dark Light Shape React APD   Right PERRL 3 2 Round Brisk None   Left PERRL 3 2 Round Brisk None         Visual Fields       Left Right    Full Full         Extraocular Movement       Right Left    Full, Ortho Full, Ortho         Neuro/Psych     Oriented x3: Yes   Mood/Affect: Normal         Dilation     Both eyes: 2.5% Phenylephrine  @ 9:43 AM           Slit Lamp and Fundus Exam     External Exam       Right Left   External Normal          Slit Lamp Exam       Right Left   Lids/Lashes Dermatochalasis - upper lid, mild MGD Dermatochalasis - upper lid, Meibomian gland dysfunction   Conjunctiva/Sclera White and quiet White and quiet   Cornea Trace Punctate epithelial erosions, well healed temporal cataract wounds trace tear film debris   Anterior Chamber deep, clear, narrow temporal angle Deep and quiet   Iris Round and dilated Round and dilated   Lens PC IOL in good position with open PC 2+ Nuclear sclerosis, 2+ Cortical cataract   Anterior Vitreous post vitrectomy, clear Vitreous syneresis         Fundus Exam        Right Left   Disc mild Pallor, Sharp rim, mild PPA/PPP, +cupping Pink and Sharp, mild PPA/PPP   C/D Ratio 0.65 0.4   Macula flat; mac hole closed, Blunted foveal reflex, mild RPE mottling, No heme or edema Flat, good foveal reflex, mild RPE mottling, central cystic changes - resolved, No heme or edema   Vessels mild attenuation, mild tortuosity mild attenuation, mild tortuosity, mild AV crossing changes   Periphery attached, good 360 laser changes, No heme Attached, no heme           Refraction     Wearing Rx       Sphere Cylinder Axis Add   Right +0.00 +0.75 170 +2.75   Left +1.00 +0.75 011 +2.75           IMAGING AND PROCEDURES  Imaging and Procedures for @TODAY @  OCT, Retina - OU - Both Eyes        Right Eye Quality was good. Central Foveal Thickness: 298. Progression has been stable. Findings include no IRF, no SRF, abnormal foveal contour (Mac hole closed, irregular inner retinal surface).   Left Eye Quality was good. Central Foveal Thickness: 274. Progression has been stable. Findings include no IRF, no SRF, abnormal foveal contour (Stable improvement in central cystic changes, partial PVD).   Notes  *Images captured and stored on drive  Diagnosis / Impression:  OD: Mac hole  closed; irregular inner retinal surface; no IRF/SRF OS: Stable improvement in central cystic changes, partial PVD  Clinical management:  See below  Abbreviations: NFP - Normal foveal profile. CME - cystoid macular edema. PED - pigment epithelial detachment. IRF - intraretinal fluid. SRF - subretinal fluid. EZ - ellipsoid zone. ERM - epiretinal membrane. ORA - outer retinal atrophy. ORT - outer retinal tubulation. SRHM - subretinal hyper-reflective material             ASSESSMENT/PLAN:    ICD-10-CM   1. Macular hole of right eye  H35.341 OCT, Retina - OU - Both Eyes    2. Essential hypertension  I10     3. Hypertensive retinopathy of both eyes  H35.033     4. Combined  forms of age-related cataract of left eye  H25.812     5. Pseudophakia  Z96.1     6. Vitreomacular traction, left  H43.822       1. Full thickness macular hole OD  - s/p PPV/TissueBlue  stain/MP/14% C3F8 OD, 03.11.21             - doing well             - mac hole closed  - BCVA now 20/20 OD post cataract surgery  - monitor  - f/u 1 year DFE, OCT  2,3. Hypertensive retinopathy OU  - discussed importance of tight BP control  - monitor  4. Mixed form age related cataract OS - The symptoms of cataract, surgical options, and treatments and risks were discussed with patient.  - discussed diagnosis and progression  - under the expert management of Dr. Anthoney Kipper  5. Pseudophakia OD  - s/p CE/IOL OD (Dr. Anthoney Kipper, 07.08.21)  - IOL in excellent position, doing well  - monitor  6. VMT OS -- resolved / released as of May 2022  - OCT 05.15.24 shows residual partial PVD  - f/u 1 yr, sooner prn -- DFE, OCT  Ophthalmic Meds Ordered this visit:  No orders of the defined types were placed in this encounter.    No follow-ups on file.  There are no Patient Instructions on file for this visit. This document serves as a record of services personally performed by Jeanice Millard, MD, PhD. It was created on their behalf by Olene Berne, COT an ophthalmic technician. The creation of this record is the provider's dictation and/or activities during the visit.    Electronically signed by:  Olene Berne, COT  07/06/23 10:32 AM   Jeanice Millard, M.D., Ph.D. Diseases & Surgery of the Retina and Vitreous [facility]    Abbreviations: M myopia (nearsighted); A astigmatism; H hyperopia (farsighted); P presbyopia; Mrx spectacle prescription;  CTL contact lenses; OD right eye; OS left eye; OU both eyes  XT exotropia; ET esotropia; PEK punctate epithelial keratitis; PEE punctate epithelial erosions; DES dry eye syndrome; MGD meibomian gland dysfunction; ATs  artificial tears; PFAT's preservative free artificial tears; NSC nuclear sclerotic cataract; PSC posterior subcapsular cataract; ERM epi-retinal membrane; PVD posterior vitreous detachment; RD retinal detachment; DM diabetes mellitus; DR diabetic retinopathy; NPDR non-proliferative diabetic retinopathy; PDR proliferative diabetic retinopathy; CSME clinically significant macular edema; DME diabetic macular edema; dbh dot blot hemorrhages; CWS cotton wool spot; POAG primary open angle glaucoma; C/D cup-to-disc ratio; HVF humphrey visual field; GVF goldmann visual field; OCT optical coherence tomography; IOP intraocular pressure; BRVO Branch retinal vein occlusion; CRVO central retinal vein occlusion; CRAO central retinal artery occlusion;  BRAO branch retinal artery occlusion; RT retinal tear; SB scleral buckle; PPV pars plana vitrectomy; VH Vitreous hemorrhage; PRP panretinal laser photocoagulation; IVK intravitreal kenalog ; VMT vitreomacular traction; MH Macular hole;  NVD neovascularization of the disc; NVE neovascularization elsewhere; AREDS age related eye disease study; ARMD age related macular degeneration; POAG primary open angle glaucoma; EBMD epithelial/anterior basement membrane dystrophy; ACIOL anterior chamber intraocular lens; IOL intraocular lens; PCIOL posterior chamber intraocular lens; Phaco/IOL phacoemulsification with intraocular lens placement; PRK photorefractive keratectomy; LASIK laser assisted in situ keratomileusis; HTN hypertension; DM diabetes mellitus; COPD chronic obstructive pulmonary disease

## 2023-07-06 ENCOUNTER — Ambulatory Visit (INDEPENDENT_AMBULATORY_CARE_PROVIDER_SITE_OTHER): Admitting: Ophthalmology

## 2023-07-06 ENCOUNTER — Encounter (INDEPENDENT_AMBULATORY_CARE_PROVIDER_SITE_OTHER): Payer: Self-pay | Admitting: Ophthalmology

## 2023-07-06 DIAGNOSIS — H43822 Vitreomacular adhesion, left eye: Secondary | ICD-10-CM

## 2023-07-06 DIAGNOSIS — Z961 Presence of intraocular lens: Secondary | ICD-10-CM

## 2023-07-06 DIAGNOSIS — H35341 Macular cyst, hole, or pseudohole, right eye: Secondary | ICD-10-CM

## 2023-07-06 DIAGNOSIS — H25812 Combined forms of age-related cataract, left eye: Secondary | ICD-10-CM

## 2023-07-06 DIAGNOSIS — I10 Essential (primary) hypertension: Secondary | ICD-10-CM

## 2023-07-06 DIAGNOSIS — H35033 Hypertensive retinopathy, bilateral: Secondary | ICD-10-CM | POA: Diagnosis not present

## 2023-07-08 ENCOUNTER — Encounter (INDEPENDENT_AMBULATORY_CARE_PROVIDER_SITE_OTHER): Payer: Self-pay | Admitting: Ophthalmology

## 2023-09-15 IMAGING — CT CT HEAD W/O CM
4 series · 17 of 47 positions shown, 19 images · non-contrast
Comparison: None Available.

CLINICAL DATA: Headache.



[Series 2: head w o · axial · 0.42mm/px · z∈[-6,+114]mm · 7 of 32 slices shown, 9 images]
[im 4/32  brain]
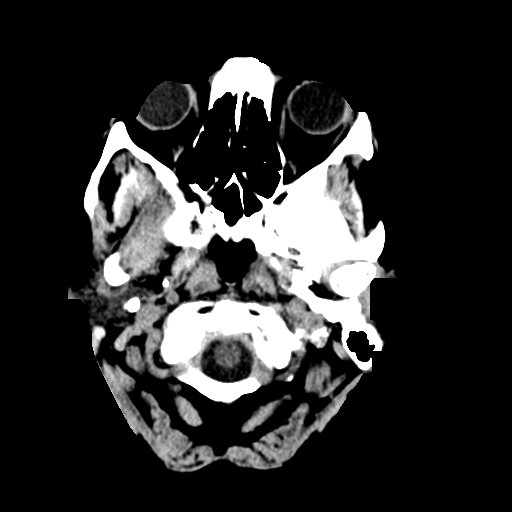
[im 4/32  bone]
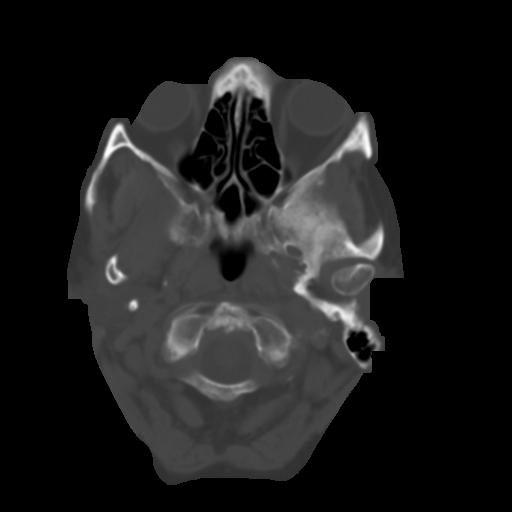
[im 8/32  brain]
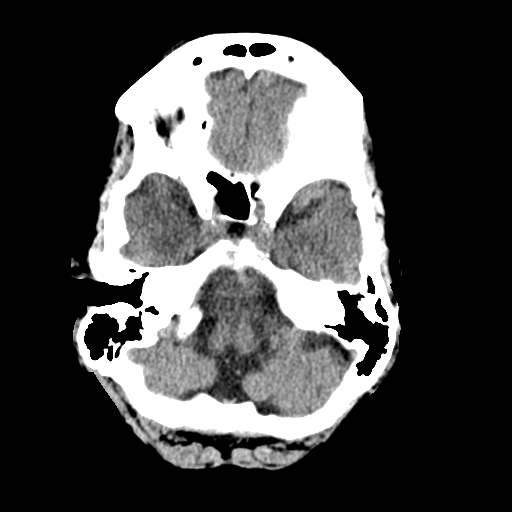
[im 12/32  brain]
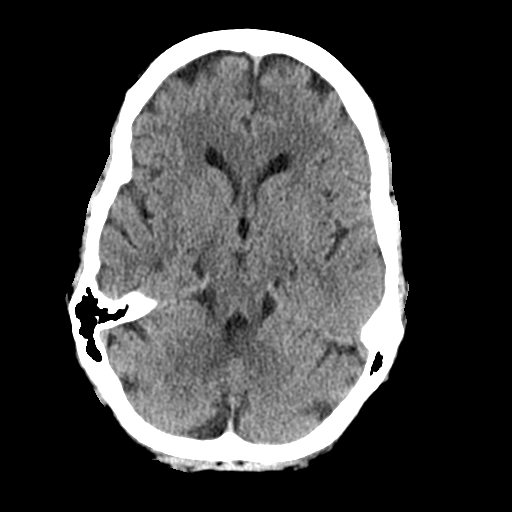
[im 16/32  brain]
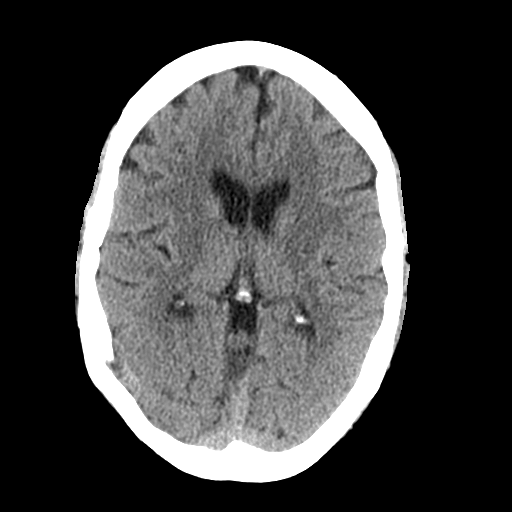
[im 20/32  brain]
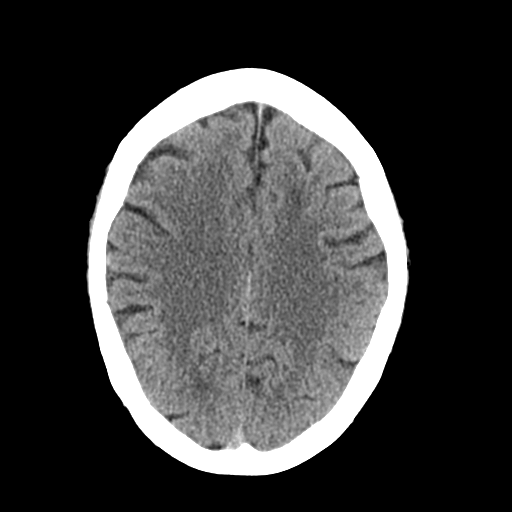
[im 20/32  bone]
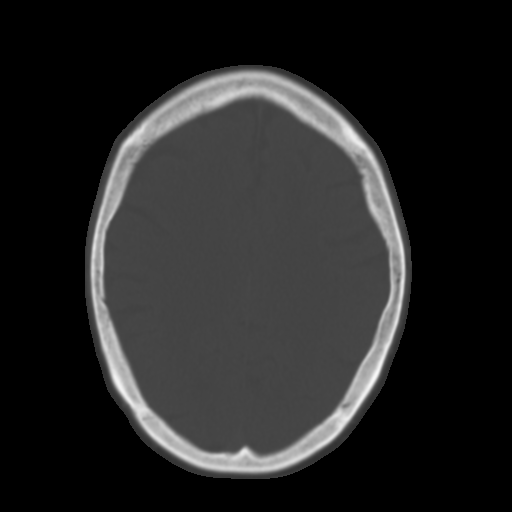
[im 24/32  brain]
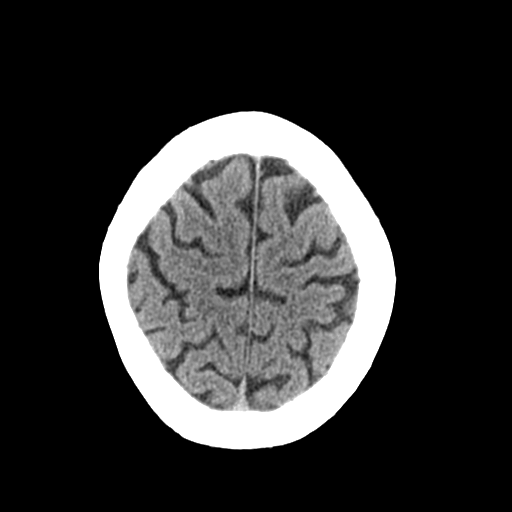
[im 28/32  brain]
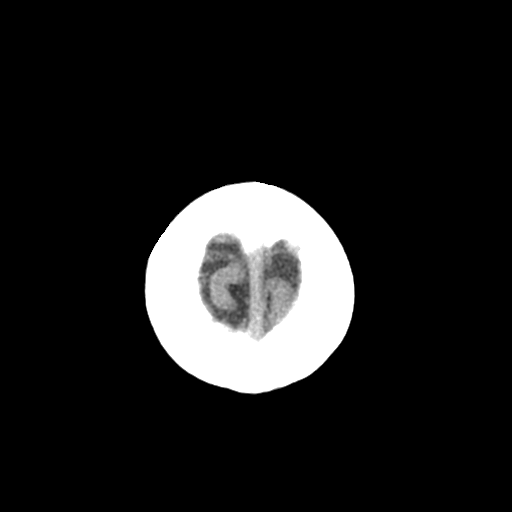

[Series 3: head bone · axial · 0.43mm/px · z∈[-7,+49]mm · 4 of 80 slices shown]
[im 8/80  bone]
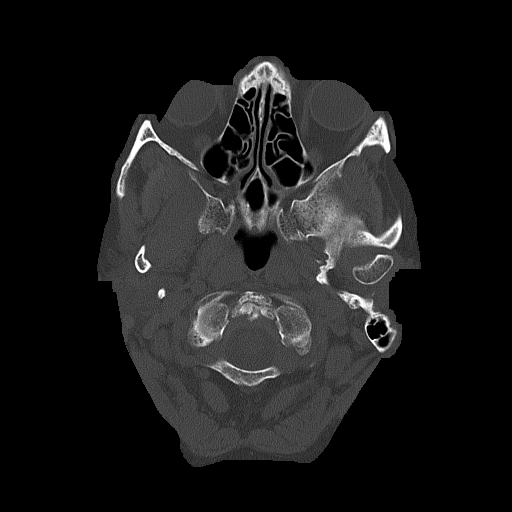
[im 16/80  bone]
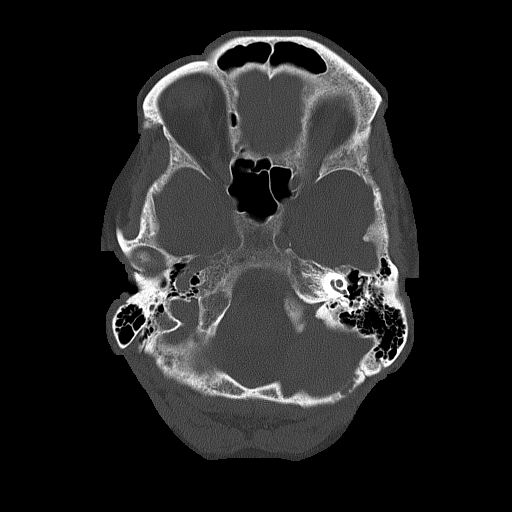
[im 24/80  bone]
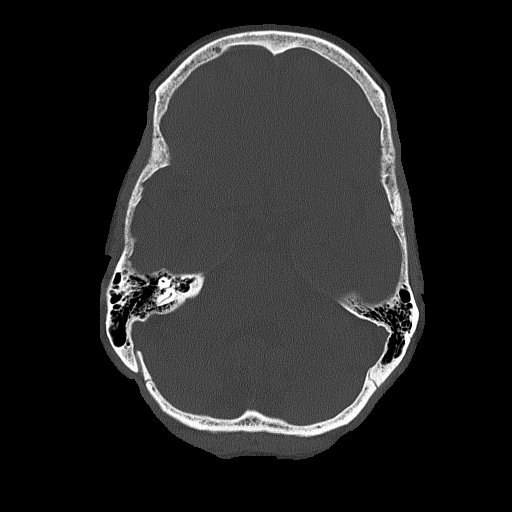
[im 36/80  bone]
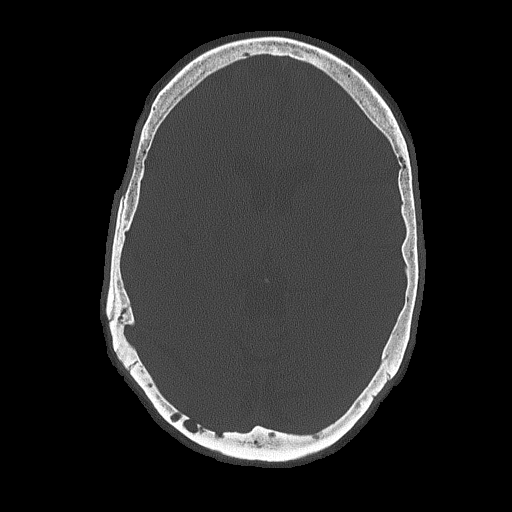

[Series 4: coronal soft · coronal · 0.35mm/px · 3 of 74 slices shown]
[im 25/74  brain]
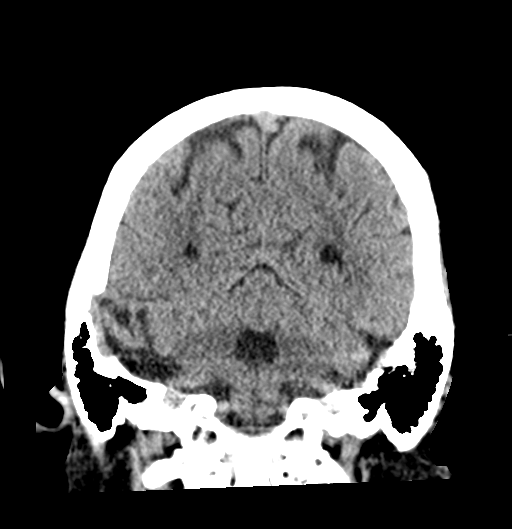
[im 33/74  brain]
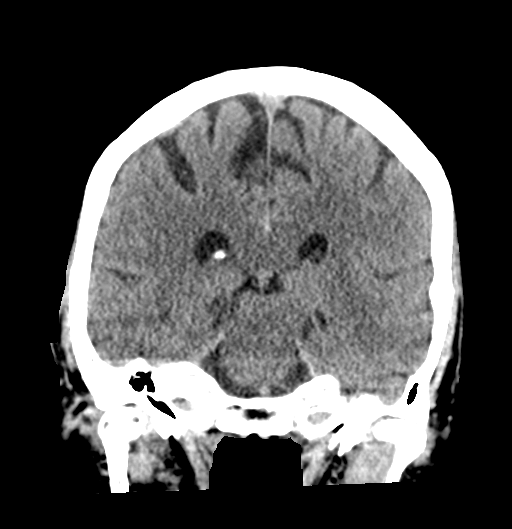
[im 41/74  brain]
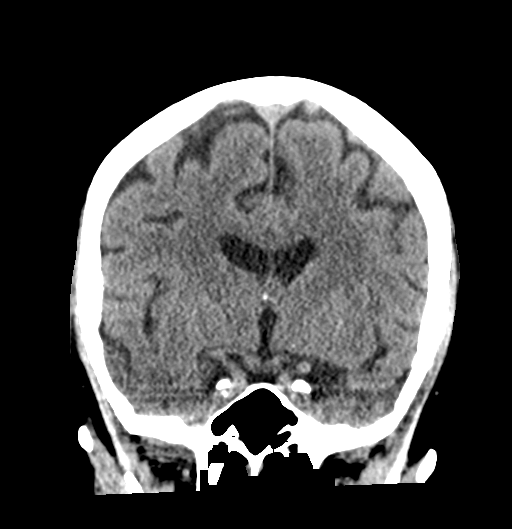

[Series 5: sagittal soft · sagittal · 0.36mm/px · 3 of 59 slices shown]
[im 20/59  brain]
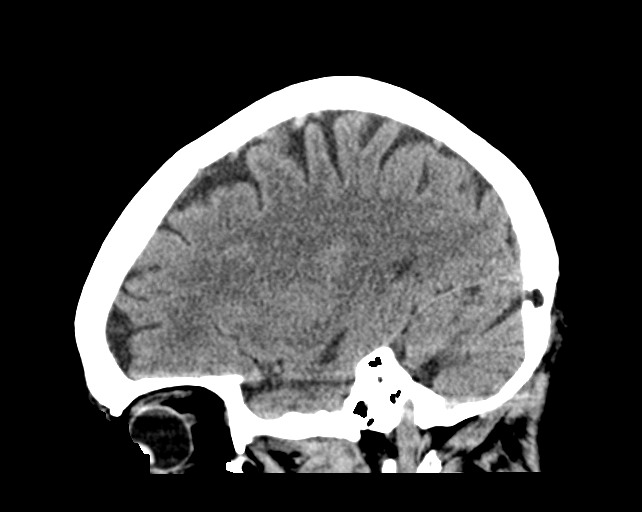
[im 30/59  brain]
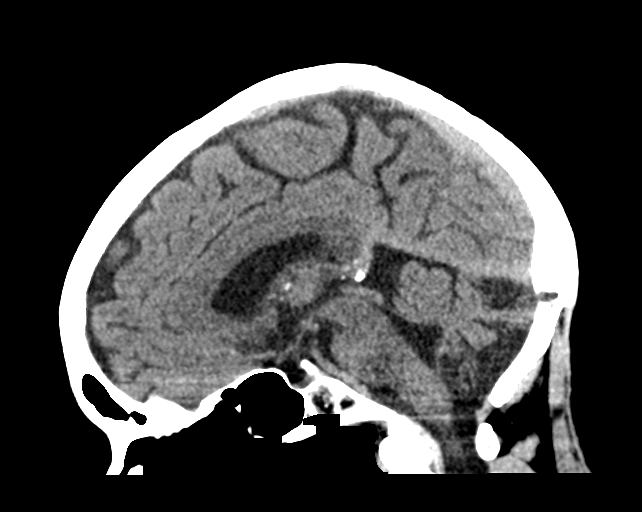
[im 39/59  brain]
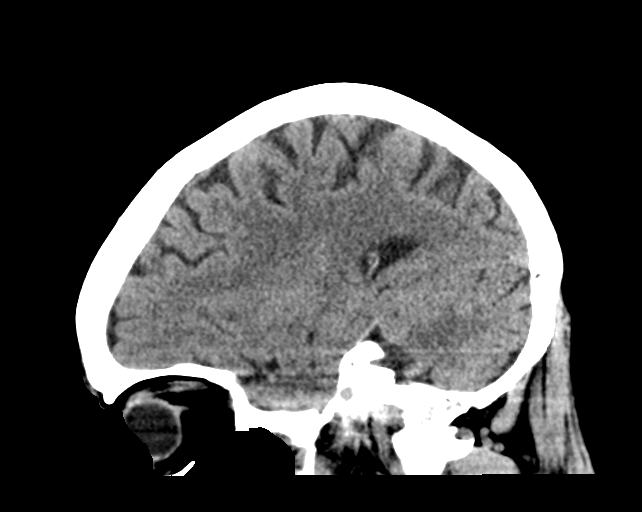

[17 of 47 positions shown; findings below may reference images not displayed]

FINDINGS: Brain: The ventricles and sulci are appropriate size for the
patient's age. The gray-white matter discrimination is preserved.
There is no acute intracranial hemorrhage. No mass effect or midline
shift. No extra-axial fluid collection.

Vascular: No hyperdense vessel or unexpected calcification.

Skull: Normal. Negative for fracture or focal lesion.

Sinuses/Orbits: No acute finding.

Other: None
IMPRESSION: No acute intracranial pathology.

## 2023-09-21 ENCOUNTER — Other Ambulatory Visit: Payer: Self-pay | Admitting: Internal Medicine

## 2024-07-04 ENCOUNTER — Encounter (INDEPENDENT_AMBULATORY_CARE_PROVIDER_SITE_OTHER): Admitting: Ophthalmology
